# Patient Record
Sex: Female | Born: 1950
Health system: Southern US, Community
[De-identification: ages and names within clinical notes are randomized; demographics above are authoritative.]

## PROBLEM LIST (undated history)

## (undated) DIAGNOSIS — R569 Unspecified convulsions: Secondary | ICD-10-CM

## (undated) DIAGNOSIS — F329 Major depressive disorder, single episode, unspecified: Secondary | ICD-10-CM

## (undated) DIAGNOSIS — F32A Depression, unspecified: Secondary | ICD-10-CM

## (undated) DIAGNOSIS — I10 Essential (primary) hypertension: Secondary | ICD-10-CM

## (undated) DIAGNOSIS — E079 Disorder of thyroid, unspecified: Secondary | ICD-10-CM

## (undated) DIAGNOSIS — E785 Hyperlipidemia, unspecified: Secondary | ICD-10-CM

## (undated) DIAGNOSIS — J449 Chronic obstructive pulmonary disease, unspecified: Secondary | ICD-10-CM

## (undated) HISTORY — DX: Unspecified convulsions: R56.9

## (undated) HISTORY — DX: Hyperlipidemia, unspecified: E78.5

## (undated) HISTORY — DX: Essential (primary) hypertension: I10

## (undated) HISTORY — PX: BRAIN SURGERY: SHX531

## (undated) HISTORY — DX: Disorder of thyroid, unspecified: E07.9

## (undated) HISTORY — DX: Major depressive disorder, single episode, unspecified: F32.9

## (undated) HISTORY — DX: Depression, unspecified: F32.A

## (undated) HISTORY — DX: Chronic obstructive pulmonary disease, unspecified: J44.9

---

## 2007-04-22 ENCOUNTER — Ambulatory Visit (HOSPITAL_COMMUNITY): Payer: Self-pay | Admitting: Psychiatry

## 2007-05-15 ENCOUNTER — Ambulatory Visit (HOSPITAL_COMMUNITY): Payer: Self-pay | Admitting: Psychiatry

## 2007-07-01 ENCOUNTER — Ambulatory Visit (HOSPITAL_COMMUNITY): Payer: Self-pay | Admitting: Psychiatry

## 2007-07-31 ENCOUNTER — Ambulatory Visit (HOSPITAL_COMMUNITY): Payer: Self-pay | Admitting: Psychiatry

## 2007-09-04 ENCOUNTER — Ambulatory Visit (HOSPITAL_COMMUNITY): Payer: Self-pay | Admitting: Psychiatry

## 2007-10-07 ENCOUNTER — Ambulatory Visit (HOSPITAL_COMMUNITY): Payer: Self-pay | Admitting: Psychiatry

## 2007-11-16 ENCOUNTER — Ambulatory Visit (HOSPITAL_COMMUNITY): Payer: Self-pay | Admitting: Psychiatry

## 2008-01-18 ENCOUNTER — Ambulatory Visit (HOSPITAL_COMMUNITY): Payer: Self-pay | Admitting: Psychiatry

## 2008-03-21 ENCOUNTER — Ambulatory Visit (HOSPITAL_COMMUNITY): Payer: Self-pay | Admitting: Psychiatry

## 2008-05-18 ENCOUNTER — Ambulatory Visit (HOSPITAL_COMMUNITY): Payer: Self-pay | Admitting: Psychiatry

## 2008-09-30 ENCOUNTER — Ambulatory Visit (HOSPITAL_COMMUNITY): Payer: Self-pay | Admitting: Psychiatry

## 2008-12-30 ENCOUNTER — Ambulatory Visit (HOSPITAL_COMMUNITY): Payer: Self-pay | Admitting: Psychiatry

## 2009-04-05 ENCOUNTER — Ambulatory Visit (HOSPITAL_COMMUNITY): Payer: Self-pay | Admitting: Psychiatry

## 2009-07-05 ENCOUNTER — Ambulatory Visit (HOSPITAL_COMMUNITY): Payer: Self-pay | Admitting: Psychiatry

## 2009-10-04 ENCOUNTER — Ambulatory Visit (HOSPITAL_COMMUNITY): Payer: Self-pay | Admitting: Psychiatry

## 2009-11-01 ENCOUNTER — Ambulatory Visit (HOSPITAL_COMMUNITY): Payer: Self-pay | Admitting: Psychiatry

## 2010-01-01 ENCOUNTER — Ambulatory Visit (HOSPITAL_COMMUNITY): Payer: Self-pay | Admitting: Psychiatry

## 2010-03-12 ENCOUNTER — Ambulatory Visit (HOSPITAL_COMMUNITY): Payer: Self-pay | Admitting: Psychiatry

## 2010-05-07 ENCOUNTER — Ambulatory Visit (HOSPITAL_COMMUNITY): Payer: Self-pay | Admitting: Psychiatry

## 2010-08-15 ENCOUNTER — Ambulatory Visit (HOSPITAL_COMMUNITY): Payer: Self-pay | Admitting: Psychiatry

## 2010-11-05 ENCOUNTER — Encounter (HOSPITAL_COMMUNITY): Payer: Self-pay | Admitting: Psychiatry

## 2010-11-05 DIAGNOSIS — F3189 Other bipolar disorder: Secondary | ICD-10-CM

## 2011-02-04 ENCOUNTER — Encounter (HOSPITAL_COMMUNITY): Payer: Self-pay | Admitting: Psychiatry

## 2011-02-04 DIAGNOSIS — F3189 Other bipolar disorder: Secondary | ICD-10-CM

## 2011-05-08 ENCOUNTER — Encounter (HOSPITAL_COMMUNITY): Payer: Self-pay | Admitting: Psychiatry

## 2011-05-08 DIAGNOSIS — F3189 Other bipolar disorder: Secondary | ICD-10-CM

## 2011-07-30 ENCOUNTER — Encounter (HOSPITAL_COMMUNITY): Payer: Self-pay | Admitting: Psychology

## 2011-07-31 ENCOUNTER — Ambulatory Visit (HOSPITAL_COMMUNITY): Payer: Self-pay | Admitting: Psychiatry

## 2011-07-31 ENCOUNTER — Encounter (HOSPITAL_COMMUNITY): Payer: Self-pay | Admitting: Psychiatry

## 2011-07-31 DIAGNOSIS — F331 Major depressive disorder, recurrent, moderate: Secondary | ICD-10-CM

## 2011-07-31 MED ORDER — CITALOPRAM HYDROBROMIDE 40 MG PO TABS: 40.0000 mg | ORAL_TABLET | Freq: Every day | ORAL | Status: DC

## 2011-07-31 NOTE — Progress Notes (Signed)
Patient came for her followup appointment she's been taking her medication on a regular basis. She reported no side effects of medication her depression has been stable. Recently she has seen primary care doctor and happy about her blood work which come out normal. She is excited as husband going to [place] in [place] the business trip. Her husband has been involved in [employer] and recently having significant financial issues. Patient sleeping fine, her agitation and anxiety is also controlled. She reported no crying spells or anhedonia. Mental status examination. Patient is pleasant cooperative and groomed she maintained good eye contact her speech is soft clear and coherent she describes her mood is anxious and her affect was mood congruent. She denies any active or passive suicidal thinking homicidal thinking. There were no psychotic symptoms. She is alert and oriented x3. Her attention and concentration is okay. Her insight judgment and impulse control is okay. Assessment Maj. depressive disorder rule out bipolar disorder Plan we'll continue her Celexa, Remeron, lithium and Seroquel. A new prescription of Celexa 40 mg given today for 90 days. I will see her again in 3 months.

## 2011-09-12 ENCOUNTER — Other Ambulatory Visit (HOSPITAL_COMMUNITY): Payer: Self-pay | Admitting: Psychiatry

## 2011-09-12 DIAGNOSIS — F332 Major depressive disorder, recurrent severe without psychotic features: Secondary | ICD-10-CM

## 2011-09-12 MED ORDER — MIRTAZAPINE 30 MG PO TABS: 30.0000 mg | ORAL_TABLET | Freq: Every day | ORAL | Status: DC

## 2011-09-12 MED ORDER — LITHIUM CARBONATE ER 450 MG PO TBCR: 450.0000 mg | EXTENDED_RELEASE_TABLET | Freq: Every day | ORAL | Status: DC

## 2011-11-04 ENCOUNTER — Ambulatory Visit (HOSPITAL_COMMUNITY): Payer: Self-pay | Admitting: Psychiatry

## 2011-11-08 ENCOUNTER — Other Ambulatory Visit (HOSPITAL_COMMUNITY): Payer: Self-pay | Admitting: Psychology

## 2011-11-08 DIAGNOSIS — F331 Major depressive disorder, recurrent, moderate: Secondary | ICD-10-CM

## 2011-11-08 MED ORDER — CITALOPRAM HYDROBROMIDE 40 MG PO TABS: 40.0000 mg | ORAL_TABLET | Freq: Every day | ORAL | Status: DC

## 2011-11-11 ENCOUNTER — Other Ambulatory Visit (HOSPITAL_COMMUNITY): Payer: Self-pay | Admitting: *Deleted

## 2011-11-11 DIAGNOSIS — F331 Major depressive disorder, recurrent, moderate: Secondary | ICD-10-CM

## 2011-11-11 MED ORDER — CITALOPRAM HYDROBROMIDE 40 MG PO TABS: 40.0000 mg | ORAL_TABLET | Freq: Every day | ORAL | Status: DC

## 2011-11-18 ENCOUNTER — Ambulatory Visit (INDEPENDENT_AMBULATORY_CARE_PROVIDER_SITE_OTHER): Payer: Self-pay | Admitting: Psychiatry

## 2011-11-18 ENCOUNTER — Encounter (HOSPITAL_COMMUNITY): Payer: Self-pay | Admitting: Psychiatry

## 2011-11-18 DIAGNOSIS — F329 Major depressive disorder, single episode, unspecified: Secondary | ICD-10-CM | POA: Insufficient documentation

## 2011-11-18 DIAGNOSIS — Z79899 Other long term (current) drug therapy: Secondary | ICD-10-CM

## 2011-11-18 DIAGNOSIS — F332 Major depressive disorder, recurrent severe without psychotic features: Secondary | ICD-10-CM

## 2011-11-18 DIAGNOSIS — F32A Depression, unspecified: Secondary | ICD-10-CM | POA: Insufficient documentation

## 2011-11-18 DIAGNOSIS — F3289 Other specified depressive episodes: Secondary | ICD-10-CM

## 2011-11-18 MED ORDER — LITHIUM CARBONATE ER 300 MG PO TBCR: EXTENDED_RELEASE_TABLET | ORAL | Status: DC

## 2011-11-18 NOTE — Progress Notes (Signed)
Chief complaint I'm feeling more depressed and having mood swings  History of presenting illness Patient is 61 year old Caucasian married unemployed female who came for her followup appointment. Patient is compliant with her psychiatric medication and reported no side effects. Recently she has notice more anxiety nervousness insomnia and some mood swings. Though she denies any active or passive suicidal thinking but admitted social isolation and anhedonia and limited involvement in her life. Her husband is working with her son however they still have financial stress and burden. Patient is wondering if medication can be adjusted. Patient was taking higher dose of lithium however she had a stop last year. Her last lithium level was in May 2012.  Current psychiatric medication Seroquel 300 mg at bedtime from pharmaceutical company Remeron 30 mg at bedtime Lithium ER 450 mg at bedtime Celexa 40 mg daily  Medical history Patient has brain tumor status post surgery. She was given medication for her seizures but now stopped.   Psychosocial history Patient lives with her husband and her son.  Mental status examination Patient is casually dressed and fairly groomed. She described her mood is anxious and nervous. She maintained fair eye contact. She denies any active or passive suicidal thinking and homicidal thinking. She denies any auditory or visual hallucination. There no psychotic symptoms present. Her thought process is slow but logical linear and goal-directed. She's alert and oriented x3. Her insight judgment and impulse control.  Assessment Axis I bipolar disorder, Major depressive disorder with psychotic features Axis II deferred Axis III see medical history Axis IV mild to moderate Axis V 60-65  Plan I will increase her lithium to 600 mg. I will also get her lithium level along with hemoglobin A1c CBC and CMP. I have explained risks and benefits of medication. She will continue  Seroquel Remeron and Celexa. I will see her again in 2 months. Time spent 30 minutes

## 2011-11-19 ENCOUNTER — Other Ambulatory Visit (HOSPITAL_COMMUNITY): Payer: Self-pay | Admitting: Psychology

## 2011-12-06 ENCOUNTER — Other Ambulatory Visit (HOSPITAL_COMMUNITY): Payer: Self-pay | Admitting: *Deleted

## 2011-12-06 DIAGNOSIS — F332 Major depressive disorder, recurrent severe without psychotic features: Secondary | ICD-10-CM

## 2011-12-06 MED ORDER — MIRTAZAPINE 30 MG PO TABS: 30.0000 mg | ORAL_TABLET | Freq: Every day | ORAL | Status: DC

## 2011-12-18 ENCOUNTER — Encounter (HOSPITAL_COMMUNITY): Payer: Self-pay | Admitting: *Deleted

## 2011-12-18 NOTE — Progress Notes (Signed)
Sent new paper RX signed by Dr.Arfeen for Seroquel 300 mg, 2 at HS to [facility] as requested by company. Information placed in clinic chart.

## 2011-12-27 LAB — COMPREHENSIVE METABOLIC PANEL
ALT: 20 U/L (ref 0–35)
AST: 22 U/L (ref 0–37)
Alkaline Phosphatase: 78 U/L (ref 39–117)
Potassium: 4.6 mEq/L (ref 3.5–5.3)
Sodium: 140 mEq/L (ref 135–145)
Total Bilirubin: 0.3 mg/dL (ref 0.3–1.2)
Total Protein: 6.5 g/dL (ref 6.0–8.3)

## 2011-12-27 LAB — CBC WITH DIFFERENTIAL/PLATELET
Basophils Absolute: 0 10*3/uL (ref 0.0–0.1)
Basophils Relative: 0 % (ref 0–1)
Eosinophils Absolute: 0 10*3/uL (ref 0.0–0.7)
MCH: 31.4 pg (ref 26.0–34.0)
MCHC: 31.7 g/dL (ref 30.0–36.0)
Neutro Abs: 4 10*3/uL (ref 1.7–7.7)
Neutrophils Relative %: 70 % (ref 43–77)
Platelets: 146 10*3/uL — ABNORMAL LOW (ref 150–400)
RBC: 4.78 MIL/uL (ref 3.87–5.11)

## 2011-12-27 LAB — HEMOGLOBIN A1C
Hgb A1c MFr Bld: 6.6 % — ABNORMAL HIGH (ref <5.7)
Mean Plasma Glucose: 143 mg/dL — ABNORMAL HIGH (ref <117)

## 2011-12-30 ENCOUNTER — Telehealth (HOSPITAL_COMMUNITY): Payer: Self-pay | Admitting: *Deleted

## 2011-12-30 NOTE — Telephone Encounter (Signed)
Instructed patient to contact office to review results of lab work drawn 12/26/11, per Dr.Arfeen's request.

## 2012-01-06 ENCOUNTER — Ambulatory Visit (INDEPENDENT_AMBULATORY_CARE_PROVIDER_SITE_OTHER): Payer: Self-pay | Admitting: Psychiatry

## 2012-01-06 ENCOUNTER — Encounter (HOSPITAL_COMMUNITY): Payer: Self-pay | Admitting: Psychiatry

## 2012-01-06 DIAGNOSIS — F332 Major depressive disorder, recurrent severe without psychotic features: Secondary | ICD-10-CM

## 2012-01-06 MED ORDER — LITHIUM CARBONATE ER 300 MG PO TBCR: EXTENDED_RELEASE_TABLET | ORAL | Status: DC

## 2012-01-06 NOTE — Progress Notes (Signed)
Chief complaint I am doing better on my medication.    History of presenting illness Patient is 61 year old Caucasian married unemployed female who came for her followup appointment. Patient is compliant with her psychiatric medication and reported no side effects.  On her last visit we have increased lithium to 600 mg at bedtime.  She is less anxious and less depressed.  She is sleeping much better .  She denies any agitation anger or mood swings.  She feels her medicines are working very well.  I reviewed her blood past .  Her lithium level is 0.59 and her hemoglobin A1c is 6.6.  Patient denies any history of diabetes .  She has not seen her primary care physician in a while.  However she is schedule to see him soon as she complaining of allergies and require medication .  Patient has any tremors or shakes .  Current psychiatric medication Seroquel 600 mg at bedtime from pharmaceutical company Remeron 30 mg at bedtime Lithium ER 600 mg at bedtime Celexa 40 mg daily  Medical history Patient has brain tumor status post surgery. She was given medication for her seizures but now stopped.  She has history of hypertension, hyperlipidemia and thyroid disease.  Her primary care physician is Dr. Lebron Quam in [facility].  Psychosocial history Patient lives with her husband and her son.  Mental status examination Patient is casually dressed and fairly groomed. She is pleasant, calm and cooperative.  She maintained good eye contact.  Her speech is clear and coherent.  Her thought process logical linear and goal-directed.  She denies any active or passive suicidal thinking and homicidal thinking.  She denies any auditory or visual hallucination.  She described her mood is good and her affect is mood congruent. There no psychotic symptoms present. Her thought process is slow but logical linear and goal-directed. She's alert and oriented x3. Her insight judgment and impulse control.  Assessment Axis I bipolar  disorder, Major depressive disorder with psychotic features Axis II deferred Axis III see medical history Axis IV mild to moderate Axis V 60-65  Plan I discussed in detail about her increase hemoglobin A1c and blood sugar level .  I recommended to contact her primary care physician to discuss further if she requires medication to control her blood sugar.  I also discussed that we can consider lowering Seroquel as this medicine can also increase blood sugar however patient told that she want to see her primary care physician first and then let us know about her reduction of Seroquel .  At this time we will continue her current dose.  I have given cochlea blood results to the patient so that she can take with her to see primary care physician .  I recommended to call us if she is in a question or concern about the medication.  She wants 90 day supply of lithium .  She still has remained he feels on Celexa and Remeron .  I will see her again in 3 months.

## 2012-01-23 ENCOUNTER — Telehealth (HOSPITAL_COMMUNITY): Payer: Self-pay

## 2012-01-23 NOTE — Telephone Encounter (Signed)
3:59PM 01/23/12 PT'S HUSBAND CAME TO PICK-UP MEDICATION SEROQUEL.

## 2012-02-06 ENCOUNTER — Other Ambulatory Visit (HOSPITAL_COMMUNITY): Payer: Self-pay | Admitting: *Deleted

## 2012-02-06 DIAGNOSIS — F331 Major depressive disorder, recurrent, moderate: Secondary | ICD-10-CM

## 2012-02-06 MED ORDER — CITALOPRAM HYDROBROMIDE 40 MG PO TABS: 40.0000 mg | ORAL_TABLET | Freq: Every day | ORAL | Status: DC

## 2012-03-04 ENCOUNTER — Other Ambulatory Visit (HOSPITAL_COMMUNITY): Payer: Self-pay

## 2012-03-04 DIAGNOSIS — F332 Major depressive disorder, recurrent severe without psychotic features: Secondary | ICD-10-CM

## 2012-03-04 MED ORDER — MIRTAZAPINE 30 MG PO TABS: 30.0000 mg | ORAL_TABLET | Freq: Every day | ORAL | Status: DC

## 2012-03-09 ENCOUNTER — Other Ambulatory Visit (HOSPITAL_COMMUNITY): Payer: Self-pay | Admitting: *Deleted

## 2012-03-09 DIAGNOSIS — F332 Major depressive disorder, recurrent severe without psychotic features: Secondary | ICD-10-CM

## 2012-03-09 MED ORDER — MIRTAZAPINE 30 MG PO TABS: 30.0000 mg | ORAL_TABLET | Freq: Every day | ORAL | Status: DC

## 2012-03-09 NOTE — Telephone Encounter (Signed)
Per Pharmacy, they did did not receive message from 03/04/12 to refill med. Refilled today by phone.

## 2012-03-16 ENCOUNTER — Telehealth (HOSPITAL_COMMUNITY): Payer: Self-pay

## 2012-03-16 NOTE — Telephone Encounter (Signed)
4:01pm 03/16/12 called left msg that medication (seroquel 300mg  is ready for pick-up./sh

## 2012-04-06 ENCOUNTER — Encounter (HOSPITAL_COMMUNITY): Payer: Self-pay | Admitting: *Deleted

## 2012-04-06 ENCOUNTER — Encounter (HOSPITAL_COMMUNITY): Payer: Self-pay | Admitting: Psychiatry

## 2012-04-06 ENCOUNTER — Ambulatory Visit (INDEPENDENT_AMBULATORY_CARE_PROVIDER_SITE_OTHER): Payer: Self-pay | Admitting: Psychiatry

## 2012-04-06 VITALS — BP 128/85 | HR 93 | Wt 190.2 lb

## 2012-04-06 DIAGNOSIS — F323 Major depressive disorder, single episode, severe with psychotic features: Secondary | ICD-10-CM

## 2012-04-06 DIAGNOSIS — F332 Major depressive disorder, recurrent severe without psychotic features: Secondary | ICD-10-CM

## 2012-04-06 DIAGNOSIS — F319 Bipolar disorder, unspecified: Secondary | ICD-10-CM

## 2012-04-06 MED ORDER — LITHIUM CARBONATE ER 300 MG PO TBCR: EXTENDED_RELEASE_TABLET | ORAL | Status: DC

## 2012-04-06 NOTE — Progress Notes (Signed)
Chief complaint I'm feeling better.      History of presenting illness Patient is 61 year old Caucasian married unemployed female who came for her followup appointment.  On her last visit we have discussed her blood results and recommend to see primary care physician for high hemoglobin A1c.  Patient is scheduled to see his primary care physician Dr. Lebron Quam on July 19 .  She's compliant with her psychiatric medication reported no side effects.  She denies any agitation anger or mood swing.  She sleeping better .  Her weight remains unchanged.  Patient denies any tremors shakes or any side effects of medication.  She denies any paranoia or any hallucination.  She's not drinking or using any illegal substance.  Current psychiatric medication Seroquel 600 mg at bedtime from pharmaceutical company Remeron 30 mg at bedtime Lithium ER 600 mg at bedtime Celexa 40 mg daily  Medical history Patient has brain tumor status post surgery. She was given medication for her seizures but now stopped.  She has history of hypertension, hyperlipidemia and thyroid disease.  Her primary care physician is Dr. Lebron Quam in [facility].  She will see him again in July 19.  Psychosocial history Patient lives with her husband and her son.  Mental status examination Patient is casually dressed and fairly groomed. She is pleasant, calm and cooperative.  She maintained good eye contact.  Her speech is clear and coherent.  Her thought process logical linear and goal-directed.  She denies any active or passive suicidal thinking and homicidal thinking.  She denies any auditory or visual hallucination.  She described her mood is good and her affect is mood congruent. There no psychotic symptoms present. Her thought process is slow but logical linear and goal-directed.  There were no flight of idea or loose association.  There were no tremors or shakes. She's alert and oriented x3. Her insight judgment and impulse  control.  Assessment Axis I bipolar disorder, Major depressive disorder with psychotic features Axis II deferred Axis III see medical history Axis IV mild to moderate Axis V 60-65  Plan I recommend to try Seroquel 300 mg one a half tablet daily the concern she has high hemoglobin A1c and did not able to lose weight.  Patient agreed with the plan however we will continue to prescribe 600 if patient is started to decompensate on lower dose.  I will continue her Remeron Celexa and lithium at present does.  Her last lithium level was 0.59.  I explained the risks and benefits of medication in detail.  I recommend to call us if she has any question or concern about the medication or if she feels worsening of the symptoms.  She will require a new prescription of Seroquel but she has been getting from [place].  I strongly encouraged to keep appointment with her primary care physician and recommend regular exercise and watching her calorie intake.  I will see her again in 3 months.  Time spent 30 minutes.  Portion of this note is generated with voice dictation software and may contain typographical error.

## 2012-04-15 ENCOUNTER — Other Ambulatory Visit (HOSPITAL_COMMUNITY): Payer: Self-pay | Admitting: Psychiatry

## 2012-06-04 ENCOUNTER — Other Ambulatory Visit (HOSPITAL_COMMUNITY): Payer: Self-pay | Admitting: *Deleted

## 2012-06-04 DIAGNOSIS — F332 Major depressive disorder, recurrent severe without psychotic features: Secondary | ICD-10-CM

## 2012-06-04 MED ORDER — MIRTAZAPINE 30 MG PO TABS: 30.0000 mg | ORAL_TABLET | Freq: Every day | ORAL | Status: DC

## 2012-06-10 ENCOUNTER — Other Ambulatory Visit (HOSPITAL_COMMUNITY): Payer: Self-pay | Admitting: *Deleted

## 2012-06-10 DIAGNOSIS — F332 Major depressive disorder, recurrent severe without psychotic features: Secondary | ICD-10-CM

## 2012-06-10 MED ORDER — MIRTAZAPINE 30 MG PO TABS: 30.0000 mg | ORAL_TABLET | Freq: Every day | ORAL | Status: DC

## 2012-06-10 NOTE — Telephone Encounter (Signed)
VM: Refill requested for Mirtazapine.Was called to [facility] a week ago and they have not heard from [place]. RX was entered into EPIC on 9/12, to send electronically. EPIC shows "Phoned In". Resent to [facility]

## 2012-07-08 ENCOUNTER — Ambulatory Visit (INDEPENDENT_AMBULATORY_CARE_PROVIDER_SITE_OTHER): Payer: Self-pay | Admitting: Psychiatry

## 2012-07-08 ENCOUNTER — Encounter (HOSPITAL_COMMUNITY): Payer: Self-pay | Admitting: Psychiatry

## 2012-07-08 DIAGNOSIS — F319 Bipolar disorder, unspecified: Secondary | ICD-10-CM

## 2012-07-08 DIAGNOSIS — F331 Major depressive disorder, recurrent, moderate: Secondary | ICD-10-CM

## 2012-07-08 DIAGNOSIS — F322 Major depressive disorder, single episode, severe without psychotic features: Secondary | ICD-10-CM

## 2012-07-08 DIAGNOSIS — F332 Major depressive disorder, recurrent severe without psychotic features: Secondary | ICD-10-CM

## 2012-07-08 MED ORDER — CITALOPRAM HYDROBROMIDE 40 MG PO TABS: 40.0000 mg | ORAL_TABLET | Freq: Every day | ORAL | Status: DC

## 2012-07-08 MED ORDER — LITHIUM CARBONATE ER 300 MG PO TBCR: EXTENDED_RELEASE_TABLET | ORAL | Status: DC

## 2012-07-08 NOTE — Progress Notes (Signed)
Chief complaint I'm taking Seroquel 450 mg.  I'm sleeping better.       History of presenting illness Patient is 61 year old Caucasian married unemployed female who came for her followup appointment.  On her last visit I recommended to take Seroquel one half tablet due to her high hemoglobin A1c.  She was concern lowering the dose however she does not see any bad outcome with lowering Seroquel.  She denies any agitation anger mood swing.  She has any side effects of medication.  She is concerned about her husband was been drinking.  Patient denies any paranoia or any hallucination.  She denies any crying spells.  She's not drinking or using any illegal substance.  Current psychiatric medication Seroquel 300 mg 11/2 at bedtime from pharmaceutical company Remeron 30 mg at bedtime Lithium ER 600 mg at bedtime Celexa 40 mg daily  Medical history Patient has brain tumor status post surgery. She was given medication for her seizures but now stopped.  She has history of hypertension, hyperlipidemia and thyroid disease.  Her primary care physician is Dr. Lebron Quam in [facility].   Psychosocial history Patient lives with her husband and her son.  Mental status examination Patient is casually dressed and fairly groomed. She is pleasant, calm and cooperative.  She maintained good eye contact.  Her speech is soft, clear and coherent.  Her thought process logical linear and goal-directed.  She denies any active or passive suicidal thinking and homicidal thinking.  She denies any auditory or visual hallucination.  She described her mood is is okay.  Her affect is mood appropriate.  There no psychotic symptoms present. Her thought process is slow but logical linear and goal-directed.  There were no flight of idea or loose association.  There were no tremors or shakes. She's alert and oriented x3. Her insight judgment and impulse control.  Assessment Axis I bipolar disorder, Major depressive disorder with  psychotic features Axis II deferred Axis III see medical history Axis IV mild to moderate Axis V 60-65  Plan I will continue her current psychiatric medication.  We will do another hemoglobin A1c in 3 months to see if lowering Seroquel is helping her sugar.  She will continue lithium Remeron and Celexa at present does.  I recommend to call us if she is any question or concern about the medication if she feels worsening of the symptom.  I will see her again in 3 months.  Portion of this note is generated with voice dictation software and may contain typographical error.

## 2012-09-01 ENCOUNTER — Other Ambulatory Visit (HOSPITAL_COMMUNITY): Payer: Self-pay | Admitting: *Deleted

## 2012-09-01 DIAGNOSIS — F332 Major depressive disorder, recurrent severe without psychotic features: Secondary | ICD-10-CM

## 2012-09-01 MED ORDER — MIRTAZAPINE 30 MG PO TABS: 30.0000 mg | ORAL_TABLET | Freq: Every day | ORAL | Status: DC

## 2012-09-02 ENCOUNTER — Encounter: Payer: Self-pay | Admitting: Family

## 2012-09-02 ENCOUNTER — Ambulatory Visit (INDEPENDENT_AMBULATORY_CARE_PROVIDER_SITE_OTHER): Payer: No Typology Code available for payment source | Admitting: Family

## 2012-09-02 VITALS — BP 124/84 | HR 79 | Temp 98.6°F | Resp 18 | Ht 69.0 in | Wt 186.1 lb

## 2012-09-02 DIAGNOSIS — I10 Essential (primary) hypertension: Secondary | ICD-10-CM

## 2012-09-02 DIAGNOSIS — K219 Gastro-esophageal reflux disease without esophagitis: Secondary | ICD-10-CM

## 2012-09-02 DIAGNOSIS — F329 Major depressive disorder, single episode, unspecified: Secondary | ICD-10-CM

## 2012-09-02 DIAGNOSIS — E119 Type 2 diabetes mellitus without complications: Secondary | ICD-10-CM

## 2012-09-02 DIAGNOSIS — E785 Hyperlipidemia, unspecified: Secondary | ICD-10-CM

## 2012-09-02 DIAGNOSIS — E039 Hypothyroidism, unspecified: Secondary | ICD-10-CM

## 2012-09-02 DIAGNOSIS — F3289 Other specified depressive episodes: Secondary | ICD-10-CM

## 2012-09-02 DIAGNOSIS — Z720 Tobacco use: Secondary | ICD-10-CM

## 2012-09-02 DIAGNOSIS — F17200 Nicotine dependence, unspecified, uncomplicated: Secondary | ICD-10-CM

## 2012-09-02 DIAGNOSIS — F32A Depression, unspecified: Secondary | ICD-10-CM

## 2012-09-02 DIAGNOSIS — Z23 Encounter for immunization: Secondary | ICD-10-CM

## 2012-09-02 DIAGNOSIS — Z0000 Encounter for general adult medical examination without abnormal findings: Secondary | ICD-10-CM

## 2012-09-02 LAB — LIPID PANEL
Cholesterol: 174 mg/dL (ref 0–200)
HDL: 38 mg/dL — ABNORMAL LOW
LDL Cholesterol: 107 mg/dL — ABNORMAL HIGH (ref 0–99)
Total CHOL/HDL Ratio: 4.6 ratio
Triglycerides: 146 mg/dL
VLDL: 29 mg/dL (ref 0–40)

## 2012-09-02 LAB — BASIC METABOLIC PANEL WITH GFR
CO2: 27 mEq/L (ref 19–32)
Calcium: 9.3 mg/dL (ref 8.4–10.5)
Creat: 0.86 mg/dL (ref 0.50–1.10)
Glucose, Bld: 123 mg/dL — ABNORMAL HIGH (ref 70–99)

## 2012-09-02 LAB — HEMOGLOBIN A1C
Hgb A1c MFr Bld: 6.3 % — ABNORMAL HIGH
Mean Plasma Glucose: 134 mg/dL — ABNORMAL HIGH

## 2012-09-02 MED ORDER — ESOMEPRAZOLE MAGNESIUM 40 MG PO CPDR: 40.0000 mg | DELAYED_RELEASE_CAPSULE | Freq: Every day | ORAL | Status: DC

## 2012-09-02 MED ORDER — LISINOPRIL 5 MG PO TABS: 5.0000 mg | ORAL_TABLET | Freq: Every day | ORAL | Status: DC

## 2012-09-02 NOTE — Progress Notes (Signed)
Subjective:    Patient ID: Kathryn Buckley, female    DOB: 1951-03-25, 61 y.o.   MRN: [id number]  HPI  HTN- she is maintained on lisinopril.  She has been off meds x 3 weeks.   GERD- on nexium.  She reports hx of "severe reflux" which is well controlled. She gets this from [facility].    Bipolar disorder- follows with psychiatry.  Reports that this is well controlled.  She is in the process of weaning down on seroquel.    Hypothyroid-  She has been treated for hypothyroid for >20 years. She reports feeling well on her current dose of synthroid.   Hx brain tumor- caused by seizure- ws told that it was "cartilage".  Was removed.  Surgery was performed at [facility].  She denies any seizures since. She followed with neuro for 2 years following he surgery and was eventually taken off of meds.   Review of Systems  Constitutional:       Reports that weight has steadily increased over the last 4 yrs. Blames seroquel.  HENT: Negative for hearing loss.   Eyes: Negative for visual disturbance.  Respiratory: Positive for cough.   Cardiovascular: Negative for leg swelling.  Gastrointestinal: Negative for nausea, vomiting and diarrhea.  Genitourinary: Negative for dysuria and frequency.  Musculoskeletal:       Some right knee pain and left hip pain- chronic  Skin: Positive for rash.       Has rash on foot- using cream from derm.    Neurological: Negative for headaches.  Hematological: Negative for adenopathy.  Psychiatric/Behavioral:       See HPI   Past Medical History  Diagnosis Date  . Depression   . Thyroid disease   . HTN (hypertension)   . Hyperlipemia   . Seizures     Due to brain tumor that was removed in 2004    History   Social History  . Marital Status: Married    Spouse Name: N/A    Number of Children: N/A  . Years of Education: N/A   Occupational History  . Not on file.   Social History Main Topics  . Smoking status: Current Every Day Smoker -- 2.5 packs/day  for 40 years    Types: Cigarettes  . Smokeless tobacco: Not on file  . Alcohol Use: No  . Drug Use: No  . Sexually Active: Not on file   Other Topics Concern  . Not on file   Social History Narrative   Lives with husband and 2 chihuahuaShe has 2 children- one son died of drug overdose1 living son- [name]- lives in [place].  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    Past Surgical History  Procedure Date  . Brain surgery     Family History  Problem Relation Age of Onset  . Bipolar disorder Father   . Heart disease Father   . Cancer Father     prostate  . Heart disease Mother   . Hypertension Brother   . Diabetes Brother   . Stroke Neg Hx   . Hyperlipidemia Neg Hx     Allergies  Allergen Reactions  . Lamictal (Lamotrigine) Rash    Current Outpatient Prescriptions on File Prior to Visit  Medication Sig Dispense Refill  . citalopram (CELEXA) 40 MG tablet Take 1 tablet (40 mg total) by mouth daily.  90 tablet  0  . esomeprazole (NEXIUM) 40  MG capsule Take 1 capsule (40 mg total) by mouth daily before breakfast.  90 capsule  3  . lithium carbonate (LITHOBID) 300 MG CR tablet Take 2 at HS  180 tablet  0  . mirtazapine (REMERON) 30 MG tablet Take 1 tablet (30 mg total) by mouth at bedtime.  90 tablet  0  . QUEtiapine (SEROQUEL) 300 MG tablet Take 600 mg by mouth at bedtime.        Marland Kitchen levothyroxine (SYNTHROID, LEVOTHROID) 150 MCG tablet Take 1 tablet (150 mcg total) by mouth daily.  90 tablet  3  . lisinopril (PRINIVIL,ZESTRIL) 5 MG tablet Take 1 tablet (5 mg total) by mouth daily.  30 tablet  3    BP 124/84  Pulse 79  Temp 98.6 F (37 C) (Oral)  Resp 18  Ht 5\' 9"  (1.753 m)  Wt 186 lb 1.3 oz (84.405 kg)  BMI 27.48 kg/m2  SpO2 89%  LMP 09/23/1988        Objective:   Physical Exam  Constitutional: She is oriented to person, place, and time. She appears well-developed and well-nourished. No  distress.  HENT:  Head: Normocephalic and atraumatic.  Right Ear: Tympanic membrane and ear canal normal.  Left Ear: Tympanic membrane and ear canal normal.  Mouth/Throat: No oropharyngeal exudate, posterior oropharyngeal edema or posterior oropharyngeal erythema.  Cardiovascular: Normal rate and regular rhythm.   No murmur heard. Pulmonary/Chest: Effort normal and breath sounds normal. No respiratory distress. She has no wheezes. She has no rales. She exhibits no tenderness.  Abdominal: Soft. Bowel sounds are normal.  Musculoskeletal: She exhibits no edema.  Lymphadenopathy:    She has no cervical adenopathy.  Neurological: She is alert and oriented to person, place, and time.  Skin: Skin is warm and dry.  Psychiatric: She has a normal mood and affect. Her behavior is normal. Judgment and thought content normal.          Assessment & Plan:

## 2012-09-02 NOTE — Patient Instructions (Addendum)
Please follow up in 4 weeks for a fasting physical.   Welcome to [facility]!

## 2012-09-04 ENCOUNTER — Telehealth: Payer: Self-pay | Admitting: Family

## 2012-09-04 DIAGNOSIS — E039 Hypothyroidism, unspecified: Secondary | ICD-10-CM

## 2012-09-04 MED ORDER — LEVOTHYROXINE SODIUM 150 MCG PO TABS: 150.0000 ug | ORAL_TABLET | Freq: Every day | ORAL | Status: DC

## 2012-09-04 NOTE — Telephone Encounter (Signed)
Pls call pt and let her know that her labs show that we need to decrease thyroid medication. Will cut from 136mcg to 150. She should plan to repeat TSH in 6 weeks (hypothyroid).  Sugar appears well controlled and cholesterol is almost at goal. Keep working on low cholesterol diet/exercise.

## 2012-09-06 DIAGNOSIS — Z720 Tobacco use: Secondary | ICD-10-CM | POA: Insufficient documentation

## 2012-09-06 DIAGNOSIS — K219 Gastro-esophageal reflux disease without esophagitis: Secondary | ICD-10-CM | POA: Insufficient documentation

## 2012-09-06 DIAGNOSIS — E039 Hypothyroidism, unspecified: Secondary | ICD-10-CM | POA: Insufficient documentation

## 2012-09-06 DIAGNOSIS — I10 Essential (primary) hypertension: Secondary | ICD-10-CM | POA: Insufficient documentation

## 2012-09-06 NOTE — Assessment & Plan Note (Signed)
Pt was counseled on the importance of smoking cessation.

## 2012-09-06 NOTE — Assessment & Plan Note (Signed)
Stable on Nexium, continue same. 

## 2012-09-06 NOTE — Assessment & Plan Note (Signed)
Continue synthroid, obtain TSH.

## 2012-09-06 NOTE — Assessment & Plan Note (Signed)
BP Readings from Last 3 Encounters:  09/02/12 124/84  04/06/12 128/85  11/18/11 142/90   BP appears well controlled- continue low dose lisinopril.

## 2012-09-06 NOTE — Assessment & Plan Note (Signed)
Appears controlled. This is managed by psychiatry.

## 2012-09-08 NOTE — Telephone Encounter (Signed)
Left message on voicemail for pt to return my call.

## 2012-09-08 NOTE — Telephone Encounter (Signed)
Notified pt and she voices understanding. Future lab order entered and given to the lab.

## 2012-09-30 ENCOUNTER — Encounter: Payer: Self-pay | Admitting: Family

## 2012-10-02 ENCOUNTER — Encounter: Payer: Self-pay | Admitting: Family

## 2012-10-08 ENCOUNTER — Ambulatory Visit (INDEPENDENT_AMBULATORY_CARE_PROVIDER_SITE_OTHER): Payer: No Typology Code available for payment source | Admitting: Psychiatry

## 2012-10-08 ENCOUNTER — Encounter (HOSPITAL_COMMUNITY): Payer: Self-pay | Admitting: Psychiatry

## 2012-10-08 VITALS — BP 125/70 | HR 81 | Wt 189.0 lb

## 2012-10-08 DIAGNOSIS — F323 Major depressive disorder, single episode, severe with psychotic features: Secondary | ICD-10-CM

## 2012-10-08 DIAGNOSIS — F331 Major depressive disorder, recurrent, moderate: Secondary | ICD-10-CM

## 2012-10-08 DIAGNOSIS — F319 Bipolar disorder, unspecified: Secondary | ICD-10-CM

## 2012-10-08 DIAGNOSIS — Z79899 Other long term (current) drug therapy: Secondary | ICD-10-CM

## 2012-10-08 DIAGNOSIS — F332 Major depressive disorder, recurrent severe without psychotic features: Secondary | ICD-10-CM

## 2012-10-08 MED ORDER — MIRTAZAPINE 30 MG PO TABS: 30.0000 mg | ORAL_TABLET | Freq: Every day | ORAL | Status: DC

## 2012-10-08 MED ORDER — CITALOPRAM HYDROBROMIDE 40 MG PO TABS: 40.0000 mg | ORAL_TABLET | Freq: Every day | ORAL | Status: DC

## 2012-10-08 MED ORDER — LITHIUM CARBONATE ER 300 MG PO TBCR: EXTENDED_RELEASE_TABLET | ORAL | Status: DC

## 2012-10-08 MED ORDER — QUETIAPINE FUMARATE ER 400 MG PO TB24: 400.0000 mg | ORAL_TABLET | Freq: Every day | ORAL | Status: DC

## 2012-10-08 NOTE — Progress Notes (Signed)
Chief complaint I feel and I have no energy.  History of presenting illness Patient is 62 year old Caucasian married unemployed female who came for her followup appointment.  Patient has been noticing decreased energy and feeling tired.  She had a very quiet Christmas.  She admitted lack of motivation to do things.  She denies any crying spells or any depressive thoughts but she endorse decrease isolation and limited activities in her daily life.  Recently she has seen primary care physician and her blood work was done.  Her hemoglobin A1c is 6.3 .  She has gained weight from the past.  Her TSH was  low and her thyroid medicines were adjusted.  She still taking Seroquel 450 mg at bedtime.  She sleeps on and off .  She's not drinking or using any illegal substance however she continued to smokes heavy.  Current psychiatric medication Seroquel 300 mg 11/2 at bedtime from pharmaceutical company Remeron 30 mg at bedtime Lithium ER 600 mg at bedtime Celexa 40 mg daily  Past psychiatric history Patient has been seeing in this office since 2008.  She was not happy with her current treatment at [facility] .  She was seeing physician assistant for past few months and not comfortable.  Patient has long history of bipolar disorder.  She has tried in the past Lexapro Cymbalta and then Geodon.  Patient has history of mania at age 62 and she was admitted at [facility].  At that time she was treated with Paxil and then Zoloft .  However she's been taking lithium and Seroquel for a long time.  Patient denies any history of suicidal attempt .    Family history She endorse father and 2 other family member has bipolar disorder.    Medical history Patient has brain tumor status post surgery. She was given medication for her seizures but now stopped.  She has history of hypertension, hyperlipidemia and thyroid disease.  Her primary care physician is Dr. Lebron Quam in [facility].   Psychosocial history Patient lives  with her husband and her son.  Review of Systems  Constitutional: Positive for diaphoresis.       Weight gain  Musculoskeletal: Negative.   Psychiatric/Behavioral: Positive for depression. Negative for suicidal ideas, hallucinations and substance abuse. The patient is nervous/anxious and has insomnia.        Feel tired   Mental status examination Patient is casually dressed and fairly groomed. She is pleasant, calm and cooperative.  She maintained good eye contact.  Her speech is soft, clear and coherent.  Her thought process logical linear and goal-directed.  She denies any active or passive suicidal thinking and homicidal thinking.  She denies any auditory or visual hallucination.  She described her mood is  depressed and tired and her affect is mood appropriate.  There no psychotic symptoms present. Her thought process is slow but logical linear and goal-directed.  There were no flight of idea or loose association.  There were no tremors or shakes. She's alert and oriented x3. Her insight judgment and impulse control.  Assessment Axis I bipolar disorder, Major depressive disorder with psychotic features Axis II deferred Axis III see medical history Axis IV mild to moderate Axis V 60-65  Plan I review her blood results including hemoglobin A1c 6.3 which is better from the past but is still higher.  Recently her tired medicine has been adjusted.  We do not have any lithium level , I will order lithium level as patient may  need higher doses of lithium.  Patient is not interested or any antidepressant at this time.  I encourage her to continue current medication .  I also offer counseling but patient refused.  Patient like to try Seroquel XR.  She's getting Seroquel from [facility] but recently he became generic and patient is still cannot afford.  I explain in detail about these Seroquel extended release , side effects and benefits.  We will send refills to [facility] .  She'll  continue Remeron and Celexa at present does.  Risk and benefits of medication explain in detail.  Time spent 30 minutes.  I will see her again in 3 months.  More than 50% time spent in counseling, psychoeducation and coordination of care.    Portion of this note is generated with voice dictation software and may contain typographical error.

## 2012-10-12 ENCOUNTER — Ambulatory Visit (INDEPENDENT_AMBULATORY_CARE_PROVIDER_SITE_OTHER): Payer: No Typology Code available for payment source | Admitting: Family

## 2012-10-12 ENCOUNTER — Encounter: Payer: Self-pay | Admitting: Family

## 2012-10-12 VITALS — BP 118/80 | HR 82 | Temp 98.5°F | Resp 16 | Ht 69.0 in | Wt 190.0 lb

## 2012-10-12 DIAGNOSIS — Z0000 Encounter for general adult medical examination without abnormal findings: Secondary | ICD-10-CM

## 2012-10-12 DIAGNOSIS — Z23 Encounter for immunization: Secondary | ICD-10-CM

## 2012-10-12 DIAGNOSIS — R0902 Hypoxemia: Secondary | ICD-10-CM

## 2012-10-12 DIAGNOSIS — R9431 Abnormal electrocardiogram [ECG] [EKG]: Secondary | ICD-10-CM

## 2012-10-12 NOTE — Progress Notes (Signed)
Subjective:    Patient ID: Kathryn Buckley, female    DOB: November 25, 1950, 62 y.o.   MRN: [id number]  HPI  She started thyroid medication until 2 weeks ago.  Patient presents today for complete physical.  Immunizations:>10 yrs ago.   Diet:  Reports fair diet.  Exercise: Not exercising.  Getting motivated.   Colonoscopy: Never  Dexa: last dexa 2008 Pap Smear: s/p partial hysterectomy.  Mammogram: last one 2008      Review of Systems See HPI  Past Medical History  Diagnosis Date  . Depression   . Thyroid disease   . HTN (hypertension)   . Hyperlipemia   . Seizures     Due to brain tumor that was removed in 2004  . COPD (chronic obstructive pulmonary disease)     History   Social History  . Marital Status: Married    Spouse Name: N/A    Number of Children: N/A  . Years of Education: N/A   Occupational History  . Not on file.   Social History Main Topics  . Smoking status: Current Every Day Smoker -- 2.5 packs/day for 40 years    Types: Cigarettes  . Smokeless tobacco: Not on file  . Alcohol Use: No  . Drug Use: No  . Sexually Active: Not on file   Other Topics Concern  . Not on file   Social History Narrative   Lives with husband and 2 chihuahuaShe has 2 children- one son died of drug overdose1 living son- [name]- lives in [place].  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    Past Surgical History  Procedure Date  . Brain surgery     Family History  Problem Relation Age of Onset  . Bipolar disorder Father   . Heart disease Father   . Cancer Father     prostate  . Heart disease Mother   . Hypertension Brother   . Diabetes Brother   . Stroke Neg Hx   . Hyperlipidemia Neg Hx     Allergies  Allergen Reactions  . Lamictal (Lamotrigine) Rash    Current Outpatient Prescriptions on File Prior to Visit  Medication Sig Dispense Refill  . citalopram (CELEXA) 40 MG tablet Take 1  tablet (40 mg total) by mouth daily.  90 tablet  0  . esomeprazole (NEXIUM) 40 MG capsule Take 1 capsule (40 mg total) by mouth daily before breakfast.  90 capsule  3  . levothyroxine (SYNTHROID, LEVOTHROID) 150 MCG tablet Take 1 tablet (150 mcg total) by mouth daily.  90 tablet  3  . lisinopril (PRINIVIL,ZESTRIL) 5 MG tablet Take 1 tablet (5 mg total) by mouth daily.  30 tablet  3  . lithium carbonate (LITHOBID) 300 MG CR tablet Take 2 at HS  180 tablet  0  . mirtazapine (REMERON) 30 MG tablet Take 1 tablet (30 mg total) by mouth at bedtime.  90 tablet  0  . QUEtiapine (SEROQUEL XR) 400 MG 24 hr tablet Take 1 tablet (400 mg total) by mouth at bedtime.  90 tablet  0  . simvastatin (ZOCOR) 40 MG tablet Take 40 mg by mouth every evening.        BP 118/80  Pulse 82  Temp 98.5 F (36.9 C) (Oral)  Resp 16  Ht 5\' 9"  (1.753 m)  Wt 190 lb 0.6 oz (86.202 kg)  BMI 28.06 kg/m2  SpO2 87%  LMP 09/23/1988  Objective:   Physical Exam  Constitutional: She is oriented to person, place, and time. She appears well-developed and well-nourished. No distress.  HENT:  Head: Normocephalic and atraumatic.  Right Ear: Tympanic membrane and ear canal normal.  Left Ear: Tympanic membrane and ear canal normal.  Mouth/Throat: No posterior oropharyngeal edema.  Eyes: No scleral icterus.  Cardiovascular: Normal rate and regular rhythm.   No murmur heard. Pulmonary/Chest: Effort normal. No respiratory distress. She has decreased breath sounds. She has no rales. She exhibits no tenderness.  Musculoskeletal: She exhibits no edema.  Lymphadenopathy:    She has no cervical adenopathy.  Neurological: She is alert and oriented to person, place, and time.  Skin: Skin is warm and dry.       Ulcerated lesion noted beneath the left eye.    Psychiatric: She has a normal mood and affect. Her behavior is normal. Judgment and thought content normal.          Assessment & Plan:

## 2012-10-12 NOTE — Patient Instructions (Addendum)
Schedule your bone density test at the front desk. Please complete your blood work prior to leaving. Schedule mammogram on the first floor. You will be contacted about your referral for colonoscopy. Please let us know if you have not heard back within 1 week about your referral. Follow up in 4 months.

## 2012-10-13 ENCOUNTER — Telehealth: Payer: Self-pay | Admitting: Family

## 2012-10-13 DIAGNOSIS — R9431 Abnormal electrocardiogram [ECG] [EKG]: Secondary | ICD-10-CM

## 2012-10-13 LAB — LITHIUM LEVEL: Lithium Lvl: 0.6 mEq/L — ABNORMAL LOW (ref 0.80–1.40)

## 2012-10-13 NOTE — Telephone Encounter (Signed)
Please call patient and let her know that her EKG is abnormal and I recommend that she meet with cardiology for consult. They will likely recommend a stress test.  Pended below.

## 2012-10-13 NOTE — Telephone Encounter (Signed)
Left message for pt to return my call.

## 2012-10-15 ENCOUNTER — Telehealth: Payer: Self-pay | Admitting: Family

## 2012-10-15 DIAGNOSIS — R0902 Hypoxemia: Secondary | ICD-10-CM | POA: Insufficient documentation

## 2012-10-15 DIAGNOSIS — Z0000 Encounter for general adult medical examination without abnormal findings: Secondary | ICD-10-CM

## 2012-10-15 DIAGNOSIS — R9431 Abnormal electrocardiogram [ECG] [EKG]: Secondary | ICD-10-CM | POA: Insufficient documentation

## 2012-10-15 NOTE — Assessment & Plan Note (Addendum)
Pt counseled on healthy diet, exercise, weight loss and smoking cessation. Mammogram ordered. Tetanus today. Also ordered colo- but unfortunately GI does not take her insurance. Will instead have her complete IFOB for now and maybe at some point she can afford out of pocket colo or may have insurance.  See phone note.

## 2012-10-15 NOTE — Telephone Encounter (Signed)
Left message for pt to return my call.

## 2012-10-15 NOTE — Telephone Encounter (Signed)
Call to [facility]  To schedule colonoscopy    They do not except the [facility]  Discount

## 2012-10-15 NOTE — Telephone Encounter (Signed)
Pls let pt know that [facility] does not accept her [facility] discount.  We can have her complete an IFOB dx v70.0.  If she is able to pay cash at some point, then we can reschedule colo.

## 2012-10-15 NOTE — Assessment & Plan Note (Signed)
See phone note- will refer to cardiology for further evaluation.

## 2012-10-15 NOTE — Assessment & Plan Note (Signed)
I suspect severe underlying COPD and that this is her baseline.  Ambulated pt and oxygen dropped to 83% on RA.  Advised pt that I recommended oxygen but she refuses.

## 2012-10-16 NOTE — Telephone Encounter (Signed)
Left message to return my call on Monday and asked pt to call back if she gets voicemail and ask operator to page me to the phone.

## 2012-10-18 NOTE — Telephone Encounter (Signed)
When pt calls back please also remind her to complete labs ordered during her visit and let her know that we were unfortunately unable to arrange colonoscopy, as they do not accept the [facility] insurance.  For now, I would like her to complete IFOB.

## 2012-10-19 NOTE — Telephone Encounter (Signed)
Attempted to reach pt and received voicemail again; did not leave message. Mailed contact letter.

## 2012-10-22 NOTE — Telephone Encounter (Signed)
Notified pt, she will pick up IFOB tomorrow around 2pm. Also notified pt per 10/14/11 phone note of abnormal EKG and need to proceed with cardiology referral, pt is agreeable. Bonnita Nasuti, can you proceed with referral?

## 2012-10-23 ENCOUNTER — Ambulatory Visit (HOSPITAL_BASED_OUTPATIENT_CLINIC_OR_DEPARTMENT_OTHER)
Admission: RE | Admit: 2012-10-23 | Discharge: 2012-10-23 | Disposition: A | Discharge status: Home / Self Care | Payer: Self-pay | Source: Ambulatory Visit | Attending: Family | Admitting: Family

## 2012-10-23 ENCOUNTER — Other Ambulatory Visit: Payer: Self-pay | Admitting: Family

## 2012-10-23 DIAGNOSIS — R0902 Hypoxemia: Secondary | ICD-10-CM

## 2012-10-23 DIAGNOSIS — R059 Cough, unspecified: Secondary | ICD-10-CM | POA: Insufficient documentation

## 2012-10-23 DIAGNOSIS — Z1231 Encounter for screening mammogram for malignant neoplasm of breast: Secondary | ICD-10-CM

## 2012-10-23 DIAGNOSIS — R05 Cough: Secondary | ICD-10-CM | POA: Insufficient documentation

## 2012-10-23 NOTE — Telephone Encounter (Signed)
Pt picked up IFOB, order entered.

## 2012-10-23 NOTE — Addendum Note (Signed)
Addended by: Kelle Darting A on: 10/23/2012 02:08 PM   Modules accepted: Orders

## 2012-10-26 ENCOUNTER — Encounter: Payer: Self-pay | Admitting: Family

## 2012-11-03 ENCOUNTER — Encounter: Payer: Self-pay | Admitting: Psychiatry

## 2012-11-17 ENCOUNTER — Other Ambulatory Visit (INDEPENDENT_AMBULATORY_CARE_PROVIDER_SITE_OTHER): Payer: No Typology Code available for payment source

## 2012-11-17 DIAGNOSIS — Z0000 Encounter for general adult medical examination without abnormal findings: Secondary | ICD-10-CM

## 2012-11-17 LAB — FECAL OCCULT BLOOD, IMMUNOCHEMICAL: Fecal Occult Bld: NEGATIVE

## 2012-11-18 ENCOUNTER — Encounter: Payer: Self-pay | Admitting: Family

## 2012-11-18 ENCOUNTER — Inpatient Hospital Stay (HOSPITAL_BASED_OUTPATIENT_CLINIC_OR_DEPARTMENT_OTHER): Admission: RE | Admit: 2012-11-18 | Payer: Self-pay | Source: Ambulatory Visit

## 2012-11-18 ENCOUNTER — Ambulatory Visit: Payer: Self-pay | Admitting: Cardiology

## 2012-11-25 ENCOUNTER — Ambulatory Visit (HOSPITAL_BASED_OUTPATIENT_CLINIC_OR_DEPARTMENT_OTHER): Payer: Self-pay

## 2012-12-16 ENCOUNTER — Ambulatory Visit: Payer: Self-pay | Admitting: Cardiology

## 2012-12-16 ENCOUNTER — Ambulatory Visit (HOSPITAL_BASED_OUTPATIENT_CLINIC_OR_DEPARTMENT_OTHER): Payer: Self-pay

## 2012-12-21 ENCOUNTER — Other Ambulatory Visit (HOSPITAL_COMMUNITY): Payer: Self-pay | Admitting: *Deleted

## 2012-12-21 DIAGNOSIS — F332 Major depressive disorder, recurrent severe without psychotic features: Secondary | ICD-10-CM

## 2012-12-21 NOTE — Telephone Encounter (Signed)
Dr.Arfeen authorized 90 day supply with one refill to be faxed to [facility]

## 2012-12-22 MED ORDER — QUETIAPINE FUMARATE ER 400 MG PO TB24: 400.0000 mg | ORAL_TABLET | Freq: Every day | ORAL | Status: DC

## 2012-12-22 NOTE — Telephone Encounter (Signed)
New RX for Seroquel sent to [facility] Notified pt RX was sent

## 2012-12-23 ENCOUNTER — Ambulatory Visit: Payer: Self-pay | Admitting: Cardiology

## 2012-12-23 ENCOUNTER — Ambulatory Visit (HOSPITAL_BASED_OUTPATIENT_CLINIC_OR_DEPARTMENT_OTHER): Payer: Self-pay

## 2013-01-06 ENCOUNTER — Ambulatory Visit (HOSPITAL_COMMUNITY): Payer: Self-pay | Admitting: Psychiatry

## 2013-01-11 ENCOUNTER — Encounter (HOSPITAL_COMMUNITY): Payer: Self-pay

## 2013-01-12 ENCOUNTER — Ambulatory Visit (HOSPITAL_COMMUNITY): Payer: Self-pay | Admitting: Psychiatry

## 2013-01-28 ENCOUNTER — Telehealth (HOSPITAL_COMMUNITY): Payer: Self-pay | Admitting: *Deleted

## 2013-01-28 DIAGNOSIS — F332 Major depressive disorder, recurrent severe without psychotic features: Secondary | ICD-10-CM

## 2013-01-28 NOTE — Telephone Encounter (Signed)
Per pharmacist Marya Amsler at [facility], patient has been filling Lithium prescription (given 10/08/12) in varying quantities of 10 day to 30 day as patient pays cash and has no insurance. Pharmacist states a 20 day supply (last of prescription from 10/08/12) is ready for patient to pick up at this time.

## 2013-02-01 ENCOUNTER — Ambulatory Visit (INDEPENDENT_AMBULATORY_CARE_PROVIDER_SITE_OTHER): Payer: No Typology Code available for payment source | Admitting: Psychiatry

## 2013-02-01 ENCOUNTER — Encounter (HOSPITAL_COMMUNITY): Payer: Self-pay | Admitting: Psychiatry

## 2013-02-01 VITALS — BP 113/83 | HR 85 | Ht 68.5 in | Wt 182.0 lb

## 2013-02-01 DIAGNOSIS — F332 Major depressive disorder, recurrent severe without psychotic features: Secondary | ICD-10-CM

## 2013-02-01 DIAGNOSIS — F319 Bipolar disorder, unspecified: Secondary | ICD-10-CM

## 2013-02-01 MED ORDER — LITHIUM CARBONATE ER 300 MG PO TBCR: EXTENDED_RELEASE_TABLET | ORAL | Status: DC

## 2013-02-01 MED ORDER — CITALOPRAM HYDROBROMIDE 40 MG PO TABS: 40.0000 mg | ORAL_TABLET | Freq: Every day | ORAL | Status: DC

## 2013-02-01 NOTE — Progress Notes (Signed)
[facility] [phone] Progress Note  Teneika Goldblatt [id number] 62 y.o.  02/01/2013 3:02 PM  Chief Complaint:  My husband died.  I am feeling sometimes anxious.  History of Present Illness: Patient is 62 year old Caucasian female who came for her followup appointment.  Her husband died in 12/29/2022 due to massive heart failure.  Patient is struggling with the loss.  She has grief however she has not seen any grief counseling.  She is very busy taking care of finances and moving.  She is downsizing and moving into apartment.  She is very anxious about her packing and moving.  She admitted some time not sleep very well.  She's not taking Remeron since she could not afford.  However she does not want to take any other medication for sleep.  She likes Seroquel extended-release for her milligram at bedtime.  She denies any recent agitation anger or mood swing.  She denies any irritability or any crying spells.  She does not feel that she has time to see therapist however she promised that if she started to feel more depressed then she will call us.  She likes her current psychotic medication.  She denies any tremors or shakes.  She's not drinking and using any illegal substance.  Suicidal Ideation: No Plan Formed: No Patient has means to carry out plan: No  Homicidal Ideation: No Plan Formed: No Patient has means to carry out plan: No  Review of Systems  Constitutional: Positive for malaise/fatigue.  Respiratory: Negative.   Cardiovascular: Negative.   Musculoskeletal: Negative.   Neurological: Positive for weakness and headaches. Negative for dizziness, tingling, tremors and loss of consciousness.       History of brain tumor and surgery.  Psychiatric/Behavioral: Positive for depression. Negative for suicidal ideas, hallucinations, memory loss and substance abuse. The patient is nervous/anxious and has insomnia.    Psychiatric: Agitation: No Hallucination: No Depressed Mood:  Yes Insomnia: Yes Hypersomnia: No Altered Concentration: No Feels Worthless: No Grandiose Ideas: No Belief In Special Powers: No New/Increased Substance Abuse: No Compulsions: No  Neurologic: Headache: Yes Seizure: Patient has history of seizures after brain surgery.   Paresthesias: No  Medical History: Patient is seeing a Librarian, academic at [facility] clinic.  She has hypertension, hypothyroidism and hyperlipidemia.  She has history of brain tumor status post surgery.  She had seizures however there has been no recent seizure like activity.  Family history. Patient endorse father and 2 other family member has bipolar disorder.  Psychosocial history. Patient has a son.  Her husband died this 12/29/22 because of massive heart failure.  Outpatient Encounter Prescriptions as of 02/01/2013  Medication Sig Dispense Refill  . citalopram (CELEXA) 40 MG tablet Take 1 tablet (40 mg total) by mouth daily.  90 tablet  0  . esomeprazole (NEXIUM) 40 MG capsule Take 1 capsule (40 mg total) by mouth daily before breakfast.  90 capsule  3  . levothyroxine (SYNTHROID, LEVOTHROID) 150 MCG tablet Take 1 tablet (150 mcg total) by mouth daily.  90 tablet  3  . lisinopril (PRINIVIL,ZESTRIL) 5 MG tablet Take 1 tablet (5 mg total) by mouth daily.  30 tablet  3  . lithium carbonate (LITHOBID) 300 MG CR tablet Take 2 at HS  180 tablet  0  . QUEtiapine (SEROQUEL XR) 400 MG 24 hr tablet Take 1 tablet (400 mg total) by mouth at bedtime.  90 tablet  1  . simvastatin (ZOCOR) 40 MG tablet Take 40 mg by mouth every evening.      . [  DISCONTINUED] citalopram (CELEXA) 40 MG tablet Take 1 tablet (40 mg total) by mouth daily.  90 tablet  0  . [DISCONTINUED] lithium carbonate (LITHOBID) 300 MG CR tablet Take 2 at HS  180 tablet  0  . [DISCONTINUED] mirtazapine (REMERON) 30 MG tablet Take 1 tablet (30 mg total) by mouth at bedtime.  90 tablet  0   No facility-administered encounter medications on file as of 02/01/2013.     Past Psychiatric History/Hospitalization(s): Patient has been seeing in this office since 2008. She was not happy with her current treatment at [facility] . She was seeing physician assistant for past few months and not comfortable. Patient has long history of bipolar disorder. She has tried in the past Lexapro Cymbalta, Paxil , Zoloft and Geodon.  Patient denies any history of suicidal attempt .   Anxiety: Yes Bipolar Disorder: Yes Depression: Yes Mania: Yes Psychosis: No Schizophrenia: No Personality Disorder: No Hospitalization for psychiatric illness: Yes History of Electroconvulsive Shock Therapy: No Prior Suicide Attempts: No  Physical Exam: Constitutional:  BP 113/83  Pulse 85  Ht 5' 8.5" (1.74 m)  Wt 182 lb (82.555 kg)  BMI 27.27 kg/m2  LMP 09/23/1988  General Appearance: alert, oriented, no acute distress and well nourished  Musculoskeletal: Strength & Muscle Tone: within normal limits Gait & Station: normal Patient leans: N/A  Psychiatric: Speech (describe rate, volume, coherence, spontaneity, and abnormalities if any): Slow but clear and coherent with normal tone and volume.  Thought Process (describe rate, content, abstract reasoning, and computation): Organized logical goal-directed  Associations: Relevant and Intact  Thoughts: normal  Mental Status: Orientation: oriented to person, place, time/date and situation Mood & Affect: depressed affect and anxiety Attention Span & Concentration: Fair  Medical Decision Making (Choose Three): Established Problem, Stable/Improving (1), Review of Psycho-Social Stressors (1), Review or order clinical lab tests (1), New Problem, with no additional work-up planned (3), Review of Last Therapy Session (1) and Review of New Medication or Change in Dosage (2)  Assessment: Axis I: Bipolar disorder NOS  Axis II: Deferred  Axis III:  Patient Active Problem List   Diagnosis Date Noted  . Routine general medical  examination at a health care facility 10/15/2012  . Abnormal EKG 10/15/2012  . Hypoxia 10/15/2012  . HTN (hypertension) 09/06/2012  . Hypothyroid 09/06/2012  . GERD (gastroesophageal reflux disease) 09/06/2012  . Tobacco abuse 09/06/2012  . Depression 11/18/2011    Axis IV: Moderate  Axis V: 65   Plan: I review her psychosocial stressors, current medication and last lithium level which was done in January.  Her lithium level was 0.60.  She is tolerating her current medication.  She is sad and going through grief with the loss of her husband.  However she is not interested in counseling.  She wants to continue her current psychiatric medication.  I will discontinue Remeron since she is not taking it and does not want to try any other medication for insomnia.  She's very happy with Seroquel extended-release 400 XR at bedtime.,  Lithium 600 mg at bedtime and Celexa 40 mg daily.  This can benefit explain in detail.  Recommend to call us back if she is any question of conservatively worsening of the symptom.  Time spent 25 minutes.  More than 50% of the time spent in psychoeducation counseling and coordination of care.  Safety plan discussed that anytime having active suicidal thoughts or homicidal thoughts continue to call 911 or go to local emergency room.  I  will see her again in 2 months. Euel Castile T., MD 02/01/2013

## 2013-02-23 ENCOUNTER — Other Ambulatory Visit: Payer: Self-pay | Admitting: Family

## 2013-02-23 NOTE — Telephone Encounter (Signed)
Med filled.  

## 2013-05-04 ENCOUNTER — Ambulatory Visit (INDEPENDENT_AMBULATORY_CARE_PROVIDER_SITE_OTHER): Payer: No Typology Code available for payment source | Admitting: Psychiatry

## 2013-05-04 ENCOUNTER — Encounter (HOSPITAL_COMMUNITY): Payer: Self-pay | Admitting: Psychiatry

## 2013-05-04 VITALS — BP 122/88 | HR 80 | Ht 68.5 in | Wt 183.0 lb

## 2013-05-04 DIAGNOSIS — F332 Major depressive disorder, recurrent severe without psychotic features: Secondary | ICD-10-CM

## 2013-05-04 DIAGNOSIS — F319 Bipolar disorder, unspecified: Secondary | ICD-10-CM

## 2013-05-04 MED ORDER — LITHIUM CARBONATE ER 300 MG PO TBCR: EXTENDED_RELEASE_TABLET | ORAL | Status: DC

## 2013-05-04 MED ORDER — CITALOPRAM HYDROBROMIDE 40 MG PO TABS
40.0000 mg | ORAL_TABLET | Freq: Every day | ORAL | Status: DC
Start: 2013-05-04 — End: 2013-05-04

## 2013-05-04 MED ORDER — CITALOPRAM HYDROBROMIDE 40 MG PO TABS: 40.0000 mg | ORAL_TABLET | Freq: Every day | ORAL | Status: DC

## 2013-05-04 NOTE — Progress Notes (Signed)
[facility] Progress Note  Kathryn Buckley [id number] 62 y.o.  05/04/2013 1:33 PM  Chief Complaint:  Medication management and follow up.   History of Present Illness: Patient is 62 year old Caucasian female who came for her followup appointment.  is doing better on her current psychiatric medication.  She had good family support.  She did not go to any grief counseling.  She finally moved to a smaller place .  She is thinking to quit smoking however she has not decided any date for quitting.  She is sleeping better.  She denies any side effects of medication.  She was to continue her current psychiatric medication.  There were no tremors or shakes.  She is no longer taking Remeron .  She denies any crying spells irritability anger or depressive thoughts.  She is not drinking or using substances.  Suicidal Ideation: No Plan Formed: No Patient has means to carry out plan: No  Homicidal Ideation: No Plan Formed: No Patient has means to carry out plan: No  Review of Systems  Constitutional: Negative.   Respiratory: Negative.   Cardiovascular: Negative.   Musculoskeletal: Negative.   Skin: Negative.   Neurological: Negative.  Negative for dizziness, tingling, tremors and loss of consciousness.       History of brain tumor and surgery.  Psychiatric/Behavioral: Negative for suicidal ideas, hallucinations, memory loss and substance abuse.   Psychiatric: Agitation: No Hallucination: No Depressed Mood: No Insomnia: No Hypersomnia: No Altered Concentration: No Feels Worthless: No Grandiose Ideas: No Belief In Special Powers: No New/Increased Substance Abuse: No Compulsions: No  Neurologic: Headache: Yes Seizure: Patient has history of seizures after brain surgery.   Paresthesias: No  Medical History: Patient is seeing a Librarian, academic at [facility] clinic.  She has hypertension, hypothyroidism and hyperlipidemia.  She has history of brain tumor status post  surgery.  She had seizures however there has been no recent seizure like activity.  Family history. Patient endorse father and 2 other family member has bipolar disorder.  Psychosocial history. Patient has a son.  Her husband died this 2022-12-30 because of massive heart failure.  Outpatient Encounter Prescriptions as of 05/04/2013  Medication Sig Dispense Refill  . citalopram (CELEXA) 40 MG tablet Take 1 tablet (40 mg total) by mouth daily.  90 tablet  0  . esomeprazole (NEXIUM) 40 MG capsule Take 1 capsule (40 mg total) by mouth daily before breakfast.  90 capsule  3  . levothyroxine (SYNTHROID, LEVOTHROID) 150 MCG tablet Take 1 tablet (150 mcg total) by mouth daily.  90 tablet  3  . lisinopril (PRINIVIL,ZESTRIL) 5 MG tablet TAKE 1 TABLET BY MOUTH DAILY  90 tablet  0  . lithium carbonate (LITHOBID) 300 MG CR tablet Take 2 at HS  180 tablet  0  . QUEtiapine (SEROQUEL XR) 400 MG 24 hr tablet Take 1 tablet (400 mg total) by mouth at bedtime.  90 tablet  1  . simvastatin (ZOCOR) 40 MG tablet Take 40 mg by mouth every evening.      . [DISCONTINUED] citalopram (CELEXA) 40 MG tablet Take 1 tablet (40 mg total) by mouth daily.  90 tablet  0  . [DISCONTINUED] lithium carbonate (LITHOBID) 300 MG CR tablet Take 2 at HS  180 tablet  0   No facility-administered encounter medications on file as of 05/04/2013.    Past Psychiatric History/Hospitalization(s): Patient has been seeing in this office since December 30, 2006. She was not happy with her current treatment at [facility] .  She was seeing physician assistant for past few months and not comfortable. Patient has long history of bipolar disorder. She has tried in the past Lexapro Cymbalta, Paxil , Zoloft and Geodon.  Patient denies any history of suicidal attempt .   Anxiety: Yes Bipolar Disorder: Yes Depression: Yes Mania: Yes Psychosis: No Schizophrenia: No Personality Disorder: No Hospitalization for psychiatric illness: Yes History of Electroconvulsive  Shock Therapy: No Prior Suicide Attempts: No  Physical Exam: Constitutional:  BP 122/88  Pulse 80  Ht 5' 8.5" (1.74 m)  Wt 183 lb (83.008 kg)  BMI 27.42 kg/m2  LMP 09/23/1988  General Appearance: alert, oriented, no acute distress and well nourished  Musculoskeletal: Strength & Muscle Tone: within normal limits Gait & Station: normal Patient leans: N/A  Psychiatric: Speech (describe rate, volume, coherence, spontaneity, and abnormalities if any): Slow but clear and coherent with normal tone and volume.  Thought Process (describe rate, content, abstract reasoning, and computation): Organized logical goal-directed  Associations: Relevant and Intact  Thoughts: normal  Mental Status: Orientation: oriented to person, place, time/date and situation Mood & Affect: depressed affect and anxiety Attention Span & Concentration: Fair  Medical Decision Making (Choose Three): Established Problem, Stable/Improving (1), Review of Psycho-Social Stressors (1), Review of Last Therapy Session (1) and Review of New Medication or Change in Dosage (2)  Assessment: Axis I: Bipolar disorder NOS  Axis II: Deferred  Axis III:  Patient Active Problem List   Diagnosis Date Noted  . Routine general medical examination at a health care facility 10/15/2012  . Abnormal EKG 10/15/2012  . Hypoxia 10/15/2012  . HTN (hypertension) 09/06/2012  . Hypothyroid 09/06/2012  . GERD (gastroesophageal reflux disease) 09/06/2012  . Tobacco abuse 09/06/2012  . Depression 11/18/2011    Axis IV: Moderate  Axis V: 65   Plan:  I will continue Seroquel extended-release for milligram at bedtime, lithium 600 mg a day and Celexa 40 mg daily.  Recommend to call us back if she has any questions or concerns.  Followup in 3 months.  Shan Padgett T., MD 05/04/2013

## 2013-05-31 ENCOUNTER — Ambulatory Visit: Payer: Self-pay | Admitting: Family

## 2013-06-02 ENCOUNTER — Encounter: Payer: Self-pay | Admitting: Family

## 2013-06-02 ENCOUNTER — Other Ambulatory Visit: Payer: Self-pay | Admitting: Family

## 2013-06-02 ENCOUNTER — Ambulatory Visit (INDEPENDENT_AMBULATORY_CARE_PROVIDER_SITE_OTHER): Payer: No Typology Code available for payment source | Admitting: Family

## 2013-06-02 VITALS — BP 140/88 | HR 88 | Temp 98.0°F | Resp 18 | Ht 69.0 in | Wt 182.1 lb

## 2013-06-02 DIAGNOSIS — I10 Essential (primary) hypertension: Secondary | ICD-10-CM

## 2013-06-02 DIAGNOSIS — E785 Hyperlipidemia, unspecified: Secondary | ICD-10-CM

## 2013-06-02 DIAGNOSIS — F3289 Other specified depressive episodes: Secondary | ICD-10-CM

## 2013-06-02 DIAGNOSIS — L989 Disorder of the skin and subcutaneous tissue, unspecified: Secondary | ICD-10-CM

## 2013-06-02 DIAGNOSIS — E039 Hypothyroidism, unspecified: Secondary | ICD-10-CM

## 2013-06-02 DIAGNOSIS — F32A Depression, unspecified: Secondary | ICD-10-CM

## 2013-06-02 DIAGNOSIS — F329 Major depressive disorder, single episode, unspecified: Secondary | ICD-10-CM

## 2013-06-02 MED ORDER — SIMVASTATIN 40 MG PO TABS: 40.0000 mg | ORAL_TABLET | Freq: Every evening | ORAL | Status: DC

## 2013-06-02 MED ORDER — LEVOTHYROXINE SODIUM 150 MCG PO TABS: 150.0000 ug | ORAL_TABLET | Freq: Every day | ORAL | Status: DC

## 2013-06-02 MED ORDER — METOPROLOL TARTRATE 25 MG PO TABS: 25.0000 mg | ORAL_TABLET | Freq: Two times a day (BID) | ORAL | Status: DC

## 2013-06-02 NOTE — Patient Instructions (Addendum)
Complete lab work prior to leaving.  Stop lisinopril. Start metoprolol.  Follow up in 1 month so we can check blood pressure and see how your cough is doing.

## 2013-06-02 NOTE — Progress Notes (Signed)
Subjective:    Patient ID: Kathryn Buckley, female    DOB: 1951/03/31, 62 y.o.   MRN: [id number]  HPI  Kathryn Buckley is a 62 yr old female who presents today for follow up of multiple medical problems.  1) HTN- currently on lisinopril.  Denies LE edema.   2) Hypothyroid- she is currently maintained on synthroid. Reports feeling well controlled.    3) Cough- she is maintained on ACE inhibitor.  She reports chronic congestion.  Not worse than normal.  Clear to milky in color.    4) Depression- She continues to follow with psychiatry. Currently maintained on seroquel, citalopram and citalopram. She sees Dr. Adele Schilder.   Review of Systems See HPI  Past Medical History  Diagnosis Date  . Depression   . Thyroid disease   . HTN (hypertension)   . Hyperlipemia   . Seizures     Due to brain tumor that was removed in 2004  . COPD (chronic obstructive pulmonary disease)     History   Social History  . Marital Status: Married    Spouse Name: N/A    Number of Children: N/A  . Years of Education: N/A   Occupational History  . Not on file.   Social History Main Topics  . Smoking status: Current Every Day Smoker -- 2.50 packs/day for 40 years    Types: Cigarettes  . Smokeless tobacco: Not on file  . Alcohol Use: No  . Drug Use: No  . Sexual Activity: Not on file   Other Topics Concern  . Not on file   Social History Narrative   Lives with husband and 2 chihuahua   She has 2 children- one son died of drug overdose   1 living son- Kathryn Buckley- lives in [place].     2 step sons   She has worked in the past in Research officer, trade union)   She enjoys TV- likes to be home.             Past Surgical History  Procedure Laterality Date  . Brain surgery      Family History  Problem Relation Age of Onset  . Bipolar disorder Father   . Heart disease Father   . Cancer Father     prostate  . Heart disease Mother   . Hypertension Brother   . Diabetes Brother   . Stroke  Neg Hx   . Hyperlipidemia Neg Hx     Allergies  Allergen Reactions  . Lamictal [Lamotrigine] Rash    Current Outpatient Prescriptions on File Prior to Visit  Medication Sig Dispense Refill  . citalopram (CELEXA) 40 MG tablet Take 1 tablet (40 mg total) by mouth daily.  90 tablet  0  . esomeprazole (NEXIUM) 40 MG capsule Take 1 capsule (40 mg total) by mouth daily before breakfast.  90 capsule  3  . levothyroxine (SYNTHROID, LEVOTHROID) 150 MCG tablet Take 1 tablet (150 mcg total) by mouth daily.  90 tablet  3  . lisinopril (PRINIVIL,ZESTRIL) 5 MG tablet TAKE 1 TABLET BY MOUTH DAILY  90 tablet  0  . lithium carbonate (LITHOBID) 300 MG CR tablet Take 2 at HS  180 tablet  0  . QUEtiapine (SEROQUEL XR) 400 MG 24 hr tablet Take 1 tablet (400 mg total) by mouth at bedtime.  90 tablet  1  . simvastatin (ZOCOR) 40 MG tablet Take 40 mg by mouth every evening.       No current facility-administered medications on file  prior to visit.    BP 140/88  Pulse 88  Temp(Src) 98 F (36.7 C) (Oral)  Resp 18  Ht 5\' 9"  (1.753 m)  Wt 182 lb 1.9 oz (82.609 kg)  BMI 26.88 kg/m2  SpO2 96%  LMP 09/23/1988       Objective:   Physical Exam  Constitutional: She appears well-developed and well-nourished. No distress.  Cardiovascular: Normal rate and regular rhythm.   No murmur heard. Pulmonary/Chest: Effort normal and breath sounds normal. No respiratory distress. She has no wheezes. She has no rales. She exhibits no tenderness.  Skin:  Raised crater like lesion beneath left eye.           Assessment & Plan:

## 2013-06-03 NOTE — Telephone Encounter (Signed)
Spoke with pt and explained that we will not be able to provide 90 day supply until we see her back for follow up and determine that her current dose is stable. Pt voices understanding and scheduled 1 month follow up for 06/30/13 at 1:15pm. Denial sent to pharmacy.

## 2013-06-04 DIAGNOSIS — C44319 Basal cell carcinoma of skin of other parts of face: Secondary | ICD-10-CM | POA: Insufficient documentation

## 2013-06-04 NOTE — Assessment & Plan Note (Signed)
Clinically stable on synthroid, obtain tsh. Continue synthroid.

## 2013-06-04 NOTE — Assessment & Plan Note (Addendum)
Reinforced importance of following through with Dermatology appointment as this lesion is hight concerning for skin cancer.  She verbalizes understanding and agrees to make an appointment with her dermatologist for evaluation of skin lesion.

## 2013-06-04 NOTE — Assessment & Plan Note (Signed)
Due to cough, will have pt stop lisinopril and instead start metoprolol.

## 2013-06-04 NOTE — Assessment & Plan Note (Signed)
Stable, management per psychiatry.

## 2013-06-09 ENCOUNTER — Telehealth: Payer: Self-pay | Admitting: *Deleted

## 2013-06-09 NOTE — Telephone Encounter (Signed)
Pt brought forms to office on 06/02/13 to be completed for [place] for Nexium. Forms completed and faxed to [phone] on 06/03/13 at 5:44pm.

## 2013-06-30 ENCOUNTER — Encounter: Payer: Self-pay | Admitting: Family

## 2013-06-30 ENCOUNTER — Other Ambulatory Visit: Payer: Self-pay | Admitting: Family

## 2013-06-30 ENCOUNTER — Ambulatory Visit (INDEPENDENT_AMBULATORY_CARE_PROVIDER_SITE_OTHER): Payer: Self-pay | Admitting: Family

## 2013-06-30 VITALS — BP 128/78 | HR 91 | Temp 98.1°F | Resp 20 | Ht 69.0 in | Wt 190.0 lb

## 2013-06-30 DIAGNOSIS — I10 Essential (primary) hypertension: Secondary | ICD-10-CM

## 2013-06-30 MED ORDER — LISINOPRIL 10 MG PO TABS: 10.0000 mg | ORAL_TABLET | Freq: Every day | ORAL | Status: DC

## 2013-06-30 MED ORDER — HYDROCHLOROTHIAZIDE 25 MG PO TABS: 25.0000 mg | ORAL_TABLET | Freq: Every day | ORAL | Status: DC

## 2013-06-30 NOTE — Patient Instructions (Signed)
Check weight every day. Call me in 1 week with your weight.   Call if you have any further weight gain, especially if you gain more than 3 pounds overnight. Call if you develop worsening shortness of breath. Restart lisinopril.   Follow up in 6 weeks.

## 2013-06-30 NOTE — Assessment & Plan Note (Signed)
BP is good today. Suspect BP will rise as she remains off of meds. Wishes to restart lisinopril. Doesn't feel that her cough is that improved and does not feel like cough was bothersome enough to stop med.  I am concerned about pt's recent weight gain of 9 pounds since her last visit.  She does not appear clinically volume overloaded today. Discussed starting HCTZ instead of lisinopril to help with volume as well. She declines diuretic at this time due to cost as she does not wish to follow back up in 2 weeks for blood work as I recommended. Instead wishes to resume lisinopril with close monitoring of her weight at home- see AVS.

## 2013-06-30 NOTE — Progress Notes (Signed)
Subjective:    Patient ID: Dessie Preval, female    DOB: Feb 18, 1951, 62 y.o.   MRN: [id number]  HPI  Ms. Kaufhold is a 62 yr old female who presents today for 1 month follow up of her HTN.  Last visit  Lisinopril was discontinued due to cough. Instead she was  Placed on metoprolol but could not tolerate so she stopped approximately 1 week ago.  COPD- SOB reports that her shortness of breath is slightly worse than it had been.    Skin lesion- was told that she has basal cell carcinoma. She has been referred to [facility]      Review of Systems    see HPI  Past Medical History  Diagnosis Date  . Depression   . Thyroid disease   . HTN (hypertension)   . Hyperlipemia   . Seizures     Due to brain tumor that was removed in 2004  . COPD (chronic obstructive pulmonary disease)     History   Social History  . Marital Status: Married    Spouse Name: N/A    Number of Children: N/A  . Years of Education: N/A   Occupational History  . Not on file.   Social History Main Topics  . Smoking status: Current Every Day Smoker -- 2.50 packs/day for 40 years    Types: Cigarettes  . Smokeless tobacco: Not on file  . Alcohol Use: No  . Drug Use: No  . Sexual Activity: Not on file   Other Topics Concern  . Not on file   Social History Narrative   Lives with husband and 2 chihuahua   She has 2 children- one son died of drug overdose   1 living son- [name]- lives in [place].     2 step sons   She has worked in the past in Research officer, trade union)   She enjoys TV- likes to be home.             Past Surgical History  Procedure Laterality Date  . Brain surgery      Family History  Problem Relation Age of Onset  . Bipolar disorder Father   . Heart disease Father   . Cancer Father     prostate  . Heart disease Mother   . Hypertension Brother   . Diabetes Brother   . Stroke Neg Hx   . Hyperlipidemia Neg Hx     Allergies  Allergen Reactions  . Lamictal  [Lamotrigine] Rash    Current Outpatient Prescriptions on File Prior to Visit  Medication Sig Dispense Refill  . citalopram (CELEXA) 40 MG tablet Take 1 tablet (40 mg total) by mouth daily.  90 tablet  0  . esomeprazole (NEXIUM) 40 MG capsule Take 1 capsule (40 mg total) by mouth daily before breakfast.  90 capsule  3  . levothyroxine (SYNTHROID, LEVOTHROID) 150 MCG tablet Take 1 tablet (150 mcg total) by mouth daily.  90 tablet  0  . lithium carbonate (LITHOBID) 300 MG CR tablet Take 2 at HS  180 tablet  0  . QUEtiapine (SEROQUEL XR) 400 MG 24 hr tablet Take 1 tablet (400 mg total) by mouth at bedtime.  90 tablet  1  . simvastatin (ZOCOR) 40 MG tablet Take 1 tablet (40 mg total) by mouth every evening.  90 tablet  0   No current facility-administered medications on file prior to visit.    BP 128/78  Pulse 91  Temp(Src) 98.1 F (36.7 C) (  Oral)  Resp 20  Ht 5\' 9"  (1.753 m)  Wt 190 lb (86.183 kg)  BMI 28.05 kg/m2  SpO2 93%  LMP 09/23/1988    Objective:   Physical Exam  Constitutional: She is oriented to person, place, and time. She appears well-developed and well-nourished. No distress.  HENT:  Head: Normocephalic and atraumatic.  Cardiovascular: Normal rate and regular rhythm.   No murmur heard. Pulmonary/Chest: Effort normal and breath sounds normal. No respiratory distress. She has no wheezes. She has no rales. She exhibits no tenderness.  Musculoskeletal: She exhibits no edema.  Neurological: She is alert and oriented to person, place, and time.  Skin:  Scab noted left cheek at biopsy site.     Psychiatric: She has a normal mood and affect. Her behavior is normal. Judgment and thought content normal.          Assessment & Plan:

## 2013-07-05 ENCOUNTER — Telehealth (HOSPITAL_COMMUNITY): Payer: Self-pay | Admitting: *Deleted

## 2013-07-05 DIAGNOSIS — F332 Major depressive disorder, recurrent severe without psychotic features: Secondary | ICD-10-CM

## 2013-07-06 ENCOUNTER — Telehealth (HOSPITAL_COMMUNITY): Payer: Self-pay | Admitting: *Deleted

## 2013-07-06 NOTE — Telephone Encounter (Signed)
Pt asking that her Seroquel be faxed to [facility].

## 2013-07-06 NOTE — Telephone Encounter (Signed)
error 

## 2013-07-07 ENCOUNTER — Telehealth (HOSPITAL_COMMUNITY): Payer: Self-pay | Admitting: *Deleted

## 2013-07-08 NOTE — Telephone Encounter (Signed)
error 

## 2013-07-09 ENCOUNTER — Telehealth: Payer: Self-pay | Admitting: *Deleted

## 2013-07-09 NOTE — Telephone Encounter (Signed)
Received call from pt stating most recent application for pt assistance with Nexium had incorrect quantity.  Quantity should have been #90 but was written for #9. Advised pt I would correct and refax. Request completed.

## 2013-07-16 ENCOUNTER — Telehealth (HOSPITAL_COMMUNITY): Payer: Self-pay

## 2013-07-16 MED ORDER — QUETIAPINE FUMARATE ER 400 MG PO TB24: 400.0000 mg | ORAL_TABLET | Freq: Every day | ORAL | Status: DC

## 2013-07-16 NOTE — Telephone Encounter (Signed)
I returned patient's phone call.  She has not received Seroquel from the pharmaceutical company.  She requires 10 days of Seroquel XR 300 mg.  I will call her pharmacy for 10 days.

## 2013-07-23 ENCOUNTER — Telehealth (HOSPITAL_COMMUNITY): Payer: Self-pay

## 2013-07-23 ENCOUNTER — Other Ambulatory Visit (HOSPITAL_COMMUNITY): Payer: Self-pay | Admitting: Physician Assistant

## 2013-07-23 NOTE — Telephone Encounter (Signed)
Called patient regarding her concerns that the prescription of Seroquel XR 400 mg had no amount. Explained to patient that prescription had an amount of 10 tablets and that should be sufficient until she is able to receive her mail order prescription.

## 2013-07-25 ENCOUNTER — Other Ambulatory Visit: Payer: Self-pay | Admitting: Psychiatry

## 2013-07-25 DIAGNOSIS — F332 Major depressive disorder, recurrent severe without psychotic features: Secondary | ICD-10-CM

## 2013-07-25 MED ORDER — QUETIAPINE FUMARATE ER 400 MG PO TB24: 400.0000 mg | ORAL_TABLET | Freq: Every day | ORAL | Status: DC

## 2013-07-26 NOTE — Telephone Encounter (Signed)
Dr. Adele Schilder faxed form to [facility] &me on 07/25/13 with correct amount of medication to dispense. Per rep at [facility] & me 07/26/13, form was received and medication will be sent to pt.  Pt was notified, she will call [facility] & me to request expedited shipping as she was promised by one of their reps last week.

## 2013-08-04 ENCOUNTER — Ambulatory Visit (HOSPITAL_COMMUNITY): Payer: Self-pay | Admitting: Psychiatry

## 2013-08-23 ENCOUNTER — Other Ambulatory Visit: Payer: Self-pay | Admitting: Psychiatry

## 2013-08-24 ENCOUNTER — Other Ambulatory Visit (HOSPITAL_COMMUNITY): Payer: Self-pay | Admitting: Psychiatry

## 2013-08-26 ENCOUNTER — Other Ambulatory Visit (HOSPITAL_COMMUNITY): Payer: Self-pay | Admitting: Psychiatry

## 2013-08-26 ENCOUNTER — Telehealth (HOSPITAL_COMMUNITY): Payer: Self-pay | Admitting: *Deleted

## 2013-08-26 DIAGNOSIS — F332 Major depressive disorder, recurrent severe without psychotic features: Secondary | ICD-10-CM

## 2013-08-26 MED ORDER — CITALOPRAM HYDROBROMIDE 40 MG PO TABS: 40.0000 mg | ORAL_TABLET | Freq: Every day | ORAL | Status: DC

## 2013-08-26 MED ORDER — LITHIUM CARBONATE ER 300 MG PO TBCR: EXTENDED_RELEASE_TABLET | ORAL | Status: DC

## 2013-08-26 NOTE — Telephone Encounter (Signed)
Pt left [id number] refills of Celexa and Lithium.States she is out.[facility] told her refills refused becuase she needs appt. States cancellled app 11/12 due to surgery, but plans to make appt soon.

## 2013-08-26 NOTE — Addendum Note (Signed)
Addended by: Rolland Bimler on: 08/26/2013 04:04 PM   Modules accepted: Orders

## 2013-08-31 ENCOUNTER — Other Ambulatory Visit: Payer: Self-pay | Admitting: Family

## 2013-09-01 NOTE — Telephone Encounter (Signed)
Refill sent for zocor.  Pt is due for follow up now.  Please call pt to arrange appt per 06/30/13 office note.

## 2013-09-03 NOTE — Telephone Encounter (Signed)
Left message for patient to return my call.

## 2013-09-07 NOTE — Telephone Encounter (Signed)
Left message for patient to return my call.

## 2013-09-09 ENCOUNTER — Other Ambulatory Visit: Payer: Self-pay | Admitting: Family

## 2013-09-09 NOTE — Telephone Encounter (Signed)
Left message for patient to return my call.

## 2013-09-10 NOTE — Telephone Encounter (Signed)
Rx request to pharmacy/[facility]  

## 2013-09-22 ENCOUNTER — Ambulatory Visit (HOSPITAL_COMMUNITY): Payer: Self-pay | Admitting: Psychiatry

## 2013-10-06 ENCOUNTER — Ambulatory Visit (HOSPITAL_COMMUNITY): Payer: Self-pay | Admitting: Psychiatry

## 2013-10-14 ENCOUNTER — Encounter (INDEPENDENT_AMBULATORY_CARE_PROVIDER_SITE_OTHER): Payer: Self-pay

## 2013-10-14 ENCOUNTER — Ambulatory Visit (INDEPENDENT_AMBULATORY_CARE_PROVIDER_SITE_OTHER): Payer: No Typology Code available for payment source | Admitting: Psychiatry

## 2013-10-14 DIAGNOSIS — F332 Major depressive disorder, recurrent severe without psychotic features: Secondary | ICD-10-CM

## 2013-10-14 MED ORDER — LITHIUM CARBONATE ER 300 MG PO TBCR: EXTENDED_RELEASE_TABLET | ORAL | Status: DC

## 2013-10-14 MED ORDER — CITALOPRAM HYDROBROMIDE 40 MG PO TABS: 40.0000 mg | ORAL_TABLET | Freq: Every day | ORAL | Status: DC

## 2013-10-14 NOTE — Progress Notes (Signed)
[facility] [phone] Progress Note  Kathryn Buckley [id number] 62 y.o.  10/14/2013 2:07 PM  Chief Complaint:  Medication management and follow up.   History of Present Illness: [name] came for her followup appointment.  She had a good Christmas and holidays.  This was her first Christmas since she lost her husband last year.  She had a good support from her family.  She is compliant with medication.  She reported some issue is getting Seroquel from [facility] however it was resolved and now she has enough medication .  She is compliant with lithium and Celexa.  She denies irritability, anger or any depressive thoughts.  Her energy level is good.  She is sleeping better.  She denies any hallucination or any paranoia.  She was to continue her current psychotropic medication..  Suicidal Ideation: No Plan Formed: No Patient has means to carry out plan: No  Homicidal Ideation: No Plan Formed: No Patient has means to carry out plan: No  Review of Systems  Neurological:       History of brain tumor and surgery.   Psychiatric: Agitation: No Hallucination: No Depressed Mood: No Insomnia: No Hypersomnia: No Altered Concentration: No Feels Worthless: No Grandiose Ideas: No Belief In Special Powers: No New/Increased Substance Abuse: No Compulsions: No  Neurologic: Headache: Yes Seizure: Patient has history of seizures after brain surgery.   Paresthesias: No  Medical History:  She has hypertension, hypothyroidism and hyperlipidemia.  She has history of brain tumor status post surgery.  She had seizures however there has been no recent seizure like activity.   Outpatient Encounter Prescriptions as of 10/14/2013  Medication Sig  . citalopram (CELEXA) 40 MG tablet Take 1 tablet (40 mg total) by mouth daily.  Marland Kitchen esomeprazole (NEXIUM) 40 MG capsule Take 1 capsule (40 mg total) by mouth daily before breakfast.  . levothyroxine (SYNTHROID, LEVOTHROID) 150 MCG tablet  TAKE 1 TABLET BY MOUTH EVERY DAY  . lisinopril (PRINIVIL,ZESTRIL) 10 MG tablet Take 1 tablet (10 mg total) by mouth daily.  Marland Kitchen lithium carbonate (LITHOBID) 300 MG CR tablet Take 2 at HS  . QUEtiapine (SEROQUEL XR) 400 MG 24 hr tablet Take 1 tablet (400 mg total) by mouth at bedtime.  . simvastatin (ZOCOR) 40 MG tablet TAKE 1 TABLET BY MOUTH EVERY EVENING  . [DISCONTINUED] citalopram (CELEXA) 40 MG tablet Take 1 tablet (40 mg total) by mouth daily.  . [DISCONTINUED] lithium carbonate (LITHOBID) 300 MG CR tablet Take 2 at HS    Past Psychiatric History/Hospitalization(s): Patient has been seeing in this office since 2008. She was not happy with her current treatment at [facility] . She was seeing physician assistant for past few months and not comfortable. Patient has long history of bipolar disorder. She has tried in the past Lexapro Cymbalta, Paxil , Zoloft and Geodon.  Patient denies any history of suicidal attempt .   Anxiety: Yes Bipolar Disorder: Yes Depression: Yes Mania: Yes Psychosis: No Schizophrenia: No Personality Disorder: No Hospitalization for psychiatric illness: Yes History of Electroconvulsive Shock Therapy: No Prior Suicide Attempts: No  Physical Exam: Constitutional:  LMP 09/23/1988  General Appearance: alert, oriented, no acute distress and well nourished  Musculoskeletal: Strength & Muscle Tone: within normal limits Gait & Station: normal Patient leans: N/A  Psychiatric: Speech (describe rate, volume, coherence, spontaneity, and abnormalities if any): Slow but clear and coherent with normal tone and volume.  Thought Process (describe rate, content, abstract reasoning, and computation): Organized logical goal-directed  Associations: Relevant  and Intact  Thoughts: normal  Mental Status: Orientation: oriented to person, place, time/date and situation Mood & Affect: normal affect Attention Span & Concentration: Fair  Medical Decision Making (Choose  Three): Established Problem, Stable/Improving (1), Review of Psycho-Social Stressors (1), Review of Last Therapy Session (1) and Review of New Medication or Change in Dosage (2)  Assessment: Axis I: Bipolar disorder NOS  Axis II: Deferred  Axis III:  Patient Active Problem List   Diagnosis Date Noted  . Skin lesion 06/04/2013  . Routine general medical examination at a health care facility 10/15/2012  . Abnormal EKG 10/15/2012  . Hypoxia 10/15/2012  . HTN (hypertension) 09/06/2012  . Hypothyroid 09/06/2012  . GERD (gastroesophageal reflux disease) 09/06/2012  . Tobacco abuse 09/06/2012  . Depression 11/18/2011    Axis IV: Moderate  Axis V: 65   Plan:  I will continue Seroquel extended-release 400 mg at bedtime, lithium 600 mg a day and Celexa 40 mg daily.  We do not have any blood work especially lithium level in one year and I will order CBC, CMP, hemoglobin and C. and lithium level.  Recommend to call us back if she has any questions or concerns.  Followup in 3 months.  Dexter Signor T., MD 10/14/2013

## 2013-11-16 ENCOUNTER — Other Ambulatory Visit: Payer: Self-pay | Admitting: Family

## 2013-11-16 NOTE — Telephone Encounter (Signed)
Please inform pt that 1 month of medication was sent to pharmacy but Melissa wanted pt to return in 6 weeks (that was in Oct). Inform pt that no other refills will be sent until pt is seen

## 2013-11-17 NOTE — Telephone Encounter (Signed)
Informed patient of medication refill and she states that she will call back to reschedule.

## 2013-12-13 ENCOUNTER — Other Ambulatory Visit: Payer: Self-pay | Admitting: Family

## 2013-12-13 NOTE — Telephone Encounter (Signed)
Pt was last seen in October and advised follow up in 6 weeks. Pt is past due for appt.  Sent 30 day supply of levothyroxine to pharmacy.  Please call pt to arrange appt before further refills are due.

## 2013-12-14 ENCOUNTER — Encounter (HOSPITAL_COMMUNITY): Payer: Self-pay | Admitting: *Deleted

## 2013-12-14 NOTE — Telephone Encounter (Signed)
Informed patient of medication refill and she scheduled appointment for 12/31/13

## 2013-12-31 ENCOUNTER — Other Ambulatory Visit (HOSPITAL_COMMUNITY): Payer: Self-pay | Admitting: Psychiatry

## 2013-12-31 ENCOUNTER — Ambulatory Visit (INDEPENDENT_AMBULATORY_CARE_PROVIDER_SITE_OTHER): Payer: BC Managed Care – PPO | Admitting: Family

## 2013-12-31 ENCOUNTER — Encounter: Payer: Self-pay | Admitting: Family

## 2013-12-31 ENCOUNTER — Telehealth: Payer: Self-pay | Admitting: Family

## 2013-12-31 VITALS — BP 120/82 | HR 82 | Temp 98.1°F | Resp 18 | Ht 69.0 in | Wt 187.1 lb

## 2013-12-31 DIAGNOSIS — E039 Hypothyroidism, unspecified: Secondary | ICD-10-CM

## 2013-12-31 DIAGNOSIS — F17200 Nicotine dependence, unspecified, uncomplicated: Secondary | ICD-10-CM

## 2013-12-31 DIAGNOSIS — I10 Essential (primary) hypertension: Secondary | ICD-10-CM

## 2013-12-31 DIAGNOSIS — Z720 Tobacco use: Secondary | ICD-10-CM

## 2013-12-31 DIAGNOSIS — C44319 Basal cell carcinoma of skin of other parts of face: Secondary | ICD-10-CM

## 2013-12-31 MED ORDER — ALBUTEROL SULFATE HFA 108 (90 BASE) MCG/ACT IN AERS: 2.0000 | INHALATION_SPRAY | Freq: Four times a day (QID) | RESPIRATORY_TRACT | Status: DC | PRN

## 2013-12-31 MED ORDER — FLUTICASONE-SALMETEROL 250-50 MCG/DOSE IN AEPB: 1.0000 | INHALATION_SPRAY | Freq: Two times a day (BID) | RESPIRATORY_TRACT | Status: DC

## 2013-12-31 NOTE — Assessment & Plan Note (Signed)
Obtain follow up TSH, continue synthroid.

## 2013-12-31 NOTE — Assessment & Plan Note (Signed)
S/p wider excision, follows with Dr. Doylene Bode at [facility].

## 2013-12-31 NOTE — Assessment & Plan Note (Signed)
Improved on lisinopril.  Continue same, obtain bmet.

## 2013-12-31 NOTE — Assessment & Plan Note (Signed)
2 PPD smoker. Discussed importance of smoking cessation. Has worsening DOE last few months (no chest pain). Suspect underlying COPD. Will rx with trial of advair and prn albuterol.  Plan follow up for PFT's.

## 2013-12-31 NOTE — Progress Notes (Signed)
Subjective:    Patient ID: Kathryn Buckley, female    DOB: 26-Jul-1951, 63 y.o.   MRN: [id number]  HPI  Ms. Pirie is a 63 yr old female who presents today for follow up of multiple medical problems:  HTN-  Last visit she was started back on lisinopril.  Weight was up 9 pounds at that visit. She is down 3 pounds today. Cough remains unchanged.    BP Readings from Last 3 Encounters:  12/31/13 120/82  06/30/13 128/78  06/02/13 140/88   Wt Readings from Last 3 Encounters:  12/31/13 187 lb 1.9 oz (84.877 kg)  06/30/13 190 lb (86.183 kg)  06/02/13 182 lb 1.9 oz (82.609 kg)   Hypothyroid- she is maintained on levothyroxine.   Lab Results  Component Value Date   TSH 0.119* 09/02/2012   Basal cell carcinoma-  Reports that she had wider excision at [facility] on 3/2- Dr. Doylene Bode.  Frustrated with cosmetic result.  SOB-  Reports that sob is worse with exertion.  Smokes 2 PPD smoker.    Review of Systems See HPI  Past Medical History  Diagnosis Date  . Depression   . Thyroid disease   . HTN (hypertension)   . Hyperlipemia   . Seizures     Due to brain tumor that was removed in 2004  . COPD (chronic obstructive pulmonary disease)     History   Social History  . Marital Status: Married    Spouse Name: N/A    Number of Children: N/A  . Years of Education: N/A   Occupational History  . Not on file.   Social History Main Topics  . Smoking status: Current Every Day Smoker -- 2.50 packs/day for 40 years    Types: Cigarettes  . Smokeless tobacco: Not on file  . Alcohol Use: No  . Drug Use: No  . Sexual Activity: Not on file   Other Topics Concern  . Not on file   Social History Narrative   Lives with husband and 2 chihuahua   She has 2 children- one son died of drug overdose   1 living son- [name]- lives in [place].     2 step sons   She has worked in the past in Research officer, trade union)   She enjoys TV- likes to be home.             Past  Surgical History  Procedure Laterality Date  . Brain surgery      Family History  Problem Relation Age of Onset  . Bipolar disorder Father   . Heart disease Father   . Cancer Father     prostate  . Heart disease Mother   . Hypertension Brother   . Diabetes Brother   . Stroke Neg Hx   . Hyperlipidemia Neg Hx     Allergies  Allergen Reactions  . Lamictal [Lamotrigine] Rash    Current Outpatient Prescriptions on File Prior to Visit  Medication Sig Dispense Refill  . citalopram (CELEXA) 40 MG tablet Take 1 tablet (40 mg total) by mouth daily.  90 tablet  0  . esomeprazole (NEXIUM) 40 MG capsule Take 1 capsule (40 mg total) by mouth daily before breakfast.  90 capsule  3  . levothyroxine (SYNTHROID, LEVOTHROID) 150 MCG tablet TAKE 1 TABLET BY MOUTH EVERY DAY  30 tablet  0  . lisinopril (PRINIVIL,ZESTRIL) 10 MG tablet TAKE 1 TABLET BY MOUTH DAILY  90 tablet  0  . lithium carbonate (LITHOBID)  300 MG CR tablet Take 2 at HS  180 tablet  0  . QUEtiapine (SEROQUEL XR) 400 MG 24 hr tablet Take 1 tablet (400 mg total) by mouth at bedtime.  90 tablet  1  . simvastatin (ZOCOR) 40 MG tablet TAKE 1 TABLET BY MOUTH EVERY EVENING  90 tablet  1   No current facility-administered medications on file prior to visit.    BP 120/82  Pulse 82  Temp(Src) 98.1 F (36.7 C) (Oral)  Resp 18  Ht 5\' 9"  (1.753 m)  Wt 187 lb 1.9 oz (84.877 kg)  BMI 27.62 kg/m2  SpO2 92%  LMP 09/23/1988      see HPI  Past Medical History  Diagnosis Date  . Depression   . Thyroid disease   . HTN (hypertension)   . Hyperlipemia   . Seizures     Due to brain tumor that was removed in 2004  . COPD (chronic obstructive pulmonary disease)     History   Social History  . Marital Status: Married    Spouse Name: N/A    Number of Children: N/A  . Years of Education: N/A   Occupational History  . Not on file.   Social History Main Topics  . Smoking status: Current Every Day Smoker -- 2.50 packs/day for 40  years    Types: Cigarettes  . Smokeless tobacco: Not on file  . Alcohol Use: No  . Drug Use: No  . Sexual Activity: Not on file   Other Topics Concern  . Not on file   Social History Narrative   Lives with husband and 2 chihuahua   She has 2 children- one son died of drug overdose   1 living son- [name]- lives in [place].     2 step sons   She has worked in the past in Research officer, trade union)   She enjoys TV- likes to be home.             Past Surgical History  Procedure Laterality Date  . Brain surgery      Family History  Problem Relation Age of Onset  . Bipolar disorder Father   . Heart disease Father   . Cancer Father     prostate  . Heart disease Mother   . Hypertension Brother   . Diabetes Brother   . Stroke Neg Hx   . Hyperlipidemia Neg Hx     Allergies  Allergen Reactions  . Lamictal [Lamotrigine] Rash    Current Outpatient Prescriptions on File Prior to Visit  Medication Sig Dispense Refill  . citalopram (CELEXA) 40 MG tablet Take 1 tablet (40 mg total) by mouth daily.  90 tablet  0  . esomeprazole (NEXIUM) 40 MG capsule Take 1 capsule (40 mg total) by mouth daily before breakfast.  90 capsule  3  . levothyroxine (SYNTHROID, LEVOTHROID) 150 MCG tablet TAKE 1 TABLET BY MOUTH EVERY DAY  30 tablet  0  . lisinopril (PRINIVIL,ZESTRIL) 10 MG tablet TAKE 1 TABLET BY MOUTH DAILY  90 tablet  0  . lithium carbonate (LITHOBID) 300 MG CR tablet Take 2 at HS  180 tablet  0  . QUEtiapine (SEROQUEL XR) 400 MG 24 hr tablet Take 1 tablet (400 mg total) by mouth at bedtime.  90 tablet  1  . simvastatin (ZOCOR) 40 MG tablet TAKE 1 TABLET BY MOUTH EVERY EVENING  90 tablet  1   No current facility-administered medications on file prior to visit.  BP 120/82  Pulse 82  Temp(Src) 98.1 F (36.7 C) (Oral)  Resp 18  Ht 5\' 9"  (1.753 m)  Wt 187 lb 1.9 oz (84.877 kg)  BMI 27.62 kg/m2  SpO2 92%  LMP 09/23/1988    Objective:   Physical Exam    Constitutional: She is oriented to person, place, and time. She appears well-developed and well-nourished. No distress.  HENT:  Head: Normocephalic and atraumatic.  + scarring beneath left eye with mild associated swelling of left upper lateral cheek.    Cardiovascular: Normal rate and regular rhythm.   No murmur heard. Pulmonary/Chest: Effort normal and breath sounds normal. No respiratory distress. She has no wheezes. She has no rales. She exhibits no tenderness.  Musculoskeletal: She exhibits no edema.  Lymphadenopathy:    She has no cervical adenopathy.  Neurological: She is alert and oriented to person, place, and time.  Psychiatric: She has a normal mood and affect. Her behavior is normal. Judgment and thought content normal.          Assessment & Plan:  Of Note, we have tried to send her to cardiology in the past and she has cancelled the arranged appointment.

## 2013-12-31 NOTE — Patient Instructions (Signed)
Please schedule nurse visit for pulmonary function testing. Complete lab work prior to leaving. Follow up in 2 months.

## 2013-12-31 NOTE — Telephone Encounter (Signed)
Relevant patient education assigned to patient using Emmi. ° °

## 2013-12-31 NOTE — Progress Notes (Signed)
Pre visit review using our clinic review tool, if applicable. No additional management support is needed unless otherwise documented below in the visit note. 

## 2014-01-05 ENCOUNTER — Telehealth (HOSPITAL_COMMUNITY): Payer: Self-pay | Admitting: *Deleted

## 2014-01-05 NOTE — Telephone Encounter (Signed)
Patient left [id number] ordered thru [place] went to old address.She never received it.They require NEW RX to send her medicine.Please call. Contacted pt: Patient states she does not know what happened to the medicine as she has not lived at that address for over a year.  Patient states [place] told her to have MD send new RX and if sent to correct #, they will send out in  24 hours. asks to send new RX to [phone], attn Khyle. This is emergency fax and they told her they will send out in 24 hours.

## 2014-01-12 ENCOUNTER — Encounter (HOSPITAL_COMMUNITY): Payer: Self-pay | Admitting: Psychiatry

## 2014-01-12 ENCOUNTER — Ambulatory Visit (HOSPITAL_COMMUNITY): Payer: Self-pay | Admitting: Psychiatry

## 2014-01-12 ENCOUNTER — Ambulatory Visit (INDEPENDENT_AMBULATORY_CARE_PROVIDER_SITE_OTHER): Payer: Self-pay | Admitting: Psychiatry

## 2014-01-12 VITALS — BP 143/91 | HR 88 | Ht 68.0 in | Wt 189.4 lb

## 2014-01-12 DIAGNOSIS — F332 Major depressive disorder, recurrent severe without psychotic features: Secondary | ICD-10-CM

## 2014-01-12 DIAGNOSIS — F319 Bipolar disorder, unspecified: Secondary | ICD-10-CM

## 2014-01-12 MED ORDER — LITHIUM CARBONATE ER 300 MG PO TBCR: EXTENDED_RELEASE_TABLET | ORAL | Status: DC

## 2014-01-12 NOTE — Progress Notes (Signed)
[facility] Progress Note  Kathryn Buckley [id number] 63 y.o.  01/12/2014 12:46 PM  Chief Complaint:  Medication management and follow up.   History of Present Illness: Kathryn Buckley came for her followup appointment.  She has been compliant with her psychotropic medication.  She is very upset on [employer] because she is unable to get shipment on her correct address.  Patient told shipment came on her own address despite she has informed them .  Overall she's been doing very well.  She denies any irritability, anger, mood swing.  She sleeping better.  His energy had surgery for her basal cell carcinoma .  She is recovering from it.  She continues to have some redness on her left eye but she is recovering better.  Patient denies any tremors or shakes.  She had a good support from her sister and brother-in-law.  Patient denies any hallucination or any paranoia.  She was to continue her current psychotropic medication.  Patient forgot to have blood work but promised to do this soon and before next visit.  Suicidal Ideation: No Plan Formed: No Patient has means to carry out plan: No  Homicidal Ideation: No Plan Formed: No Patient has means to carry out plan: No  ROS Psychiatric: Agitation: No Hallucination: No Depressed Mood: No Insomnia: No Hypersomnia: No Altered Concentration: No Feels Worthless: No Grandiose Ideas: No Belief In Special Powers: No New/Increased Substance Abuse: No Compulsions: No  Neurologic: Headache: Yes Seizure: Patient has history of seizures after brain surgery.   Paresthesias: No  Medical History:  She has hypertension, hypothyroidism and hyperlipidemia.  She has history of brain tumor status post surgery.  She had seizures however there has been no recent seizure like activity.   Outpatient Encounter Prescriptions as of 01/12/2014  Medication Sig  . albuterol (PROVENTIL HFA;VENTOLIN HFA) 108 (90 BASE) MCG/ACT inhaler Inhale 2 puffs  into the lungs every 6 (six) hours as needed for wheezing or shortness of breath.  . citalopram (CELEXA) 40 MG tablet TAKE 1 TABLET BY MOUTH EVERY DAY  . esomeprazole (NEXIUM) 40 MG capsule Take 1 capsule (40 mg total) by mouth daily before breakfast.  . Fluticasone-Salmeterol (ADVAIR DISKUS) 250-50 MCG/DOSE AEPB Inhale 1 puff into the lungs 2 (two) times daily.  Marland Kitchen levothyroxine (SYNTHROID, LEVOTHROID) 150 MCG tablet TAKE 1 TABLET BY MOUTH EVERY DAY  . lisinopril (PRINIVIL,ZESTRIL) 10 MG tablet TAKE 1 TABLET BY MOUTH DAILY  . lithium carbonate (LITHOBID) 300 MG CR tablet Take 2 at HS  . QUEtiapine (SEROQUEL XR) 400 MG 24 hr tablet Take 1 tablet (400 mg total) by mouth at bedtime.  . simvastatin (ZOCOR) 40 MG tablet TAKE 1 TABLET BY MOUTH EVERY EVENING  . [DISCONTINUED] lithium carbonate (LITHOBID) 300 MG CR tablet Take 2 at HS    Past Psychiatric History/Hospitalization(s): Patient has been seeing in this office since 2008. She was not happy with her current treatment at [facility] . She was seeing physician assistant for past few months and not comfortable. Patient has long history of bipolar disorder. She has tried in the past Lexapro Cymbalta, Paxil , Zoloft and Geodon.  Patient denies any history of suicidal attempt .   Anxiety: Yes Bipolar Disorder: Yes Depression: Yes Mania: Yes Psychosis: No Schizophrenia: No Personality Disorder: No Hospitalization for psychiatric illness: Yes History of Electroconvulsive Shock Therapy: No Prior Suicide Attempts: No  Physical Exam: Constitutional:  BP 143/91  Pulse 88  Ht 5\' 8"  (1.727 m)  Wt 189 lb 6.4 oz (  85.911 kg)  BMI 28.80 kg/m2  LMP 09/23/1988  General Appearance: alert, oriented, no acute distress and well nourished  Musculoskeletal: Strength & Muscle Tone: within normal limits Gait & Station: normal Patient leans: N/A  Psychiatric: Speech (describe rate, volume, coherence, spontaneity, and abnormalities if any): Slow but  clear and coherent with normal tone and volume.  Thought Process (describe rate, content, abstract reasoning, and computation): Organized logical goal-directed  Associations: Relevant and Intact  Thoughts: normal  Mental Status: Orientation: oriented to person, place, time/date and situation Mood & Affect: normal affect Attention Span & Concentration: Fair  Established Problem, Stable/Improving (1), Review of Psycho-Social Stressors (1), Review of Last Therapy Session (1) and Review of New Medication or Change in Dosage (2)  Assessment: Axis I: Bipolar disorder NOS  Axis II: Deferred  Axis III:  Patient Active Problem List   Diagnosis Date Noted  . Basal cell carcinoma of cheek 06/04/2013  . Routine general medical examination at a health care facility 10/15/2012  . Abnormal EKG 10/15/2012  . Hypoxia 10/15/2012  . HTN (hypertension) 09/06/2012  . Hypothyroid 09/06/2012  . GERD (gastroesophageal reflux disease) 09/06/2012  . Tobacco abuse 09/06/2012  . Depression 11/18/2011    Axis IV: Moderate  Axis V: 65   Plan:  I will continue Seroquel extended-release 400 mg at bedtime, lithium 600 mg a day and Celexa 40 mg daily.  Reinforced blood work .  I will see her again in 3 months.  Recommend to call us back if she has any questions or concerns.    Kathryn Buckley T., MD 01/12/2014

## 2014-01-24 ENCOUNTER — Other Ambulatory Visit: Payer: Self-pay | Admitting: Family

## 2014-01-24 NOTE — Telephone Encounter (Signed)
Refill sent for levothyroxine. Pt last seen 12/31/13 and advised follow up in 2 months as well as nurse visit for spirometry and orders to complete TSH and bmet. Pt has not completed labs or nurse visit and does not have follow up scheduled for June.  Please call pt to arrange a 30 minute office visit for June. We can do spirometry and labs at that time but please make sure pt is scheduled early enough to be able to complete labs at the same visit.  Thanks.

## 2014-01-25 NOTE — Telephone Encounter (Signed)
Informed patient of medication refill and she states that she is having eye sx at the end of this month and will callback to schedule

## 2014-03-09 ENCOUNTER — Telehealth (HOSPITAL_COMMUNITY): Payer: Self-pay | Admitting: *Deleted

## 2014-03-09 DIAGNOSIS — F332 Major depressive disorder, recurrent severe without psychotic features: Secondary | ICD-10-CM

## 2014-03-09 MED ORDER — QUETIAPINE FUMARATE ER 400 MG PO TB24: 400.0000 mg | ORAL_TABLET | Freq: Every day | ORAL | Status: DC

## 2014-03-09 NOTE — Telephone Encounter (Signed)
Patient left [id number] prescription sent for Seroquel to [facility]  RX signed by MD and faxed to [facility]. Will be shipped to patient's home  Notified patient that RX faxed to [facility]

## 2014-03-21 ENCOUNTER — Telehealth (HOSPITAL_COMMUNITY): Payer: Self-pay | Admitting: *Deleted

## 2014-03-21 NOTE — Telephone Encounter (Signed)
Notified patient: Seroquel received from [facility] in office.

## 2014-03-24 ENCOUNTER — Telehealth (HOSPITAL_COMMUNITY): Payer: Self-pay

## 2014-03-24 NOTE — Telephone Encounter (Signed)
03/24/14 1:33PM Patietn came and pick-up mailed-order medication.Kathryn KitchenMariana Kaufman

## 2014-04-13 ENCOUNTER — Ambulatory Visit (HOSPITAL_COMMUNITY): Payer: Self-pay | Admitting: Psychiatry

## 2014-04-15 ENCOUNTER — Other Ambulatory Visit: Payer: Self-pay | Admitting: Family

## 2014-04-20 ENCOUNTER — Encounter (HOSPITAL_COMMUNITY): Payer: Self-pay | Admitting: Psychiatry

## 2014-04-20 ENCOUNTER — Encounter (INDEPENDENT_AMBULATORY_CARE_PROVIDER_SITE_OTHER): Payer: Self-pay

## 2014-04-20 ENCOUNTER — Ambulatory Visit (INDEPENDENT_AMBULATORY_CARE_PROVIDER_SITE_OTHER): Payer: Self-pay | Admitting: Psychiatry

## 2014-04-20 VITALS — BP 136/97 | HR 90 | Ht 68.0 in | Wt 188.4 lb

## 2014-04-20 DIAGNOSIS — Z79899 Other long term (current) drug therapy: Secondary | ICD-10-CM

## 2014-04-20 DIAGNOSIS — F332 Major depressive disorder, recurrent severe without psychotic features: Secondary | ICD-10-CM

## 2014-04-20 MED ORDER — LITHIUM CARBONATE ER 300 MG PO TBCR: EXTENDED_RELEASE_TABLET | ORAL | Status: DC

## 2014-04-20 MED ORDER — CITALOPRAM HYDROBROMIDE 40 MG PO TABS: 40.0000 mg | ORAL_TABLET | Freq: Every day | ORAL | Status: DC

## 2014-04-20 NOTE — Progress Notes (Signed)
[facility] Progress Note  Kathryn Buckley [id number] 63 y.o.  04/20/2014 2:50 PM  Chief Complaint:  Medication management and follow up.   History of Present Illness: Kathryn Buckley came for her followup appointment.  She is taking her medication as prescribed.  She is taking Celexa, lithium and Seroquel.  She recently received a shipment from [place] for Seroquel .  She continues to have sad moments when she missed her deceased husband but overall she is handling the loss that he well.  Her sister and her husband are very supportive.  She denies any irritability or anger.  She sleeping good.  She denies any paranoia or any hallucination.  She has not seen her primary care physician in a while and she has no blood work.  She is not drinking or using any illegal substances.  Her agitation and paranoia is under control.  She lives by herself.  Despite recommendation she continued to forget blood work but now she promised that she would do it before her next appointment.  Her appetite is okay.  Her vitals are stable.  Suicidal Ideation: No Plan Formed: No Patient has means to carry out plan: No  Homicidal Ideation: No Plan Formed: No Patient has means to carry out plan: No  ROS Psychiatric: Agitation: No Hallucination: No Depressed Mood: No Insomnia: No Hypersomnia: No Altered Concentration: No Feels Worthless: No Grandiose Ideas: No Belief In Special Powers: No New/Increased Substance Abuse: No Compulsions: No  Neurologic: Headache: Yes Seizure: Patient has history of seizures after brain surgery.   Paresthesias: No  Medical History:  She has hypertension, hypothyroidism and hyperlipidemia.  She has history of brain tumor status post surgery.  She had seizures however there has been no recent seizure like activity.  Her primary care physician is Lemar Livings.   Outpatient Encounter Prescriptions as of 04/20/2014  Medication Sig  . albuterol (PROVENTIL  HFA;VENTOLIN HFA) 108 (90 BASE) MCG/ACT inhaler Inhale 2 puffs into the lungs every 6 (six) hours as needed for wheezing or shortness of breath.  . citalopram (CELEXA) 40 MG tablet Take 1 tablet (40 mg total) by mouth daily.  Marland Kitchen esomeprazole (NEXIUM) 40 MG capsule Take 1 capsule (40 mg total) by mouth daily before breakfast.  . Fluticasone-Salmeterol (ADVAIR DISKUS) 250-50 MCG/DOSE AEPB Inhale 1 puff into the lungs 2 (two) times daily.  Marland Kitchen levothyroxine (SYNTHROID, LEVOTHROID) 150 MCG tablet TAKE 1 TABLET BY MOUTH EVERY DAY  . lisinopril (PRINIVIL,ZESTRIL) 10 MG tablet TAKE 1 TABLET BY MOUTH DAILY  . lithium carbonate (LITHOBID) 300 MG CR tablet Take 2 at HS  . QUEtiapine (SEROQUEL XR) 400 MG 24 hr tablet Take 1 tablet (400 mg total) by mouth at bedtime.  . simvastatin (ZOCOR) 40 MG tablet TAKE 1 TABLET BY MOUTH EVERY EVENING  . [DISCONTINUED] citalopram (CELEXA) 40 MG tablet TAKE 1 TABLET BY MOUTH EVERY DAY  . [DISCONTINUED] lithium carbonate (LITHOBID) 300 MG CR tablet Take 2 at HS    Past Psychiatric History/Hospitalization(s): Patient has been seeing in this office since 2008. She was not happy with her current treatment at [facility] . Patient has long history of bipolar disorder. She has tried in the past Lexapro Cymbalta, Paxil , Zoloft and Geodon.  Patient denies any history of suicidal attempt .  Anxiety: Yes Bipolar Disorder: Yes Depression: Yes Mania: Yes Psychosis: No Schizophrenia: No Personality Disorder: No Hospitalization for psychiatric illness: Yes History of Electroconvulsive Shock Therapy: No Prior Suicide Attempts: No  Physical Exam: Constitutional:  BP 136/97  Pulse 90  Ht 5\' 8"  (1.727 m)  Wt 188 lb 6.4 oz (85.458 kg)  BMI 28.65 kg/m2  LMP 09/23/1988  General Appearance: alert, oriented, no acute distress and well nourished  Musculoskeletal: Strength & Muscle Tone: within normal limits Gait & Station: normal Patient leans: N/A  Psychiatric: Speech  (describe rate, volume, coherence, spontaneity, and abnormalities if any): Slow but clear and coherent with normal tone and volume.  Thought Process (describe rate, content, abstract reasoning, and computation): Organized logical goal-directed  Associations: Relevant and Intact  Thoughts: normal  Mental Status: Orientation: oriented to person, place, time/date and situation Mood & Affect: normal affect Attention Span & Concentration: Fair  Established Problem, Stable/Improving (1), Review of Psycho-Social Stressors (1), Review or order clinical lab tests (1), Review of Last Therapy Session (1) and Review of Medication Regimen & Side Effects (2)  Assessment: Axis I: Bipolar disorder NOS  Axis II: Deferred  Axis III:  Patient Active Problem List   Diagnosis Date Noted  . Basal cell carcinoma of cheek 06/04/2013  . Routine general medical examination at a health care facility 10/15/2012  . Abnormal EKG 10/15/2012  . Hypoxia 10/15/2012  . HTN (hypertension) 09/06/2012  . Hypothyroid 09/06/2012  . GERD (gastroesophageal reflux disease) 09/06/2012  . Tobacco abuse 09/06/2012  . Depression 11/18/2011    Axis IV: Moderate  Axis V: 65   Plan:  Patient is fairly stable on her current psychotropic medication.  I reinforce blood work.  He will do CBC, CMP, hemoglobin A1c and lithium level.  A new prescription of lithium 600 mg a day and Celexa 40 mg a day was given.  She already have enough Seroquel .  Recommended to call us back if she has any question or any concern.  Followup in 3 months.  Kathryn Bradt T., MD 04/20/2014

## 2014-05-11 ENCOUNTER — Other Ambulatory Visit: Payer: Self-pay | Admitting: Family

## 2014-06-29 ENCOUNTER — Other Ambulatory Visit (HOSPITAL_COMMUNITY): Payer: Self-pay | Admitting: Psychiatry

## 2014-06-29 ENCOUNTER — Ambulatory Visit (INDEPENDENT_AMBULATORY_CARE_PROVIDER_SITE_OTHER): Payer: BC Managed Care – PPO | Admitting: Family

## 2014-06-29 ENCOUNTER — Encounter: Payer: Self-pay | Admitting: Family

## 2014-06-29 ENCOUNTER — Telehealth: Payer: Self-pay | Admitting: *Deleted

## 2014-06-29 VITALS — BP 140/100 | HR 87 | Temp 98.3°F | Resp 18 | Ht 69.0 in | Wt 190.8 lb

## 2014-06-29 DIAGNOSIS — I10 Essential (primary) hypertension: Secondary | ICD-10-CM

## 2014-06-29 DIAGNOSIS — E039 Hypothyroidism, unspecified: Secondary | ICD-10-CM

## 2014-06-29 DIAGNOSIS — R0902 Hypoxemia: Secondary | ICD-10-CM

## 2014-06-29 MED ORDER — BUDESONIDE-FORMOTEROL FUMARATE 160-4.5 MCG/ACT IN AERO: 2.0000 | INHALATION_SPRAY | Freq: Two times a day (BID) | RESPIRATORY_TRACT | Status: DC

## 2014-06-29 MED ORDER — ESOMEPRAZOLE MAGNESIUM 40 MG PO CPDR: 40.0000 mg | DELAYED_RELEASE_CAPSULE | Freq: Every day | ORAL | Status: DC

## 2014-06-29 MED ORDER — LISINOPRIL 10 MG PO TABS: ORAL_TABLET | ORAL | Status: DC

## 2014-06-29 MED ORDER — SIMVASTATIN 40 MG PO TABS: ORAL_TABLET | ORAL | Status: DC

## 2014-06-29 MED ORDER — LEVOTHYROXINE SODIUM 150 MCG PO TABS: ORAL_TABLET | ORAL | Status: DC

## 2014-06-29 NOTE — Telephone Encounter (Signed)
Nexium rx printed and faxed to [phone].

## 2014-06-29 NOTE — Patient Instructions (Signed)
Call re: symbicort coupon- [employer] in the [place] at: [phone]   Complete lab work prior to leaving. Let me know if you change your mind re: home oxygen and pulmonology referral. Follow up in 6 weeks.

## 2014-06-29 NOTE — Progress Notes (Signed)
Subjective:    Patient ID: Kathryn Buckley, female    DOB: [birth year], 63 y.o.   MRN: [id number]  HPI Kathryn Buckley is a 63 year old female who presents today for follow up.    1. Hypertension - Been out of blood pressure meds for one month.  Does not take BP at home.  Endorses shortness of breath and sometimes feels like her heart is racing.  Denies swelling in legs or ankles.    2. Hypothyroidism - Has not taken synthroid for one month.  Last visit a TSh was ordered but was not able to obtain labs because she felt sick and could not go to the labHas not gotten TSH. Reports feeling more tired lately.     3. Cough - Currently smokes three packs a day.  Reports being most out of breath in the morning.  Endorses productive cough, with clear to white sputum.  Not able to walk to mailbox due to shortness of breath. Has tried to quit smoking in the past and does not wish to quit.  She says that she is addicted and reports feeling depressed.  Did not complete pulmonary function testing as advised last appointment. She reports that she has not filled her Advair because of the high cost. She did have an  Albuterol inhaler but felt like it did not help.     Review of Systems  Constitutional: Negative.   HENT: Negative.   Respiratory: Positive for cough and shortness of breath.   Cardiovascular: Negative for chest pain, palpitations and leg swelling.  Musculoskeletal: Negative.   Skin: Negative.    Past Medical History  Diagnosis Date  . Depression   . Thyroid disease   . HTN (hypertension)   . Hyperlipemia   . Seizures     Due to brain tumor that was removed in 2004  . COPD (chronic obstructive pulmonary disease)     History   Social History  . Marital Status: Married    Spouse Name: N/A    Number of Children: N/A  . Years of Education: N/A   Occupational History  . Not on file.   Social History Main Topics  . Smoking status: Current Every Day Smoker -- 2.50 packs/day for 40  years    Types: Cigarettes  . Smokeless tobacco: Not on file  . Alcohol Use: No  . Drug Use: No  . Sexual Activity: Not on file   Other Topics Concern  . Not on file   Social History Narrative   Lives with husband and 2 chihuahua   She has 2 children- one son died of drug overdose   1 living son- Kathryn Buckley- lives in [place].     2 step sons   She has worked in the past in Research officer, [employer])   She enjoys TV- likes to be home.             Past Surgical History  Procedure Laterality Date  . Brain surgery      Family History  Problem Relation Age of Onset  . Bipolar disorder Father   . Heart disease Father   . Cancer Father     prostate  . Heart disease Mother   . Hypertension Brother   . Diabetes Brother   . Stroke Neg Hx   . Hyperlipidemia Neg Hx     Allergies  Allergen Reactions  . Lamictal [Lamotrigine] Rash    Current Outpatient Prescriptions on File Prior to Visit  Medication  Sig Dispense Refill  . albuterol (PROVENTIL HFA;VENTOLIN HFA) 108 (90 BASE) MCG/ACT inhaler Inhale 2 puffs into the lungs every 6 (six) hours as needed for wheezing or shortness of breath.  1 Inhaler  2  . citalopram (CELEXA) 40 MG tablet Take 1 tablet (40 mg total) by mouth daily.  90 tablet  0  . lithium carbonate (LITHOBID) 300 MG CR tablet Take 2 at HS  180 tablet  0  . QUEtiapine (SEROQUEL XR) 400 MG 24 hr tablet Take 1 tablet (400 mg total) by mouth at bedtime.  90 tablet  0   No current facility-administered medications on file prior to visit.    BP 140/100  Pulse 87  Temp(Src) 98.3 F (36.8 C) (Oral)  Resp 18  Ht 5\' 9"  (1.753 m)  Wt 190 lb 12.8 oz (86.546 kg)  BMI 28.16 kg/m2  SpO2 89%  LMP 09/23/1988       Objective:   Physical Exam  Constitutional: She is oriented to person, place, and time. She appears well-developed.  HENT:  Head: Normocephalic and atraumatic.  Right Ear: External ear normal.  Left Ear: External ear normal.  Nose: Nose normal.    Mouth/Throat: Oropharynx is clear and moist. No oropharyngeal exudate.  Neck: Normal range of motion. Neck supple. No JVD present. No thyromegaly present.  Cardiovascular: Normal rate, regular rhythm, normal heart sounds and intact distal pulses.   No murmur heard. Pulmonary/Chest: Effort normal. She has decreased breath sounds in the right upper field, the right middle field, the right lower field, the left upper field, the left middle field and the left lower field. She has no wheezes. She has no rhonchi. She has no rales.  Neurological: She is alert and oriented to person, place, and time.  Skin: Skin is warm and dry. No rash noted. She is not diaphoretic. Nails show clubbing.          Assessment & Plan:  I have personally seen and examined patient and agree with Kathryn Booze NP student's assessment and plan.

## 2014-06-29 NOTE — Progress Notes (Signed)
Pre visit review using our clinic review tool, if applicable. No additional management support is needed unless otherwise documented below in the visit note. 

## 2014-06-30 ENCOUNTER — Telehealth: Payer: Self-pay | Admitting: Family

## 2014-06-30 LAB — LITHIUM LEVEL: LITHIUM LVL: 0.5 meq/L — AB (ref 0.80–1.40)

## 2014-06-30 LAB — CBC WITH DIFFERENTIAL/PLATELET
BASOS PCT: 0 % (ref 0–1)
Basophils Absolute: 0 10*3/uL (ref 0.0–0.1)
Eosinophils Absolute: 0 10*3/uL (ref 0.0–0.7)
Eosinophils Relative: 0 % (ref 0–5)
HEMATOCRIT: 50.6 % — AB (ref 36.0–46.0)
Hemoglobin: 16.7 g/dL — ABNORMAL HIGH (ref 12.0–15.0)
LYMPHS PCT: 31 % (ref 12–46)
Lymphs Abs: 1.4 10*3/uL (ref 0.7–4.0)
MCH: 32.6 pg (ref 26.0–34.0)
MCHC: 33 g/dL (ref 30.0–36.0)
MCV: 98.8 fL (ref 78.0–100.0)
Monocytes Absolute: 0.4 10*3/uL (ref 0.1–1.0)
Monocytes Relative: 8 % (ref 3–12)
NEUTROS PCT: 61 % (ref 43–77)
Neutro Abs: 2.8 10*3/uL (ref 1.7–7.7)
PLATELETS: 138 10*3/uL — AB (ref 150–400)
RBC: 5.12 MIL/uL — ABNORMAL HIGH (ref 3.87–5.11)
RDW: 17.1 % — AB (ref 11.5–15.5)
WBC: 4.6 10*3/uL (ref 4.0–10.5)

## 2014-06-30 LAB — HEMOGLOBIN A1C
Hgb A1c MFr Bld: 6.1 % — ABNORMAL HIGH (ref <5.7)
Mean Plasma Glucose: 128 mg/dL — ABNORMAL HIGH (ref <117)

## 2014-06-30 LAB — COMPREHENSIVE METABOLIC PANEL
ALBUMIN: 4.4 g/dL (ref 3.5–5.2)
ALT: 25 U/L (ref 0–35)
AST: 21 U/L (ref 0–37)
Alkaline Phosphatase: 58 U/L (ref 39–117)
BUN: 9 mg/dL (ref 6–23)
CALCIUM: 9.1 mg/dL (ref 8.4–10.5)
CHLORIDE: 105 meq/L (ref 96–112)
CO2: 28 mEq/L (ref 19–32)
Creat: 0.96 mg/dL (ref 0.50–1.10)
Glucose, Bld: 119 mg/dL — ABNORMAL HIGH (ref 70–99)
POTASSIUM: 4.3 meq/L (ref 3.5–5.3)
SODIUM: 141 meq/L (ref 135–145)
Total Bilirubin: 0.4 mg/dL (ref 0.2–1.2)
Total Protein: 7.1 g/dL (ref 6.0–8.3)

## 2014-06-30 LAB — TSH: TSH: 17.76 u[IU]/mL — ABNORMAL HIGH (ref 0.35–4.50)

## 2014-06-30 NOTE — Telephone Encounter (Signed)
emmi mailed  °

## 2014-07-01 ENCOUNTER — Other Ambulatory Visit: Payer: Self-pay | Admitting: Family

## 2014-07-01 DIAGNOSIS — J449 Chronic obstructive pulmonary disease, unspecified: Secondary | ICD-10-CM

## 2014-07-01 NOTE — Telephone Encounter (Signed)
Caller name: Kaleigha Relation to pt: self Call back number: [phone] Pharmacy:  Reason for call:   Patient is requesting to have oxygen in her home.

## 2014-07-03 DIAGNOSIS — J449 Chronic obstructive pulmonary disease, unspecified: Secondary | ICD-10-CM | POA: Insufficient documentation

## 2014-07-03 NOTE — Assessment & Plan Note (Signed)
Secondary to ongoing tobacco abuse. Refer to pulmonary.

## 2014-07-03 NOTE — Assessment & Plan Note (Addendum)
Suspect secondary to advanced COPD.  Oxygen with ambulation today dropped to 83%. Advised pt on need for supplemental oxygen.  Initially she declined but later on called back and was agreeable. Will arrange home oxygen and pulmonary referral.

## 2014-07-03 NOTE — Telephone Encounter (Signed)
Will arrange home oxygen. I would also like for patient to meet with pulmonology.

## 2014-07-03 NOTE — Assessment & Plan Note (Signed)
BP Readings from Last 3 Encounters:  06/29/14 140/100  04/20/14 136/97  01/12/14 143/91   Increase lisinopril to 20mg .
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
## 2014-07-03 NOTE — Assessment & Plan Note (Signed)
Lab Results  Component Value Date   TSH 17.76* 06/29/2014   Uncontrolled.  Will plan to adjust synthroid, see phone note.

## 2014-07-04 NOTE — Telephone Encounter (Signed)
Caller name: Melissa Relation to pt: [facility] Call back number: [phone]   Reason for call:  Has questions regaring oxygen order. Call back

## 2014-07-05 ENCOUNTER — Telehealth: Payer: Self-pay | Admitting: Family

## 2014-07-05 MED ORDER — LISINOPRIL 20 MG PO TABS: 20.0000 mg | ORAL_TABLET | Freq: Every day | ORAL | Status: DC

## 2014-07-05 NOTE — Telephone Encounter (Signed)
Order placed

## 2014-07-05 NOTE — Telephone Encounter (Signed)
Caller name:Huston, Chyane Relation to pt: self  Call back number:[phone] Pharmacy:  Reason for call: pt would like to discuss oxygen

## 2014-07-05 NOTE — Telephone Encounter (Signed)
Left message for Dahlia Client with [facility] to return my call.  Left message for pt to return my call.

## 2014-07-05 NOTE — Telephone Encounter (Signed)
Spoke with Melissa from [facility], she requests that we re-enter order and include the frequency of O2 (nocturnal, continuous, etc. . ..)? She requests that we let he know when order has been re-entered and she will pull order from [facility].  Please advise.

## 2014-07-06 NOTE — Telephone Encounter (Signed)
Attempted to reach pt, # not in service.

## 2014-07-07 NOTE — Telephone Encounter (Signed)
Pt called in today to see if she was called back, pt states that had her phone not charged. Please call pt.

## 2014-07-08 ENCOUNTER — Telehealth: Payer: Self-pay | Admitting: Family

## 2014-07-08 NOTE — Telephone Encounter (Signed)
See previous phone note.  

## 2014-07-08 NOTE — Telephone Encounter (Signed)
Spoke with pt and advised her that we have initiated home health order for continuous oxygen. Also notified her of below results. Pt had been off of thyroid medication 1 month prior to medication and will restart dose. Pt will repeat TSH on 08/22/14 and scheduled 2 week f/u for 07/27/14 at 1:15pm for BP recheck.

## 2014-07-08 NOTE — Telephone Encounter (Signed)
Caller name: Corene Cornea Relation to [id number] Call back number: [phone]  Reason for call:  FYI--Unable to contact pt to set up oxygen that was ordered this week.

## 2014-07-11 NOTE — Telephone Encounter (Signed)
Spoke with [place] on 07/08/14 and asked them to continue attempts to contact pt as I had just talked with her on Friday morning and pt had stated that her phone had not been charged previously. They will make 2 more attempts.

## 2014-07-21 ENCOUNTER — Ambulatory Visit (HOSPITAL_COMMUNITY): Payer: Self-pay | Admitting: Psychiatry

## 2014-07-27 ENCOUNTER — Ambulatory Visit: Payer: BC Managed Care – PPO | Admitting: Family

## 2014-07-27 ENCOUNTER — Encounter: Payer: Self-pay | Admitting: Family

## 2014-07-27 ENCOUNTER — Telehealth: Payer: Self-pay | Admitting: *Deleted

## 2014-07-27 DIAGNOSIS — Z0289 Encounter for other administrative examinations: Secondary | ICD-10-CM

## 2014-07-27 NOTE — Telephone Encounter (Signed)
Please send no show letter

## 2014-07-27 NOTE — Telephone Encounter (Signed)
Pt did not show for appointment 09/26/2013 at 1:15pm for 2 week f/u BP

## 2014-07-27 NOTE — Telephone Encounter (Signed)
Letter mailed 07/27/2014

## 2014-08-07 ENCOUNTER — Other Ambulatory Visit: Payer: Self-pay | Admitting: Family

## 2014-08-10 ENCOUNTER — Ambulatory Visit (INDEPENDENT_AMBULATORY_CARE_PROVIDER_SITE_OTHER): Payer: Self-pay | Admitting: Psychiatry

## 2014-08-10 ENCOUNTER — Encounter (HOSPITAL_COMMUNITY): Payer: Self-pay | Admitting: Psychiatry

## 2014-08-10 VITALS — BP 117/57 | HR 92 | Ht 69.5 in | Wt 183.6 lb

## 2014-08-10 DIAGNOSIS — F319 Bipolar disorder, unspecified: Secondary | ICD-10-CM

## 2014-08-10 DIAGNOSIS — F3131 Bipolar disorder, current episode depressed, mild: Secondary | ICD-10-CM

## 2014-08-10 MED ORDER — CITALOPRAM HYDROBROMIDE 40 MG PO TABS: 40.0000 mg | ORAL_TABLET | Freq: Every day | ORAL | Status: DC

## 2014-08-10 MED ORDER — LITHIUM CARBONATE ER 300 MG PO TBCR: EXTENDED_RELEASE_TABLET | ORAL | Status: DC

## 2014-08-10 NOTE — Progress Notes (Signed)
Kathryn Buckley [id number] 63 y.o.  08/10/2014 2:17 PM  Chief Complaint:  Medication management and follow up.   History of Present Illness: Kathryn Buckley came for her followup appointment.  She is compliant with her medication and denies any side effects.  Recently she's seen her primary care physician and now she is using oxygen.  She has noticed much improvement in her sleep.  She does not wake up in the middle of the night.  She denies any major panic attack.  Her energy level is good.  She is taking Seroquel from pharmaceutical company and she continues to use lithium and Celexa as prescribed.  She has no tremors or shakes.  She denies any feeling of hopelessness or worthlessness.  She is somewhat concerned because of the holidays and she is going to miss her deceased husband however her sister and brother are very supportive.  She mentioned that 3-4 times her sister and her husband visit her.  Patient denies any paranoia or any hallucination.  She denies drinking or using any illegal substances.  Her appetite is okay.  Her vitals are stable.  She had blood work last month and her hemoglobin A1c is 6.1 and her lithium level is 0.50.  Her basic chemistry is normal. Patient lives by herself.    Suicidal Ideation: No Plan Formed: No Patient has means to carry out plan: No  Homicidal Ideation: No Plan Formed: No Patient has means to carry out plan: No  Review of Systems  Constitutional: Negative.   Skin: Negative.   Neurological: Negative for headaches.   Psychiatric: Agitation: No Hallucination: No Depressed Mood: No Insomnia: No Hypersomnia: No Altered Concentration: No Feels Worthless: No Grandiose Ideas: No Belief In Special Powers: No New/Increased Substance Abuse: No Compulsions: No  Neurologic: Headache: Yes Seizure: Patient has history of seizures after brain surgery.   Paresthesias: No  Medical History:  She has  hypertension, hypothyroidism and hyperlipidemia.  She has history of brain tumor status post surgery.  She had seizures however there has been no recent seizure like activity.  Her primary care physician is Lemar Livings.   Outpatient Encounter Prescriptions as of 08/10/2014  Medication Sig  . albuterol (PROVENTIL HFA;VENTOLIN HFA) 108 (90 BASE) MCG/ACT inhaler Inhale 2 puffs into the lungs every 6 (six) hours as needed for wheezing or shortness of breath.  . budesonide-formoterol (SYMBICORT) 160-4.5 MCG/ACT inhaler Inhale 2 puffs into the lungs 2 (two) times daily.  . citalopram (CELEXA) 40 MG tablet Take 1 tablet (40 mg total) by mouth daily.  Marland Kitchen esomeprazole (NEXIUM) 40 MG capsule Take 1 capsule (40 mg total) by mouth daily before breakfast.  . levothyroxine (SYNTHROID, LEVOTHROID) 150 MCG tablet TAKE 1 TABLET BY MOUTH EVERY DAY  . lisinopril (PRINIVIL,ZESTRIL) 20 MG tablet Take 1 tablet (20 mg total) by mouth daily.  Marland Kitchen lithium carbonate (LITHOBID) 300 MG CR tablet Take 2 at HS  . QUEtiapine (SEROQUEL XR) 400 MG 24 hr tablet Take 1 tablet (400 mg total) by mouth at bedtime.  . simvastatin (ZOCOR) 40 MG tablet TAKE 1 TABLET EVERY EVENEING  . [DISCONTINUED] citalopram (CELEXA) 40 MG tablet Take 1 tablet (40 mg total) by mouth daily.  . [DISCONTINUED] lithium carbonate (LITHOBID) 300 MG CR tablet Take 2 at HS    Past Psychiatric History/Hospitalization(s): Patient has been seeing in this office since 2008. She was not happy with her current treatment at [facility] . Patient has long history of bipolar disorder.  She has tried in the past Lexapro Cymbalta, Paxil , Zoloft and Geodon.  Patient denies any history of suicidal attempt .  Anxiety: Yes Bipolar Disorder: Yes Depression: Yes Mania: Yes Psychosis: No Schizophrenia: No Personality Disorder: No Hospitalization for psychiatric illness: Yes History of Electroconvulsive Shock Therapy: No Prior Suicide Attempts: No  Physical  Exam: Recent Results (from the past 2160 hour(s))  CBC with Differential     Status: Abnormal   Collection Time: 06/29/14 12:00 AM  Result Value Ref Range   WBC 4.6 4.0 - 10.5 K/uL   RBC 5.12 (H) 3.87 - 5.11 MIL/uL   Hemoglobin 16.7 (H) 12.0 - 15.0 g/dL   HCT 50.6 (H) 36.0 - 46.0 %   MCV 98.8 78.0 - 100.0 fL   MCH 32.6 26.0 - 34.0 pg   MCHC 33.0 30.0 - 36.0 g/dL   RDW 17.1 (H) 11.5 - 15.5 %   Platelets 138 (L) 150 - 400 K/uL   Neutrophils Relative % 61 43 - 77 %   Neutro Abs 2.8 1.7 - 7.7 K/uL   Lymphocytes Relative 31 12 - 46 %   Lymphs Abs 1.4 0.7 - 4.0 K/uL   Monocytes Relative 8 3 - 12 %   Monocytes Absolute 0.4 0.1 - 1.0 K/uL   Eosinophils Relative 0 0 - 5 %   Eosinophils Absolute 0.0 0.0 - 0.7 K/uL   Basophils Relative 0 0 - 1 %   Basophils Absolute 0.0 0.0 - 0.1 K/uL   Smear Review Criteria for review not met   Comprehensive metabolic panel     Status: Abnormal   Collection Time: 06/29/14 12:00 AM  Result Value Ref Range   Sodium 141 135 - 145 mEq/L   Potassium 4.3 3.5 - 5.3 mEq/L   Chloride 105 96 - 112 mEq/L   CO2 28 19 - 32 mEq/L   Glucose, Bld 119 (H) 70 - 99 mg/dL   BUN 9 6 - 23 mg/dL   Creat 0.96 0.50 - 1.10 mg/dL   Total Bilirubin 0.4 0.2 - 1.2 mg/dL   Alkaline Phosphatase 58 39 - 117 U/L   AST 21 0 - 37 U/L   ALT 25 0 - 35 U/L   Total Protein 7.1 6.0 - 8.3 g/dL   Albumin 4.4 3.5 - 5.2 g/dL   Calcium 9.1 8.4 - 10.5 mg/dL  Hemoglobin A1c     Status: Abnormal   Collection Time: 06/29/14 12:00 AM  Result Value Ref Range   Hgb A1c MFr Bld 6.1 (H) <5.7 %    Comment:                                                                        According to the ADA Clinical Practice Recommendations for 2011, when HbA1c is used as a screening test:     >=6.5%   Diagnostic of Diabetes Mellitus            (if abnormal result is confirmed)   5.7-6.4%   Increased risk of developing Diabetes Mellitus   References:Diagnosis and Classification of Diabetes  Mellitus,Diabetes ZHYQ,6578,46(NGEXB 1):S62-S69 and Standards of Medical Care in         Diabetes - 2011,Diabetes Care,2011,34 (Suppl 1):S11-S61.  Mean Plasma Glucose 128 (H) <117 mg/dL  Lithium level     Status: Abnormal   Collection Time: 06/29/14 12:00 AM  Result Value Ref Range   Lithium Lvl 0.50 (L) 0.80 - 1.40 mEq/L  TSH     Status: Abnormal   Collection Time: 06/29/14  3:03 PM  Result Value Ref Range   TSH 17.76 (H) 0.35 - 4.50 uIU/mL   Constitutional:  BP 117/57 mmHg  Pulse 92  Ht 5' 9.5" (1.765 m)  Wt 183 lb 9.6 oz (83.28 kg)  BMI 26.73 kg/m2  LMP 09/23/1988  General Appearance: alert, oriented, no acute distress and well nourished  Musculoskeletal: Strength & Muscle Tone: within normal limits Gait & Station: normal Patient leans: N/A  Psychiatric: Speech (describe rate, volume, coherence, spontaneity, and abnormalities if any): Slow but clear and coherent with normal tone and volume.  Thought Process (describe rate, content, abstract reasoning, and computation): Organized logical goal-directed  Associations: Relevant and Intact  Thoughts: normal  Mental Status: Orientation: oriented to person, place, time/date and situation Mood & Affect: normal affect Attention Span & Concentration: Fair  Established Problem, Stable/Improving (1), Review of Psycho-Social Stressors (1), Review or order clinical lab tests (1), Review of Last Therapy Session (1) and Review of Medication Regimen & Side Effects (2)  Assessment: Axis I: Bipolar disorder NOS  Axis II: Deferred  Axis III:  Patient Active Problem List   Diagnosis Date Noted  . COPD (chronic obstructive pulmonary disease) 07/03/2014  . Basal cell carcinoma of cheek 06/04/2013  . Routine general medical examination at a health care facility 10/15/2012  . Abnormal EKG 10/15/2012  . Hypoxia 10/15/2012  . HTN (hypertension) 09/06/2012  . Hypothyroid 09/06/2012  . GERD (gastroesophageal reflux disease)  09/06/2012  . Tobacco abuse 09/06/2012  . Depression 11/18/2011    Axis IV: Moderate  Axis V: 65   Plan:  I reviewed blood work results, current medication and her psychosocial issues.  She is fairly stable on her current medication.  Her lithium level is sub therapeutic but she is doing better.  I will continue Celexa 40 mg daily, lithium 600 mg a day Seroquel XR 400 mg at bedtime.  Discussed medication side effects including hyperlipidemia and metabolic syndrome.  Recommended to call us back if she has any question or any concern.  I will see her again in 3 months. Time spent 25 minutes.  More than 50% of the time spent in psychoeducation, counseling and coordination of care.  Discuss safety plan that anytime having active suicidal thoughts or homicidal thoughts then patient need to call 911 or go to the local emergency room.  Tirsa Gail T., MD 08/10/2014

## 2014-08-11 ENCOUNTER — Other Ambulatory Visit (HOSPITAL_COMMUNITY): Payer: Self-pay | Admitting: *Deleted

## 2014-08-11 DIAGNOSIS — F3131 Bipolar disorder, current episode depressed, mild: Secondary | ICD-10-CM

## 2014-08-11 MED ORDER — QUETIAPINE FUMARATE ER 400 MG PO TB24: 400.0000 mg | ORAL_TABLET | Freq: Every day | ORAL | Status: DC

## 2014-08-11 NOTE — Telephone Encounter (Signed)
Received faxed refill request received for Seroquel  XR 400 mg, 90 day supply from [employer] patient assistance program.  Refill authorized by Dr. Adele Schilder  Will be mailed to patient's home at pt request.Information included in fax to [facility] [phone]

## 2014-08-22 ENCOUNTER — Other Ambulatory Visit: Payer: BC Managed Care – PPO

## 2014-08-25 ENCOUNTER — Ambulatory Visit (INDEPENDENT_AMBULATORY_CARE_PROVIDER_SITE_OTHER): Payer: BC Managed Care – PPO | Admitting: Critical Care Medicine

## 2014-08-25 ENCOUNTER — Encounter: Payer: Self-pay | Admitting: Critical Care Medicine

## 2014-08-25 VITALS — BP 149/96 | HR 86 | Temp 98.2°F | Ht 69.0 in | Wt 185.0 lb

## 2014-08-25 DIAGNOSIS — J418 Mixed simple and mucopurulent chronic bronchitis: Secondary | ICD-10-CM

## 2014-08-25 DIAGNOSIS — J449 Chronic obstructive pulmonary disease, unspecified: Secondary | ICD-10-CM

## 2014-08-25 DIAGNOSIS — J9611 Chronic respiratory failure with hypoxia: Secondary | ICD-10-CM | POA: Insufficient documentation

## 2014-08-25 DIAGNOSIS — F3162 Bipolar disorder, current episode mixed, moderate: Secondary | ICD-10-CM | POA: Insufficient documentation

## 2014-08-25 MED ORDER — FLUTICASONE FUROATE-VILANTEROL 100-25 MCG/INH IN AEPB: 1.0000 | INHALATION_SPRAY | Freq: Every day | RESPIRATORY_TRACT | Status: DC

## 2014-08-25 MED ORDER — LOSARTAN POTASSIUM 100 MG PO TABS: 50.0000 mg | ORAL_TABLET | Freq: Every day | ORAL | Status: DC

## 2014-08-25 NOTE — Patient Instructions (Signed)
We will obtain portable oxygen 2Liters from [facility] one puff daily Stop lisinopril  Start losartan 100mg  daily Stop smoking , use nicotine lozenges 4mg  6-8 per day Return 2 months

## 2014-08-25 NOTE — Progress Notes (Signed)
Subjective:    Patient ID: Kathryn Buckley, female    DOB: 1951/03/07, 63 y.o.   MRN: [id number]  HPI Comments: Chronic dyspnea and cough, 70yrs +.  No real Dx offered.  NP referral: low O2 sats. Now on oxygen ,uses [facility] 2Litres, prn daytime. No portable system  Shortness of Breath This is a chronic problem. The current episode started more than 1 year ago. The problem occurs daily (dyspnea exertional only, ADLs, cooking). The problem has been gradually worsening. Associated symptoms include rhinorrhea, sputum production and wheezing. Pertinent negatives include no abdominal pain, chest pain, claudication, coryza, ear pain, fever, headaches, hemoptysis, leg pain, leg swelling, neck pain, orthopnea, PND, rash, sore throat, swollen glands, syncope or vomiting. The symptoms are aggravated by any activity, exercise, fumes, odors, pollens, URIs and weather changes. Associated symptoms comments: Chronic mucus ,clear to white Notes nasal congestion, current sinusitis. Risk factors include smoking (smokes 2PPD). Treatments tried: oxygen. The treatment provided moderate relief. Her past medical history is significant for COPD. There is no history of allergies, aspirin allergies, asthma, bronchiolitis, CAD, DVT, a heart failure, PE, pneumonia or a recent surgery.   Past Medical History  Diagnosis Date  . Depression   . Thyroid disease   . HTN (hypertension)   . Hyperlipemia   . Seizures     Due to brain tumor that was removed in 2004  . COPD (chronic obstructive pulmonary disease)      Family History  Problem Relation Age of Onset  . Bipolar disorder Father   . Heart disease Father   . Cancer Father     prostate  . Heart disease Mother   . Hypertension Brother   . Diabetes Brother   . Stroke Neg Hx   . Hyperlipidemia Neg Hx      History   Social History  . Marital Status: Married    Spouse Name: N/A    Number of Children: N/A  . Years of Education: N/A   Occupational History  .  Not on file.   Social History Main Topics  . Smoking status: Current Every Day Smoker -- 2.50 packs/day for 40 years    Types: Cigarettes  . Smokeless tobacco: Not on file  . Alcohol Use: No  . Drug Use: No  . Sexual Activity: Not on file   Other Topics Concern  . Not on file   Social History Narrative   Lives with husband and 2 chihuahua   She has 2 children- one son died of drug overdose   1 living son- [name]- lives in [place].     2 step sons   She has worked in the past in Research [employer], [employer])   She enjoys TV- likes to be home.              Allergies  Allergen Reactions  . Lamictal [Lamotrigine] Rash     Outpatient Prescriptions Prior to Visit  Medication Sig Dispense Refill  . albuterol (PROVENTIL HFA;VENTOLIN HFA) 108 (90 BASE) MCG/ACT inhaler Inhale 2 puffs into the lungs every 6 (six) hours as needed for wheezing or shortness of breath. 1 Inhaler 2  . citalopram (CELEXA) 40 MG tablet Take 1 tablet (40 mg total) by mouth daily. 90 tablet 0  . esomeprazole (NEXIUM) 40 MG capsule Take 1 capsule (40 mg total) by mouth daily before breakfast. 90 capsule 3  . levothyroxine (SYNTHROID, LEVOTHROID) 150 MCG tablet TAKE 1 TABLET BY MOUTH EVERY DAY 90 tablet 1  . lithium  carbonate (LITHOBID) 300 MG CR tablet Take 2 at HS 180 tablet 0  . QUEtiapine (SEROQUEL XR) 400 MG 24 hr tablet Take 1 tablet (400 mg total) by mouth at bedtime. 90 tablet 0  . simvastatin (ZOCOR) 40 MG tablet TAKE 1 TABLET EVERY EVENEING 90 tablet 0  . lisinopril (PRINIVIL,ZESTRIL) 20 MG tablet Take 1 tablet (20 mg total) by mouth daily. 30 tablet 2  . budesonide-formoterol (SYMBICORT) 160-4.5 MCG/ACT inhaler Inhale 2 puffs into the lungs 2 (two) times daily. 1 Inhaler 3   No facility-administered medications prior to visit.       Review of Systems  Constitutional: Positive for fatigue. Negative for fever, diaphoresis and unexpected weight change.  HENT: Positive for congestion, postnasal  drip, rhinorrhea, sinus pressure and trouble swallowing. Negative for dental problem, ear pain, nosebleeds, sneezing, sore throat and voice change.   Eyes: Negative for redness and itching.  Respiratory: Positive for cough, sputum production, shortness of breath and wheezing. Negative for hemoptysis, choking and chest tightness.   Cardiovascular: Negative for chest pain, palpitations, orthopnea, claudication, leg swelling, syncope and PND.       Hx of GERD and nexium relieves this   Gastrointestinal: Negative for nausea, vomiting and abdominal pain.  Genitourinary: Negative for dysuria.  Musculoskeletal: Negative for joint swelling and neck pain.  Skin: Negative for rash.  Neurological: Negative for headaches.  Hematological: Does not bruise/bleed easily.  Psychiatric/Behavioral: Positive for dysphoric mood. The patient is nervous/anxious.        Objective:   Physical Exam Filed Vitals:   08/25/14 1149  BP: 149/96  Pulse: 86  Temp: 98.2 F (36.8 C)  TempSrc: Oral  Height: 5\' 9"  (1.753 m)  Weight: 185 lb (83.915 kg)  SpO2: 90%    Gen: Pleasant, well-nourished, in no distress,  normal affect  ENT: No lesions,  mouth clear,  oropharynx clear, no postnasal drip  Neck: No JVD, no TMG, no carotid bruits  Lungs: No use of accessory muscles, no dullness to percussion, distant BS   Cardiovascular: RRR, heart sounds normal, no murmur or gallops, no peripheral edema  Abdomen: soft and NT, no HSM,  BS normal  Musculoskeletal: No deformities, no cyanosis or clubbing  Neuro: alert, non focal  Skin: Warm, no lesions or rashes  No results found.        Assessment & Plan:   COPD (chronic obstructive pulmonary disease) Gold C with chronic bronchitis and active smoking use  Copd gold C with ongoing tobacco use and flare Ace inhibitor contributing to cough Severe hypoxemia, 86% at rest Plan We will obtain portable oxygen 2Liters from [facility] one puff daily Stop  lisinopril  Start losartan 100mg  daily Stop smoking , use nicotine lozenges 4mg  6-8 per day Return 2 months   Chronic respiratory failure with hypoxia Chronic resp failure  Increase oxygen to 2L continuous   Updated Medication List Outpatient Encounter Prescriptions as of 08/25/2014  Medication Sig  . albuterol (PROVENTIL HFA;VENTOLIN HFA) 108 (90 BASE) MCG/ACT inhaler Inhale 2 puffs into the lungs every 6 (six) hours as needed for wheezing or shortness of breath.  . citalopram (CELEXA) 40 MG tablet Take 1 tablet (40 mg total) by mouth daily.  Marland Kitchen esomeprazole (NEXIUM) 40 MG capsule Take 1 capsule (40 mg total) by mouth daily before breakfast.  . levothyroxine (SYNTHROID, LEVOTHROID) 150 MCG tablet TAKE 1 TABLET BY MOUTH EVERY DAY  . lithium carbonate (LITHOBID) 300 MG CR tablet Take 2 at HS  . QUEtiapine (  SEROQUEL XR) 400 MG 24 hr tablet Take 1 tablet (400 mg total) by mouth at bedtime.  . simvastatin (ZOCOR) 40 MG tablet TAKE 1 TABLET EVERY EVENEING  . [DISCONTINUED] lisinopril (PRINIVIL,ZESTRIL) 20 MG tablet Take 1 tablet (20 mg total) by mouth daily.  . Fluticasone Furoate-Vilanterol (BREO ELLIPTA) 100-25 MCG/INH AEPB Inhale 1 puff into the lungs daily.  Marland Kitchen losartan (COZAAR) 100 MG tablet Take 0.5 tablets (50 mg total) by mouth daily.  . [DISCONTINUED] budesonide-formoterol (SYMBICORT) 160-4.5 MCG/ACT inhaler Inhale 2 puffs into the lungs 2 (two) times daily.

## 2014-08-26 NOTE — Assessment & Plan Note (Signed)
Chronic resp failure  Increase oxygen to 2L continuous

## 2014-08-26 NOTE — Assessment & Plan Note (Signed)
Copd gold C with ongoing tobacco use and flare Ace inhibitor contributing to cough Severe hypoxemia, 86% at rest Plan We will obtain portable oxygen 2Liters from [facility] one puff daily Stop lisinopril  Start losartan 100mg  daily Stop smoking , use nicotine lozenges 4mg  6-8 per day Return 2 months

## 2014-09-22 ENCOUNTER — Telehealth: Payer: Self-pay | Admitting: Critical Care Medicine

## 2014-09-22 MED ORDER — FLUTICASONE FUROATE-VILANTEROL 100-25 MCG/INH IN AEPB: 1.0000 | INHALATION_SPRAY | Freq: Every day | RESPIRATORY_TRACT | Status: DC

## 2014-09-22 NOTE — Telephone Encounter (Signed)
Called and spoke with pt and she stated that the pharmacy did not get the rx for the breo.  This has been sent in again.  She stated that losartan she has been taking but she has been taking this wrong.  She thought it was for a full tablet and she was to take a half tablet daily.  She stated that the pharmacy will work something out with her for this medication.  Nothing further is needed.

## 2014-09-28 ENCOUNTER — Telehealth: Payer: Self-pay | Admitting: Family

## 2014-09-28 DIAGNOSIS — J9611 Chronic respiratory failure with hypoxia: Secondary | ICD-10-CM

## 2014-09-28 NOTE — Telephone Encounter (Signed)
Caller name:Shams Jenevieve Relation to PO:[phone] Call back number:[phone] Pharmacy:  Reason for call: pt states she would like to speak with [facility] regarding something melissa set up with her for [facility] states she has questions about it

## 2014-09-28 NOTE — Telephone Encounter (Signed)
Since he is managing her oxygen, I will forward this request to him.

## 2014-09-28 NOTE — Telephone Encounter (Signed)
Dr Joya Gaskins did the referral to [place] for her oxygen.  He will need to forward to someone else  Or you will have to do a new referral

## 2014-09-28 NOTE — Telephone Encounter (Signed)
Spoke with pt, states [facility] told pt home O2 would be $32 per month. O2 was set up and pt states 2 weeks later bank account had charge for $175 from [place]. Pt states she spoke with [facility] and they told her it was a one time charge and pt states she confirmed that her monthly payment is supposed to be #32/month. Pt states she never gave [facility] approval to do automatic withdrawal from account.  Pt then spoke with the business office and was told her monthly fee would be $175 per month.  Pt is very upset and states she wants to continue the oxygen but does not want to use [facility] any longer.  Please advise.

## 2014-09-28 NOTE — Telephone Encounter (Signed)
Marj- could you please send referral to another agency for oxygen?

## 2014-09-29 NOTE — Telephone Encounter (Signed)
i am ok with referring pt to [facility]

## 2014-10-06 ENCOUNTER — Telehealth: Payer: Self-pay | Admitting: Critical Care Medicine

## 2014-10-06 NOTE — Telephone Encounter (Signed)
lmtcb for melissa 

## 2014-10-06 NOTE — Telephone Encounter (Signed)
[name] °

## 2014-10-06 NOTE — Telephone Encounter (Signed)
LMTCB x 1 for [facility] to return call.

## 2014-10-06 NOTE — Telephone Encounter (Signed)
Spoke with Kathryn Buckley, states that there was an issue with 02 payment with this pt.  The patient was being charged more than she was initially quoted by [facility], was looking to switch dme companies.  [facility] has worked out her payment plan and refunded her the extra that she was charged.  The patient is now happy and will be staying with [facility].  Nothing further needed, just fyi.

## 2014-10-10 ENCOUNTER — Telehealth: Payer: Self-pay | Admitting: Family

## 2014-10-10 DIAGNOSIS — H02402 Unspecified ptosis of left eyelid: Secondary | ICD-10-CM

## 2014-10-10 NOTE — Telephone Encounter (Signed)
Caller name: Namira, Hennesy Relation to pt: self  Call back number: [phone]   Reason for call:  Pt requesting a referral to Ophthalmology R. Kandis Ban, M.D. Pt insurance has changed to [facility] member ID# [id number] Group# [id number]  [facility].

## 2014-10-10 NOTE — Telephone Encounter (Signed)
Spoke with pt and referral placed. Please advise do we need to ok this for her surgery? Or should opthalmology do that?

## 2014-10-10 NOTE — Telephone Encounter (Signed)
Referral done, only needed referral to Dr Rosendo Gros. Auth # [id number]

## 2014-10-24 ENCOUNTER — Telehealth: Payer: Self-pay | Admitting: Family

## 2014-10-24 MED ORDER — ESOMEPRAZOLE MAGNESIUM 40 MG PO CPDR: 40.0000 mg | DELAYED_RELEASE_CAPSULE | Freq: Every day | ORAL | Status: DC

## 2014-10-24 NOTE — Telephone Encounter (Signed)
Spoke with pt. She was previously getting nexium through [facility] pt assistance and program is dropping med due to so many generic versions available. Pt states she has tried Prilosec and zantac and symptoms are not well controlled. Pt request rx of generic nexium be sent to her pharmacy. Rx sent.  Pt is past due for follow up with PCP and states she is unable to make appt at this time as she is still undergoing care for "cancer that was recently removed behind her eye".

## 2014-10-24 NOTE — Telephone Encounter (Signed)
Caller name:Camberos, Caren Griffins Relation to PO:[phone] Call back number:[phone] Pharmacy:  Reason for call: pt would like for you to call her regarding her rx nexium, states she is a program and they will no longer have nexium so she wants to know if she can get it in the generic brand.

## 2014-10-26 ENCOUNTER — Other Ambulatory Visit: Payer: Self-pay | Admitting: Family

## 2014-10-26 MED ORDER — PANTOPRAZOLE SODIUM 40 MG PO TBEC: 40.0000 mg | DELAYED_RELEASE_TABLET | Freq: Every day | ORAL | Status: DC

## 2014-10-26 NOTE — Telephone Encounter (Signed)
rx sent for protonix

## 2014-10-26 NOTE — Addendum Note (Signed)
Addended by: Debbrah Alar on: 10/26/2014 01:19 PM   Modules accepted: Orders, Medications

## 2014-10-26 NOTE — Telephone Encounter (Signed)
Ok to send 90 day supply please.

## 2014-10-26 NOTE — Telephone Encounter (Signed)
Please cancel rx for nexium that I sent to her pharmacy on 2/1.

## 2014-10-26 NOTE — Telephone Encounter (Signed)
Patient states that her insurance will cover pancoprazole  [facility] on [address]

## 2014-10-26 NOTE — Telephone Encounter (Signed)
Spoke with pharmacy and cancelled nexium Rx.

## 2014-10-27 NOTE — Telephone Encounter (Signed)
90 day supply sent to pharmacy

## 2014-11-01 ENCOUNTER — Telehealth (HOSPITAL_COMMUNITY): Payer: Self-pay

## 2014-11-01 NOTE — Telephone Encounter (Signed)
Telephone call from patient requesting a refill of her Seroquel 90 day supply through [facility] patient assistance program.  Patient reported she had "about a weeks worth left" so would like this faxed or called in so it would arrive on time prior to her running out.  Patient requested to please check and make sure they were sending to the right address as this has been an issue in the past.  Patient reported no change in her address and verified it was [place], [place] [address].  Agreed to send request to Dr. Adele Schilder and will fax in or call in once order approved.  Patient agreed to keep next evaluation set for 11/10/14 with Dr. Adele Schilder.

## 2014-11-02 NOTE — Telephone Encounter (Signed)
She is okay to get refills on her Seroquel from  [place].

## 2014-11-03 ENCOUNTER — Ambulatory Visit: Payer: Self-pay | Admitting: Critical Care Medicine

## 2014-11-04 ENCOUNTER — Other Ambulatory Visit (HOSPITAL_COMMUNITY): Payer: Self-pay

## 2014-11-04 DIAGNOSIS — F3131 Bipolar disorder, current episode depressed, mild: Secondary | ICD-10-CM

## 2014-11-04 MED ORDER — QUETIAPINE FUMARATE ER 400 MG PO TB24: 400.0000 mg | ORAL_TABLET | Freq: Every day | ORAL | Status: DC

## 2014-11-04 NOTE — Telephone Encounter (Signed)
Telephone call with patient today to verify need for new 90 day order for Seroquel XR.  Prescription printed out for Dr. Adele Schilder to sign and faxed in to [employer] patient assistance program at [phone].

## 2014-11-08 ENCOUNTER — Telehealth (HOSPITAL_COMMUNITY): Payer: Self-pay | Admitting: *Deleted

## 2014-11-08 NOTE — Telephone Encounter (Signed)
Patient had to reschedule her appointment for 11-10-2014, needed first week in March due to eye surgery she has to follow up weekly. Patient stated she just got a refill on her medications and she will be okay till her appointment in March.

## 2014-11-10 ENCOUNTER — Ambulatory Visit (HOSPITAL_COMMUNITY): Payer: Self-pay | Admitting: Psychiatry

## 2014-11-21 ENCOUNTER — Telehealth: Payer: Self-pay | Admitting: Family

## 2014-11-21 NOTE — Telephone Encounter (Signed)
Pt is scheduled to see Kathryn Buckley on 11/30/14 but states she doesn't feel like she can wait until appt to discuss med change. Please advise.

## 2014-11-21 NOTE — Telephone Encounter (Signed)
Spoke with pt. She states pantoprazole is not working; has taken x 3 weeks. Tried omeprazole 40mg  over the weekend (that her sister takes) and noted that it helped her symptoms. Pt is requesting Rx for this.  Please advise.

## 2014-11-21 NOTE — Telephone Encounter (Signed)
THE RX SHE GOT LAST WEEK IS NOT WORKING FOR HER  PLEASE CALL IN SOMETHING ELSE TO [facility]  [address] IN [place]

## 2014-11-21 NOTE — Telephone Encounter (Signed)
omprazole is available OTC.  Advise that she take omeprazole 20mg  two tabs once daily until we see her back. Go to ER if chest pain occurs.

## 2014-11-21 NOTE — Telephone Encounter (Signed)
She is overdue for follow up.  Please ask pt to schedule OV and we will discuss her symptoms/rx at her appointment.

## 2014-11-22 ENCOUNTER — Encounter (HOSPITAL_COMMUNITY): Payer: Self-pay | Admitting: Psychiatry

## 2014-11-22 ENCOUNTER — Ambulatory Visit (INDEPENDENT_AMBULATORY_CARE_PROVIDER_SITE_OTHER): Payer: 59 | Admitting: Psychiatry

## 2014-11-22 VITALS — BP 130/82 | HR 82 | Ht 69.0 in | Wt 186.0 lb

## 2014-11-22 DIAGNOSIS — F319 Bipolar disorder, unspecified: Secondary | ICD-10-CM

## 2014-11-22 DIAGNOSIS — F3131 Bipolar disorder, current episode depressed, mild: Secondary | ICD-10-CM

## 2014-11-22 MED ORDER — CITALOPRAM HYDROBROMIDE 40 MG PO TABS: 40.0000 mg | ORAL_TABLET | Freq: Every day | ORAL | Status: DC

## 2014-11-22 MED ORDER — LITHIUM CARBONATE ER 300 MG PO TBCR: EXTENDED_RELEASE_TABLET | ORAL | Status: DC

## 2014-11-22 NOTE — Progress Notes (Signed)
[facility] Progress Note  Kathryn Buckley [id number] 64 y.o.  11/22/2014 4:20 PM  Chief Complaint:  Medication management and follow up.   History of Present Illness: Kathryn Buckley came for her followup appointment.  Recently she had left eye surgery and she is still in pain.  But she is overall doing better on her current psychotropic medication.  She sleeping good.  She denies any major panic attack or any crying spells.  Her energy level is good.  She denies any irritability, anger, mood swing.  Her appetite is okay.  Her vitals are stable.  She wants to continue Seroquel lithium and Celexa. She is getting Seroquel from [facility].  Suicidal Ideation: No Plan Formed: No Patient has means to carry out plan: No  Homicidal Ideation: No Plan Formed: No Patient has means to carry out plan: No  ROS Psychiatric: Agitation: No Hallucination: No Depressed Mood: No Insomnia: No Hypersomnia: No Altered Concentration: No Feels Worthless: No Grandiose Ideas: No Belief In Special Powers: No New/Increased Substance Abuse: No Compulsions: No  Neurologic: Headache: Yes Seizure: Patient has history of seizures after brain surgery.   Paresthesias: No  Medical History:  She has hypertension, hypothyroidism and hyperlipidemia.  She has history of brain tumor status post surgery.  She had seizures however there has been no recent seizure like activity.  Her primary care physician is Kathryn Buckley.   Outpatient Encounter Prescriptions as of 11/22/2014  Medication Sig  . albuterol (PROVENTIL HFA;VENTOLIN HFA) 108 (90 BASE) MCG/ACT inhaler Inhale 2 puffs into the lungs every 6 (six) hours as needed for wheezing or shortness of breath.  . citalopram (CELEXA) 40 MG tablet Take 1 tablet (40 mg total) by mouth daily.  . Fluticasone Furoate-Vilanterol (BREO ELLIPTA) 100-25 MCG/INH AEPB Inhale 1 puff into the lungs daily.  Marland Kitchen levothyroxine (SYNTHROID, LEVOTHROID) 150 MCG  tablet TAKE 1 TABLET BY MOUTH EVERY DAY  . lithium carbonate (LITHOBID) 300 MG CR tablet Take 2 at HS  . losartan (COZAAR) 100 MG tablet Take 0.5 tablets (50 mg total) by mouth daily.  . pantoprazole (PROTONIX) 40 MG tablet TAKE 1 TABLET BY MOUTH DAILY  . QUEtiapine (SEROQUEL XR) 400 MG 24 hr tablet Take 1 tablet (400 mg total) by mouth at bedtime.  . simvastatin (ZOCOR) 40 MG tablet TAKE 1 TABLET EVERY EVENEING  . [DISCONTINUED] citalopram (CELEXA) 40 MG tablet Take 1 tablet (40 mg total) by mouth daily.  . [DISCONTINUED] lithium carbonate (LITHOBID) 300 MG CR tablet Take 2 at HS    Past Psychiatric History/Hospitalization(s): Patient has been seeing in this office since 2008. She was not happy with her current treatment at [facility] . Patient has long history of bipolar disorder. She has tried in the past Lexapro Cymbalta, Paxil , Zoloft and Geodon.  Patient denies any history of suicidal attempt .  Anxiety: Yes Bipolar Disorder: Yes Depression: Yes Mania: Yes Psychosis: No Schizophrenia: No Personality Disorder: No Hospitalization for psychiatric illness: Yes History of Electroconvulsive Shock Therapy: No Prior Suicide Attempts: No  Physical Exam: No results found for this or any previous visit (from the past 2160 hour(s)). Constitutional:  BP 130/82 mmHg  Pulse 82  Ht 5\' 9"  (1.753 m)  Wt 186 lb (84.369 kg)  BMI 27.45 kg/m2  LMP 09/23/1988  General Appearance: alert, oriented, no acute distress and well nourished  Musculoskeletal: Strength & Muscle Tone: within normal limits Gait & Station: normal Patient leans: N/A  Psychiatric: Speech (describe rate, volume, coherence, spontaneity, and  abnormalities if any): Slow but clear and coherent with normal tone and volume.  Thought Process (describe rate, content, abstract reasoning, and computation): Organized logical goal-directed  Associations: Relevant and Intact  Thoughts: normal  Mental Status: Orientation:  oriented to person, place, time/date and situation Mood & Affect: normal affect Attention Span & Concentration: Fair  Established Problem, Stable/Improving (1), Review of Last Therapy Session (1) and Review of Medication Regimen & Side Effects (2)  Assessment: Axis I: Bipolar disorder NOS  Axis II: Deferred  Axis III:  Patient Active Problem List   Diagnosis Date Noted  . Bipolar 1 disorder, mixed, moderate 08/25/2014  . Chronic respiratory failure with hypoxia 08/25/2014  . COPD (chronic obstructive pulmonary disease) Gold C with chronic bronchitis and active smoking use  07/03/2014  . Basal cell carcinoma of cheek 06/04/2013  . Routine general medical examination at a health care facility 10/15/2012  . Abnormal EKG 10/15/2012  . Hypoxia 10/15/2012  . HTN (hypertension) 09/06/2012  . Hypothyroid 09/06/2012  . GERD (gastroesophageal reflux disease) 09/06/2012  . Tobacco abuse 09/06/2012  . Depression 11/18/2011   Plan:  Patient is doing better on her current psychotropic medication.  She has no side effects including any tremors or shakes.  I will continue Celexa 40 mg daily, lithium 600 mg a day Seroquel XR 400 mg at bedtime.  Discussed medication side effects including hyperlipidemia and metabolic syndrome.  Recommended to call us back if she has any question or any concern.  I will see her again in 3 months.   Dinari Stgermaine T., MD 11/22/2014

## 2014-11-22 NOTE — Telephone Encounter (Signed)
Notified pt. She was upset that she will have to purchase otc when it would be cheaper through the insurance. Pt states she has had GERD for years and was fine on Nexium but insurance no longer covers it and doesn't understanding why we won't just change the Rx. Advised her to take OTC omeprazole as instructed below and can discuss with PCP at f/u on 11/30/14.

## 2014-11-30 ENCOUNTER — Ambulatory Visit: Payer: Self-pay | Admitting: Family

## 2014-12-14 ENCOUNTER — Encounter: Payer: Self-pay | Admitting: Family

## 2014-12-14 ENCOUNTER — Ambulatory Visit (INDEPENDENT_AMBULATORY_CARE_PROVIDER_SITE_OTHER): Payer: 59 | Admitting: Family

## 2014-12-14 VITALS — BP 130/80 | HR 86 | Temp 98.6°F | Resp 16 | Ht 69.0 in | Wt 183.8 lb

## 2014-12-14 DIAGNOSIS — F329 Major depressive disorder, single episode, unspecified: Secondary | ICD-10-CM

## 2014-12-14 DIAGNOSIS — K219 Gastro-esophageal reflux disease without esophagitis: Secondary | ICD-10-CM

## 2014-12-14 DIAGNOSIS — R739 Hyperglycemia, unspecified: Secondary | ICD-10-CM | POA: Diagnosis not present

## 2014-12-14 DIAGNOSIS — E785 Hyperlipidemia, unspecified: Secondary | ICD-10-CM | POA: Insufficient documentation

## 2014-12-14 DIAGNOSIS — E039 Hypothyroidism, unspecified: Secondary | ICD-10-CM | POA: Diagnosis not present

## 2014-12-14 DIAGNOSIS — J418 Mixed simple and mucopurulent chronic bronchitis: Secondary | ICD-10-CM

## 2014-12-14 DIAGNOSIS — F32A Depression, unspecified: Secondary | ICD-10-CM

## 2014-12-14 LAB — LIPID PANEL
Cholesterol: 190 mg/dL (ref 0–200)
HDL: 47.2 mg/dL (ref >39.00)
NonHDL: 142.8
TRIGLYCERIDES: 230 mg/dL — AB (ref 0.0–149.0)
Total CHOL/HDL Ratio: 4
VLDL: 46 mg/dL — ABNORMAL HIGH (ref 0.0–40.0)

## 2014-12-14 LAB — BASIC METABOLIC PANEL
BUN: 16 mg/dL (ref 6–23)
CO2: 27 mEq/L (ref 19–32)
CREATININE: 1.17 mg/dL (ref 0.40–1.20)
Calcium: 9.1 mg/dL (ref 8.4–10.5)
Chloride: 107 mEq/L (ref 96–112)
GFR: 49.38 mL/min — AB (ref >60.00)
GLUCOSE: 99 mg/dL (ref 70–99)
Potassium: 4.3 mEq/L (ref 3.5–5.1)
Sodium: 138 mEq/L (ref 135–145)

## 2014-12-14 LAB — HEMOGLOBIN A1C: HEMOGLOBIN A1C: 6.2 % (ref 4.6–6.5)

## 2014-12-14 LAB — LDL CHOLESTEROL, DIRECT: Direct LDL: 118 mg/dL

## 2014-12-14 LAB — TSH: TSH: 0.65 u[IU]/mL (ref 0.35–4.50)

## 2014-12-14 MED ORDER — DEXLANSOPRAZOLE 60 MG PO CPDR: 60.0000 mg | DELAYED_RELEASE_CAPSULE | Freq: Every day | ORAL | Status: DC

## 2014-12-14 NOTE — Assessment & Plan Note (Signed)
Unchanged, continue oxygen, breo.

## 2014-12-14 NOTE — Progress Notes (Signed)
Subjective:    Patient ID: Aminatou Bernardo, female    DOB: 04-Apr-1951, 64 y.o.   MRN: [id number]  HPI  Ms. Luetkemeyer is a 64 yr old female who presents today for follow up.   1) COPD- Currently maintained on breo ellipta and followed by Dr. Joya Gaskins (pulmonology)- on 2 L [place]  2) Hypothyroidism- maintained on synthroid.  Reports + compliance with synthroid, feels well.   3) Depression- Follows with Dr. Adele Schilder, reports stable.  4) Hyperlipidemia-   Lab Results  Component Value Date   CHOL 174 09/02/2012   HDL 38* 09/02/2012   LDLCALC 107* 09/02/2012   TRIG 146 09/02/2012   CHOLHDL 4.6 09/02/2012   Denies myalgia.   Review of Systems    see HPI  Past Medical History  Diagnosis Date  . Depression   . Thyroid disease   . HTN (hypertension)   . Hyperlipemia   . Seizures     Due to brain tumor that was removed in 2004  . COPD (chronic obstructive pulmonary disease)     History   Social History  . Marital Status: Married    Spouse Name: N/A  . Number of Children: N/A  . Years of Education: N/A   Occupational History  . Not on file.   Social History Main Topics  . Smoking status: Current Every Day Smoker -- 2.50 packs/day for 40 years    Types: Cigarettes  . Smokeless tobacco: Never Used  . Alcohol Use: No  . Drug Use: No  . Sexual Activity: Not on file   Other Topics Concern  . Not on file   Social History Narrative   Lives with husband and 2 chihuahua   She has 2 children- one son died of drug overdose   1 living son- [name]- lives in [place].     2 step sons   She has worked in the past in Research [employer], [employer])   She enjoys TV- likes to be home.             Past Surgical History  Procedure Laterality Date  . Brain surgery      Family History  Problem Relation Age of Onset  . Bipolar disorder Father   . Heart disease Father   . Cancer Father     prostate  . Heart disease Mother   . Hypertension Brother   . Diabetes Brother     . Stroke Neg Hx   . Hyperlipidemia Neg Hx     Allergies  Allergen Reactions  . Lamictal [Lamotrigine] Rash  . Codeine     Other reaction(s): GI Upset (intolerance)    Current Outpatient Prescriptions on File Prior to Visit  Medication Sig Dispense Refill  . albuterol (PROVENTIL HFA;VENTOLIN HFA) 108 (90 BASE) MCG/ACT inhaler Inhale 2 puffs into the lungs every 6 (six) hours as needed for wheezing or shortness of breath. 1 Inhaler 2  . citalopram (CELEXA) 40 MG tablet Take 1 tablet (40 mg total) by mouth daily. 90 tablet 0  . Fluticasone Furoate-Vilanterol (BREO ELLIPTA) 100-25 MCG/INH AEPB Inhale 1 puff into the lungs daily. 30 each 11  . levothyroxine (SYNTHROID, LEVOTHROID) 150 MCG tablet TAKE 1 TABLET BY MOUTH EVERY DAY 90 tablet 1  . lithium carbonate (LITHOBID) 300 MG CR tablet Take 2 at HS 180 tablet 0  . losartan (COZAAR) 100 MG tablet Take 0.5 tablets (50 mg total) by mouth daily. 30 tablet 11  . QUEtiapine (SEROQUEL XR) 400 MG 24 hr  tablet Take 1 tablet (400 mg total) by mouth at bedtime. 90 tablet 0  . simvastatin (ZOCOR) 40 MG tablet TAKE 1 TABLET EVERY EVENEING 90 tablet 0   No current facility-administered medications on file prior to visit.    BP 130/80 mmHg  Pulse 86  Temp(Src) 98.6 F (37 C) (Oral)  Resp 16  Ht 5\' 9"  (1.753 m)  Wt 183 lb 12.8 oz (83.371 kg)  BMI 27.13 kg/m2  SpO2 94%  LMP 09/23/1988    Objective:   Physical Exam  Constitutional: She is oriented to person, place, and time. She appears well-developed and well-nourished.  HENT:  Head: Normocephalic and atraumatic.  Cardiovascular: Normal rate and regular rhythm.   No murmur heard. Pulmonary/Chest: Effort normal. She has no rales. She exhibits no tenderness.  Diminished breath sounds throughout.  Musculoskeletal: She exhibits no edema.  Neurological: She is alert and oriented to person, place, and time.  Psychiatric: She has a normal mood and affect. Her behavior is normal. Judgment and  thought content normal.          Assessment & Plan:

## 2014-12-14 NOTE — Assessment & Plan Note (Signed)
Clinically stable obtain tsh

## 2014-12-14 NOTE — Progress Notes (Signed)
Pre visit review using our clinic review tool, if applicable. No additional management support is needed unless otherwise documented below in the visit note. 

## 2014-12-14 NOTE — Assessment & Plan Note (Signed)
Tolerating statin, obtain follow-up lipid panel. 

## 2014-12-14 NOTE — Assessment & Plan Note (Signed)
Stable, management per psychiatry.

## 2014-12-14 NOTE — Assessment & Plan Note (Signed)
protonix did not help her gerd symptoms. Requests rx for omeprazole 40mg . Due to potential drug interaction with citalopram, I have recommended trial of dexilant.

## 2014-12-14 NOTE — Patient Instructions (Addendum)
Please complete lab work prior to leaving.  Schedule wellness visit/physical at the front desk.

## 2014-12-16 ENCOUNTER — Encounter: Payer: Self-pay | Admitting: Family

## 2015-01-04 ENCOUNTER — Other Ambulatory Visit: Payer: Self-pay | Admitting: Family

## 2015-01-04 NOTE — Telephone Encounter (Signed)
Rx request to pharmacy/[facility]  

## 2015-01-12 ENCOUNTER — Telehealth: Payer: Self-pay | Admitting: Family

## 2015-01-12 NOTE — Telephone Encounter (Signed)
Relation to pt: self  Call back [phone] Pharmacy: [id number] - [place], [place] - [address] AT [address] [phone] (Phone) [phone] (Fax)         Reason for call:  Pt requesting a new inhaler pt states she can afford symbicort inhaler. Pt stated she would like to speak with Gilmore Laroche

## 2015-01-13 MED ORDER — BUDESONIDE-FORMOTEROL FUMARATE 160-4.5 MCG/ACT IN AERO: 2.0000 | INHALATION_SPRAY | Freq: Two times a day (BID) | RESPIRATORY_TRACT | Status: DC

## 2015-01-13 NOTE — Telephone Encounter (Signed)
Can we check with patient about using Symbicort $25 co-pay Guarantee Program voucher [good through 09/23/15], and/or would you like to change Rx/SLS Please Advise.

## 2015-01-13 NOTE — Telephone Encounter (Signed)
I think it was [facility] that she was given, but we can try symbicort with voucher- pended below.

## 2015-01-13 NOTE — Telephone Encounter (Signed)
Spoke with pt. She states insurance will now help with inhaler cost and she is requesting that we send Rx for inhaler that was previously recommended to her. Thinks it may have been symbicort?  Please advise.

## 2015-01-13 NOTE — Telephone Encounter (Signed)
Notified pt and placed Rx discount card at front desk for pick up.

## 2015-01-16 ENCOUNTER — Telehealth: Payer: Self-pay | Admitting: *Deleted

## 2015-01-16 NOTE — Telephone Encounter (Signed)
Prior authorization for Symbicort initiated. Awaiting determination. JG//CMA

## 2015-01-25 NOTE — Telephone Encounter (Signed)
PA approved.

## 2015-02-07 ENCOUNTER — Telehealth (HOSPITAL_COMMUNITY): Payer: Self-pay | Admitting: *Deleted

## 2015-02-07 NOTE — Telephone Encounter (Signed)
Patient called and left message on nurses voice mail she needs her medication refilled now for Seroquel. Patient stated she will be out of medication and she has been without it before and cannot have it happen again. Patient is very upset that [facility] are always messing up her RX.   I called patient back and patient was very upset, stated she does not know what to do if she does not get her Seroquel.  Patient stated [facility] upset her and they have been holding on to a RX since 11-04-14 and did not refill it because they could not read the doctors information, no one called her. Patient stated I only think I have a weeks worth left, I cannot be without my medication. I advised patient we did not receive a call from [facility] so I will have to call to find out what happened.  Patient stated we need to do something because she cannot be without her medication and this happens all the time with [facility].  Patient was very upset about the situation.  I advised patient I will call [facility] and call her right back.  Patient verbalized understanding.   I called [facility] at [phone] and spoke with Tayah.  Per Tayah the RX that was faxed on 11-04-14 they cannot read the doctors information at the top so the RX was not filled.  I ask Tayah why did she not notify our office its the middle of May, RX was sent in February.  Per Tayah it is not their responsibility to call us, we need to follow up with [facility] once we fax a RX to them.  I advised Tayah we were not aware of that and normally if a pharmacy cannot read a RX they contact that office. Per Tayah at [facility] they do not contact the provider, the providers office must follow up.  Per Tayah they can fax an emergency RX for Dr. Adele Schilder to fill out and fax back.  Then it will be our responsibility to call [facility] to see if fax was received and medication was shipped to patient.   I called patient back to explain the situation.  Patient stated she was sorry for being so upset earlier but  [facility] get her all in a panic about her medications.  Patient stated she has dealt with [facility] for years and they are getting worse.  Patient stated when she got off the phone with me earlier she went back and counted her pills and she has 22 pills left. I told patient [facility] are faxing over an emergency RX for Dr. Adele Schilder to fill out and for [place] to fax back.  I told patient Dr. Adele Schilder will be back tomorrow.  I advised patient once RX is filled out we will fax it back and check on the RX until it is shipped to her.  Patient stated that she appreciates the help and she is sorry again for being so upset.   I advised Shawn T., RN of situation so he can contact if we have a [facility] drug rep.

## 2015-02-08 ENCOUNTER — Other Ambulatory Visit (HOSPITAL_COMMUNITY): Payer: Self-pay | Admitting: Psychiatry

## 2015-02-08 DIAGNOSIS — F3131 Bipolar disorder, current episode depressed, mild: Secondary | ICD-10-CM

## 2015-02-08 MED ORDER — QUETIAPINE FUMARATE ER 400 MG PO TB24: 400.0000 mg | ORAL_TABLET | Freq: Every day | ORAL | Status: DC

## 2015-02-22 ENCOUNTER — Ambulatory Visit (HOSPITAL_COMMUNITY): Payer: Self-pay | Admitting: Psychiatry

## 2015-03-09 ENCOUNTER — Telehealth (HOSPITAL_COMMUNITY): Payer: Self-pay

## 2015-03-09 NOTE — Telephone Encounter (Signed)
Multiple calls with patient today in an attempt to locate her Seroquel XR which this nurse verified with [facility] they sent out on 02/13/15 to [address]  Obtained the routing number and verified medication was delivered to [facility] on 02/16/15 but unable to locate at this point.  Dr. Adele Schilder and pharmacy did not receive the medication.  Discussed with patient unsure [facility] would refill the medication and will question who's name was on the one they mailed.  Will attempt to have them fill a new order if appropriate.

## 2015-03-10 ENCOUNTER — Encounter (HOSPITAL_COMMUNITY): Payer: Self-pay

## 2015-03-10 ENCOUNTER — Other Ambulatory Visit (HOSPITAL_COMMUNITY): Payer: Self-pay | Admitting: Psychiatry

## 2015-03-10 DIAGNOSIS — F3131 Bipolar disorder, current episode depressed, mild: Secondary | ICD-10-CM

## 2015-03-10 MED ORDER — QUETIAPINE FUMARATE ER 400 MG PO TB24: 400.0000 mg | ORAL_TABLET | Freq: Every day | ORAL | Status: DC

## 2015-03-10 NOTE — Telephone Encounter (Signed)
Met with Dr. Adele Schilder to reviewed prepared letter requesting [facility] mail ordered prescriptions to patient's home and not to [place].  Dr. Adele Schilder wrote a new handwritten prescription to be set to [facility] requesting another 90 day supply of patient's Seroquel XR $RemoveBef'400mg'[id number]$  medication.  New order and letter faxed to [facility] with request they mail out a new 90 day medication refill to patient's home address as soon as possible. Called patient and left a message on her voice mail and a new 90 day prescription order and letter were sent to authorize [facility] to mail medication refill to her home and requested they send upon receiving new order.

## 2015-03-10 NOTE — Telephone Encounter (Signed)
Telephone call with [facility] pertaining to patient's missing medications that were delivered to the address of [facility] on 02/18/15.  Representative reported the order would have had patient and prescriber name on the label.  Informed at this point we have been unable to locate the medication at the hospital and questioned if this could be reordered.  Representative reported they would need a new written prescription faxed in along with a letter from the provider stating it was okay to mail medications to patient's home or they would keep coming back to his office area.  Representative stated he would need to sign the prescription and letter and request order be filled upon being received.

## 2015-03-13 ENCOUNTER — Encounter (HOSPITAL_COMMUNITY): Payer: Self-pay | Admitting: Psychiatry

## 2015-03-13 ENCOUNTER — Ambulatory Visit (INDEPENDENT_AMBULATORY_CARE_PROVIDER_SITE_OTHER): Payer: 59 | Admitting: Psychiatry

## 2015-03-13 VITALS — BP 120/80 | HR 91 | Ht 70.0 in | Wt 188.8 lb

## 2015-03-13 DIAGNOSIS — F3131 Bipolar disorder, current episode depressed, mild: Secondary | ICD-10-CM

## 2015-03-13 DIAGNOSIS — F319 Bipolar disorder, unspecified: Secondary | ICD-10-CM

## 2015-03-13 DIAGNOSIS — J449 Chronic obstructive pulmonary disease, unspecified: Secondary | ICD-10-CM | POA: Diagnosis not present

## 2015-03-13 MED ORDER — LITHIUM CARBONATE ER 300 MG PO TBCR: EXTENDED_RELEASE_TABLET | ORAL | Status: DC

## 2015-03-13 MED ORDER — CITALOPRAM HYDROBROMIDE 40 MG PO TABS: 40.0000 mg | ORAL_TABLET | Freq: Every day | ORAL | Status: DC

## 2015-03-13 NOTE — Progress Notes (Signed)
[place] Progress Note  Kathryn Buckley [id number] 64 y.o.  03/13/2015 2:28 PM  Chief Complaint:  Medication management and follow up.   History of Present Illness: Kathryn Buckley came for her followup appointment.  She was very anxious because she did not receive Seroquel from pharmaceutical company until today.  She mentioned that she need medication because she cannot sleep without Seroquel.  Patient told no new issues and she's been taking the medication as prescribed.  She denies any irritability, anger, panic attack or any crying spells.  Her brother and sister are very supportive.  Patient told off to 2 days she is going to be 64 year old and she will require Medicare and she has mixed feeling about it.  She wants to continue her medication which is helping her depression and anxiety symptoms.  She has no tremors, shakes or any side effects.  Her energy level is good.  Her appetite is okay.  Her vitals are stable.  She denies any drinking or using any illegal substances. She is getting Seroquel from [facility].  She has a regular checkup in March by her primary care physician.  She had blood work and results were reviewed.  Suicidal Ideation: No Plan Formed: No Patient has means to carry out plan: No  Homicidal Ideation: No Plan Formed: No Patient has means to carry out plan: No  ROS Psychiatric: Agitation: No Hallucination: No Depressed Mood: No Insomnia: No Hypersomnia: No Altered Concentration: No Feels Worthless: No Grandiose Ideas: No Belief In Special Powers: No New/Increased Substance Abuse: No Compulsions: No  Neurologic: Headache: Yes Seizure: Patient has history of seizures after brain surgery.   Paresthesias: No  Medical History:  She has hypertension, hypothyroidism and hyperlipidemia.  She has history of brain tumor status post surgery.  She had seizures however there has been no recent seizure like activity.  Her primary care  physician is Kathryn Buckley.   Outpatient Encounter Prescriptions as of 03/13/2015  Medication Sig  . albuterol (PROVENTIL HFA;VENTOLIN HFA) 108 (90 BASE) MCG/ACT inhaler Inhale 2 puffs into the lungs every 6 (six) hours as needed for wheezing or shortness of breath.  . budesonide-formoterol (SYMBICORT) 160-4.5 MCG/ACT inhaler Inhale 2 puffs into the lungs 2 (two) times daily.  . citalopram (CELEXA) 40 MG tablet Take 1 tablet (40 mg total) by mouth daily.  Marland Kitchen dexlansoprazole (DEXILANT) 60 MG capsule Take 1 capsule (60 mg total) by mouth daily.  . Fluticasone Furoate-Vilanterol (BREO ELLIPTA) 100-25 MCG/INH AEPB Inhale 1 puff into the lungs daily.  Marland Kitchen levothyroxine (SYNTHROID, LEVOTHROID) 150 MCG tablet TAKE 1 TABLET BY MOUTH EVERY DAY  . lithium carbonate (LITHOBID) 300 MG CR tablet Take 2 at HS  . losartan (COZAAR) 100 MG tablet Take 0.5 tablets (50 mg total) by mouth daily.  . QUEtiapine (SEROQUEL XR) 400 MG 24 hr tablet Take 1 tablet (400 mg total) by mouth at bedtime.  . simvastatin (ZOCOR) 40 MG tablet TAKE 1 TABLET EVERY EVENEING  . [DISCONTINUED] citalopram (CELEXA) 40 MG tablet Take 1 tablet (40 mg total) by mouth daily.  . [DISCONTINUED] lithium carbonate (LITHOBID) 300 MG CR tablet Take 2 at HS   No facility-administered encounter medications on file as of 03/13/2015.    Past Psychiatric History/Hospitalization(s): Patient has been seeing in this office since 2008. She was not happy with her current treatment at [facility] . Patient has long history of bipolar disorder. She has tried in the past Lexapro Cymbalta, Paxil , Zoloft and Geodon.  Patient  denies any history of suicidal attempt .  Anxiety: Yes Bipolar Disorder: Yes Depression: Yes Mania: Yes Psychosis: No Schizophrenia: No Personality Disorder: No Hospitalization for psychiatric illness: Yes History of Electroconvulsive Shock Therapy: No Prior Suicide Attempts: No  Physical Exam: Recent Results (from the past 2160  hour(s))  Basic metabolic panel     Status: Abnormal   Collection Time: 12/14/14  1:53 PM  Result Value Ref Range   Sodium 138 135 - 145 mEq/L   Potassium 4.3 3.5 - 5.1 mEq/L   Chloride 107 96 - 112 mEq/L   CO2 27 19 - 32 mEq/L   Glucose, Bld 99 70 - 99 mg/dL   BUN 16 6 - 23 mg/dL   Creatinine, Ser 1.17 0.40 - 1.20 mg/dL   Calcium 9.1 8.4 - 10.5 mg/dL   GFR 49.38 (L) >60.00 mL/min  TSH     Status: None   Collection Time: 12/14/14  1:53 PM  Result Value Ref Range   TSH 0.65 0.35 - 4.50 uIU/mL  Lipid panel     Status: Abnormal   Collection Time: 12/14/14  1:53 PM  Result Value Ref Range   Cholesterol 190 0 - 200 mg/dL    Comment: ATP III Classification       Desirable:  < 200 mg/dL               Borderline High:  200 - 239 mg/dL          High:  > = 240 mg/dL   Triglycerides 230.0 (H) 0.0 - 149.0 mg/dL    Comment: Normal:  <150 mg/dLBorderline High:  150 - 199 mg/dL   HDL 47.20 >39.00 mg/dL   VLDL 46.0 (H) 0.0 - 40.0 mg/dL   Total CHOL/HDL Ratio 4     Comment:                Men          Women1/2 Average Risk     3.4          3.3Average Risk          5.0          4.42X Average Risk          9.6          7.13X Average Risk          15.0          11.0                       NonHDL 142.80     Comment: NOTE:  Non-HDL goal should be 30 mg/dL higher than patient's LDL goal (i.e. LDL goal of < 70 mg/dL, would have non-HDL goal of < 100 mg/dL)  Hemoglobin A1c     Status: None   Collection Time: 12/14/14  1:53 PM  Result Value Ref Range   Hgb A1c MFr Bld 6.2 4.6 - 6.5 %    Comment: Glycemic Control Guidelines for People with Diabetes:Non Diabetic:  <6%Goal of Therapy: <7%Additional Action Suggested:  >8%   LDL cholesterol, direct     Status: None   Collection Time: 12/14/14  1:53 PM  Result Value Ref Range   Direct LDL 118.0 mg/dL    Comment: Optimal:  <100 mg/dLNear or Above Optimal:  100-129 mg/dLBorderline High:  130-159 mg/dLHigh:  160-189 mg/dLVery High:  >190 mg/dL    Constitutional:  BP 120/80 mmHg  Pulse 91  Ht 5\' 10"  (1.778 m)  Wt 188 lb 12.8 oz (85.639 kg)  BMI 27.09 kg/m2  LMP 09/23/1988  General Appearance: alert, oriented, no acute distress and well nourished  Musculoskeletal: Strength & Muscle Tone: within normal limits Gait & Station: normal Patient leans: N/A  Psychiatric: Speech (describe rate, volume, coherence, spontaneity, and abnormalities if any): Slow but clear and coherent with normal tone and volume.  Thought Process (describe rate, content, abstract reasoning, and computation): Organized logical goal-directed  Associations: Relevant and Intact  Thoughts: normal  Mental Status: Orientation: oriented to person, place, time/date and situation Mood & Affect: normal affect Attention Span & Concentration: Fair  Established Problem, Stable/Improving (1), Review of Last Therapy Session (1) and Review of Medication Regimen & Side Effects (2)  Assessment: Axis I: Bipolar disorder NOS  Axis II: Deferred  Axis III:  Patient Active Problem List   Diagnosis Date Noted  . Hyperlipidemia 12/14/2014  . Bipolar 1 disorder, mixed, moderate 08/25/2014  . Chronic respiratory failure with hypoxia 08/25/2014  . COPD (chronic obstructive pulmonary disease) Gold C with chronic bronchitis and active smoking use  07/03/2014  . Basal cell carcinoma of cheek 06/04/2013  . Routine general medical examination at a health care facility 10/15/2012  . Abnormal EKG 10/15/2012  . Hypoxia 10/15/2012  . HTN (hypertension) 09/06/2012  . Hypothyroid 09/06/2012  . GERD (gastroesophageal reflux disease) 09/06/2012  . Tobacco abuse 09/06/2012  . Depression 11/18/2011   Plan:  I reviewed collateral information including recent blood work results and hemoglobin A1c.  Patient is fairly stable on her current medication.  Reassurance given since she received her medication from pharmaceutical company today.  At this time patient does not have any  side effects.  I will continue Celexa 40 mg daily, lithium 600 mg a day Seroquel XR 400 mg at bedtime.  Discussed medication side effects including hyperlipidemia and metabolic syndrome.  Recommended to call us back if she has any question or any concern.  I will see her again in 3 months.   Tadashi Burkel T., MD 03/13/2015

## 2015-03-14 DIAGNOSIS — I10 Essential (primary) hypertension: Secondary | ICD-10-CM | POA: Diagnosis not present

## 2015-03-14 DIAGNOSIS — Z9889 Other specified postprocedural states: Secondary | ICD-10-CM | POA: Diagnosis not present

## 2015-03-14 DIAGNOSIS — F17210 Nicotine dependence, cigarettes, uncomplicated: Secondary | ICD-10-CM | POA: Diagnosis not present

## 2015-03-14 DIAGNOSIS — Z09 Encounter for follow-up examination after completed treatment for conditions other than malignant neoplasm: Secondary | ICD-10-CM | POA: Diagnosis not present

## 2015-03-28 DIAGNOSIS — R61 Generalized hyperhidrosis: Secondary | ICD-10-CM | POA: Diagnosis not present

## 2015-03-28 DIAGNOSIS — Z79899 Other long term (current) drug therapy: Secondary | ICD-10-CM | POA: Diagnosis not present

## 2015-03-28 DIAGNOSIS — F319 Bipolar disorder, unspecified: Secondary | ICD-10-CM | POA: Diagnosis not present

## 2015-03-28 DIAGNOSIS — I10 Essential (primary) hypertension: Secondary | ICD-10-CM | POA: Diagnosis not present

## 2015-03-28 DIAGNOSIS — R030 Elevated blood-pressure reading, without diagnosis of hypertension: Secondary | ICD-10-CM | POA: Diagnosis not present

## 2015-03-28 DIAGNOSIS — K219 Gastro-esophageal reflux disease without esophagitis: Secondary | ICD-10-CM | POA: Diagnosis not present

## 2015-03-28 DIAGNOSIS — R42 Dizziness and giddiness: Secondary | ICD-10-CM | POA: Diagnosis not present

## 2015-03-28 DIAGNOSIS — F17210 Nicotine dependence, cigarettes, uncomplicated: Secondary | ICD-10-CM | POA: Diagnosis not present

## 2015-03-28 DIAGNOSIS — E785 Hyperlipidemia, unspecified: Secondary | ICD-10-CM | POA: Diagnosis not present

## 2015-03-28 DIAGNOSIS — J449 Chronic obstructive pulmonary disease, unspecified: Secondary | ICD-10-CM | POA: Diagnosis not present

## 2015-03-28 DIAGNOSIS — R55 Syncope and collapse: Secondary | ICD-10-CM | POA: Diagnosis not present

## 2015-03-28 DIAGNOSIS — E079 Disorder of thyroid, unspecified: Secondary | ICD-10-CM | POA: Diagnosis not present

## 2015-03-28 DIAGNOSIS — R531 Weakness: Secondary | ICD-10-CM | POA: Diagnosis not present

## 2015-03-28 DIAGNOSIS — R6883 Chills (without fever): Secondary | ICD-10-CM | POA: Diagnosis not present

## 2015-04-12 DIAGNOSIS — J449 Chronic obstructive pulmonary disease, unspecified: Secondary | ICD-10-CM | POA: Diagnosis not present

## 2015-04-16 ENCOUNTER — Other Ambulatory Visit: Payer: Self-pay | Admitting: Family

## 2015-04-17 NOTE — Telephone Encounter (Signed)
Levothyroxine refill sent to pharmacy. Pt last seen 11/2014 and advised to schedule medicare wellness exam.  Please call pt to arrange appointment.

## 2015-04-17 NOTE — Telephone Encounter (Signed)
Patient scheduled medicare wellness for 06/05/15 [facility]).

## 2015-05-13 DIAGNOSIS — J449 Chronic obstructive pulmonary disease, unspecified: Secondary | ICD-10-CM | POA: Diagnosis not present

## 2015-06-02 ENCOUNTER — Telehealth: Payer: Self-pay | Admitting: *Deleted

## 2015-06-02 NOTE — Telephone Encounter (Signed)
Patient cancelled appointment.

## 2015-06-05 ENCOUNTER — Ambulatory Visit: Payer: Self-pay | Admitting: Family

## 2015-06-13 ENCOUNTER — Ambulatory Visit (HOSPITAL_COMMUNITY): Payer: Self-pay | Admitting: Psychiatry

## 2015-06-13 DIAGNOSIS — J449 Chronic obstructive pulmonary disease, unspecified: Secondary | ICD-10-CM | POA: Diagnosis not present

## 2015-06-15 ENCOUNTER — Other Ambulatory Visit (HOSPITAL_COMMUNITY): Payer: Self-pay | Admitting: Psychiatry

## 2015-06-21 ENCOUNTER — Ambulatory Visit (INDEPENDENT_AMBULATORY_CARE_PROVIDER_SITE_OTHER): Payer: Self-pay | Admitting: Psychiatry

## 2015-06-21 ENCOUNTER — Encounter (HOSPITAL_COMMUNITY): Payer: Self-pay | Admitting: Psychiatry

## 2015-06-21 VITALS — BP 131/75 | HR 89 | Ht 69.0 in | Wt 189.8 lb

## 2015-06-21 DIAGNOSIS — F3131 Bipolar disorder, current episode depressed, mild: Secondary | ICD-10-CM

## 2015-06-21 MED ORDER — CITALOPRAM HYDROBROMIDE 40 MG PO TABS: 40.0000 mg | ORAL_TABLET | Freq: Every day | ORAL | Status: DC

## 2015-06-21 MED ORDER — LITHIUM CARBONATE ER 300 MG PO TBCR: EXTENDED_RELEASE_TABLET | ORAL | Status: DC

## 2015-06-21 NOTE — Progress Notes (Signed)
[place] Progress Note  Kathryn Buckley [id number] 64 y.o.  06/21/2015 4:25 PM  Chief Complaint:  Medication management and follow up.   History of Present Illness: Kathryn Buckley came for her followup appointment.  She is concerned about her finances.  Since she started to have Medicare she has difficulty affording her medication.  She is not taking Symbicort because it is too expensive.  She realized that she needed to be on a plan that covers her medication.  She is compliant with Seroquel, Celexa and lithium.  She admitted sleeping on and off because she is thinking too much about her finances.  But overall she denies any irritability, anger, mood swing.  She denies any feeling of hopelessness or worthlessness.  She has no tremors or shakes or any EPS.  Her appetite is okay.  Her vitals are stable.  She scheduled to see her primary care physician a few weeks to explore what other medication she can take which are less expensive.  Patient denies drinking or using any illegal substances.  She denies any hallucination or any paranoia.  She was to continue Celexa Seroquel and lithium.  Suicidal Ideation: No Plan Formed: No Patient has means to carry out plan: No  Homicidal Ideation: No Plan Formed: No Patient has means to carry out plan: No  ROS Psychiatric: Agitation: No Hallucination: No Depressed Mood: No Insomnia: No Hypersomnia: No Altered Concentration: No Feels Worthless: No Grandiose Ideas: No Belief In Special Powers: No New/Increased Substance Abuse: No Compulsions: No  Neurologic: Headache: Yes Seizure: Patient has history of seizures after brain surgery.   Paresthesias: No  Medical History:  She has hypertension, hypothyroidism and hyperlipidemia.  She has history of brain tumor status post surgery.  She had seizures however there has been no recent seizure like activity.  Her primary care physician is Kathryn Buckley.   Outpatient Encounter  Prescriptions as of 06/21/2015  Medication Sig  . albuterol (PROVENTIL HFA;VENTOLIN HFA) 108 (90 BASE) MCG/ACT inhaler Inhale 2 puffs into the lungs every 6 (six) hours as needed for wheezing or shortness of breath.  . budesonide-formoterol (SYMBICORT) 160-4.5 MCG/ACT inhaler Inhale 2 puffs into the lungs 2 (two) times daily.  . citalopram (CELEXA) 40 MG tablet Take 1 tablet (40 mg total) by mouth daily.  Marland Kitchen dexlansoprazole (DEXILANT) 60 MG capsule Take 1 capsule (60 mg total) by mouth daily.  . Fluticasone Furoate-Vilanterol (BREO ELLIPTA) 100-25 MCG/INH AEPB Inhale 1 puff into the lungs daily.  Marland Kitchen levothyroxine (SYNTHROID, LEVOTHROID) 150 MCG tablet TAKE 1 TABLET BY MOUTH EVERY DAY  . lithium carbonate (LITHOBID) 300 MG CR tablet Take 2 at HS  . losartan (COZAAR) 100 MG tablet Take 0.5 tablets (50 mg total) by mouth daily.  . QUEtiapine (SEROQUEL XR) 400 MG 24 hr tablet Take 1 tablet (400 mg total) by mouth at bedtime.  . simvastatin (ZOCOR) 40 MG tablet TAKE 1 TABLET EVERY EVENEING  . [DISCONTINUED] citalopram (CELEXA) 40 MG tablet Take 1 tablet (40 mg total) by mouth daily.  . [DISCONTINUED] lithium carbonate (LITHOBID) 300 MG CR tablet Take 2 at HS   No facility-administered encounter medications on file as of 06/21/2015.    Past Psychiatric History/Hospitalization(s): Patient has been seeing in this office since 2008. She was not happy with her current treatment at [facility] . Patient has long history of bipolar disorder. She has tried in the past Lexapro Cymbalta, Paxil , Zoloft and Geodon.  Patient denies any history of suicidal attempt .  Anxiety: Yes Bipolar Disorder: Yes Depression: Yes Mania: Yes Psychosis: No Schizophrenia: No Personality Disorder: No Hospitalization for psychiatric illness: Yes History of Electroconvulsive Shock Therapy: No Prior Suicide Attempts: No  Physical Exam: No results found for this or any previous visit (from the past 2160  hour(s)). Constitutional:  BP 131/75 mmHg  Pulse 89  Ht 5\' 9"  (1.753 m)  Wt 189 lb 12.8 oz (86.093 kg)  BMI 28.02 kg/m2  LMP 09/23/1988  General Appearance: alert, oriented, no acute distress and well nourished  Musculoskeletal: Strength & Muscle Tone: within normal limits Gait & Station: normal Patient leans: N/A  Psychiatric: Speech (describe rate, volume, coherence, spontaneity, and abnormalities if any): Slow but clear and coherent with normal tone and volume.  Thought Process (describe rate, content, abstract reasoning, and computation): Organized logical goal-directed  Associations: Relevant and Intact  Thoughts: normal  Mental Status: Orientation: oriented to person, place, time/date and situation Mood & Affect: anxiety Attention Span & Concentration: Fair  Established Problem, Stable/Improving (1), Review of Last Therapy Session (1) and Review of Medication Regimen & Side Effects (2)  Assessment: Axis I: Bipolar disorder NOS  Axis II: Deferred  Axis III:  Patient Active Problem List   Diagnosis Date Noted  . Hyperlipidemia 12/14/2014  . Bipolar 1 disorder, mixed, moderate 08/25/2014  . Chronic respiratory failure with hypoxia 08/25/2014  . COPD (chronic obstructive pulmonary disease) Gold C with chronic bronchitis and active smoking use  07/03/2014  . Basal cell carcinoma of cheek 06/04/2013  . Routine general medical examination at a health care facility 10/15/2012  . Abnormal EKG 10/15/2012  . Hypoxia 10/15/2012  . HTN (hypertension) 09/06/2012  . Hypothyroid 09/06/2012  . GERD (gastroesophageal reflux disease) 09/06/2012  . Tobacco abuse 09/06/2012  . Depression 11/18/2011   Plan:  Discuss her psychosocial stressors.  Encouraged to enroll in a program that suits her medication coverage.  Patient will see her primary care physician in few weeks to discuss more about that.  She wants to continue her current psychiatric medication.  I will continue  Celexa 40 mg daily, lithium 600 mg daily and Seroquel XR 400 mg at bedtime.  We will consider lithium level on her next appointment.  Discussed medication side effects and benefits.  Recommended to call us back if she has any question or any concern.  Follow-up in 3 months.  She is getting Seroquel XR from pharmaceutical company.   ARFEEN,SYED T., MD 06/21/2015

## 2015-07-12 ENCOUNTER — Telehealth (HOSPITAL_COMMUNITY): Payer: Self-pay

## 2015-07-12 NOTE — Telephone Encounter (Signed)
Medication management - Telephone call with pt to inform pt was in need of a new application for [employer] Patient Assistance and agreed to fill out our part, to get her a new prescription and she will pick up, complete and mail with income proof.  Agreed to call patient back once form was ready for pick up.

## 2015-07-13 ENCOUNTER — Other Ambulatory Visit: Payer: Self-pay | Admitting: Family

## 2015-07-13 DIAGNOSIS — J449 Chronic obstructive pulmonary disease, unspecified: Secondary | ICD-10-CM | POA: Diagnosis not present

## 2015-07-14 ENCOUNTER — Telehealth (HOSPITAL_COMMUNITY): Payer: Self-pay | Admitting: *Deleted

## 2015-07-14 NOTE — Telephone Encounter (Signed)
Attempted to call patient to let her know that paperwork for Seroquel XR has been completed. No answer and voicemail box is not set up. Will try to call again at later time.

## 2015-07-17 ENCOUNTER — Telehealth (HOSPITAL_COMMUNITY): Payer: Self-pay

## 2015-07-17 NOTE — Telephone Encounter (Signed)
Kathryn Buckley, brother-in-law picked up envelope with ppw on [id number] Lic [id number]  dlo

## 2015-07-30 ENCOUNTER — Other Ambulatory Visit: Payer: Self-pay | Admitting: Family

## 2015-07-31 NOTE — Telephone Encounter (Signed)
Medication Detail      Disp Refills Start End     simvastatin (ZOCOR) 40 MG tablet 90 tablet 0 08/08/2014     Sig: TAKE 1 TABLET EVERY EVENEING    E-Prescribing Status: Receipt confirmed by pharmacy (08/08/2014 9:11 AM EST)     Pharmacy    [id number] - [place], [place] - [address]   LOV: 12/14/14 ROV: Patient was instructed to schedule Medicare Wellness in July prior to future refill authorizations; pt Canceled appointment for 06/05/15 Patient will receive one month supply of requested Simvastatin and must schedule & keep Medicare Wellness to receive future medication refills/SLS  Please call patient and schedule Medicare Wellness appointment/SLS Thanks.

## 2015-08-03 NOTE — Telephone Encounter (Signed)
Patient scheduled for 08/07/15

## 2015-08-07 ENCOUNTER — Encounter: Payer: Self-pay | Admitting: Family

## 2015-08-07 ENCOUNTER — Ambulatory Visit (INDEPENDENT_AMBULATORY_CARE_PROVIDER_SITE_OTHER): Payer: Medicare Other | Admitting: Family

## 2015-08-07 VITALS — BP 120/90 | HR 90 | Temp 98.1°F | Resp 18 | Ht 69.0 in

## 2015-08-07 DIAGNOSIS — I10 Essential (primary) hypertension: Secondary | ICD-10-CM

## 2015-08-07 DIAGNOSIS — R0902 Hypoxemia: Secondary | ICD-10-CM

## 2015-08-07 DIAGNOSIS — E781 Pure hyperglyceridemia: Secondary | ICD-10-CM

## 2015-08-07 DIAGNOSIS — Z23 Encounter for immunization: Secondary | ICD-10-CM | POA: Diagnosis not present

## 2015-08-07 DIAGNOSIS — E785 Hyperlipidemia, unspecified: Secondary | ICD-10-CM

## 2015-08-07 DIAGNOSIS — K219 Gastro-esophageal reflux disease without esophagitis: Secondary | ICD-10-CM | POA: Diagnosis not present

## 2015-08-07 DIAGNOSIS — E039 Hypothyroidism, unspecified: Secondary | ICD-10-CM

## 2015-08-07 DIAGNOSIS — J418 Mixed simple and mucopurulent chronic bronchitis: Secondary | ICD-10-CM

## 2015-08-07 MED ORDER — LOSARTAN POTASSIUM 50 MG PO TABS: 50.0000 mg | ORAL_TABLET | Freq: Every day | ORAL | Status: DC

## 2015-08-07 MED ORDER — RANITIDINE HCL 150 MG PO TABS: 150.0000 mg | ORAL_TABLET | Freq: Two times a day (BID) | ORAL | Status: DC

## 2015-08-07 NOTE — Assessment & Plan Note (Signed)
She is now maintained on oxygen via nasal cannula.

## 2015-08-07 NOTE — Assessment & Plan Note (Signed)
Clinically stable on synthroid, obtain follow up TSH.  

## 2015-08-07 NOTE — Progress Notes (Signed)
Subjective:    Patient ID: Kathryn Buckley, female    DOB: 26-Mar-1951, 64 y.o.   MRN: [id number]  HPI  Ms.  Buckley isa 64 yr old female who presents today for follow up.  1) HTN-  On losartan. Trouble cutting the 100mg  in half BP Readings from Last 3 Encounters:  08/07/15 120/90  06/21/15 131/75  03/13/15 120/80   2) Hypothyroid-  Maintained on synthroid 150.  Feels good on current dose.  Lab Results  Component Value Date   TSH 0.65 12/14/2014   3) GERD- would like rx for nexium. Insurance not Dance movement psychotherapist.   4) COPD- reports insurance is not covering symbicort.    5) Depression- currently maintained on seroquel, celexa and lithium. She is followed by Dr. Adele Schilder. Reports mood is good.    Review of Systems See HPI  Past Medical History  Diagnosis Date  . Depression   . Thyroid disease   . HTN (hypertension)   . Hyperlipemia   . Seizures ([place])     Due to brain tumor that was removed in 2004  . COPD (chronic obstructive pulmonary disease) ([place])     Social History   Social History  . Marital Status: Married    Spouse Name: N/A  . Number of Children: N/A  . Years of Education: N/A   Occupational History  . Not on file.   Social History Main Topics  . Smoking status: Current Every Day Smoker -- 2.50 packs/day for 40 years    Types: Cigarettes  . Smokeless tobacco: Never Used  . Alcohol Use: No  . Drug Use: No  . Sexual Activity: Not on file   Other Topics Concern  . Not on file   Social History Narrative   Lives with husband and 2 chihuahua   She has 2 children- one son died of drug overdose   1 living son- Kathryn Buckley- lives in [place].     2 step sons   She has worked in the past in Research officer, [employer])   She enjoys TV- likes to be home.             Past Surgical History  Procedure Laterality Date  . Brain surgery      Family History  Problem Relation Age of Onset  . Bipolar disorder Father   . Heart disease Father   .  Cancer Father     prostate  . Heart disease Mother   . Hypertension Brother   . Diabetes Brother   . Stroke Neg Hx   . Hyperlipidemia Neg Hx     Allergies  Allergen Reactions  . Lamictal [Lamotrigine] Rash  . Codeine     Other reaction(s): GI Upset (intolerance)    Current Outpatient Prescriptions on File Prior to Visit  Medication Sig Dispense Refill  . albuterol (PROVENTIL HFA;VENTOLIN HFA) 108 (90 BASE) MCG/ACT inhaler Inhale 2 puffs into the lungs every 6 (six) hours as needed for wheezing or shortness of breath. 1 Inhaler 2  . budesonide-formoterol (SYMBICORT) 160-4.5 MCG/ACT inhaler Inhale 2 puffs into the lungs 2 (two) times daily. 1 Inhaler 5  . citalopram (CELEXA) 40 MG tablet Take 1 tablet (40 mg total) by mouth daily. 90 tablet 0  . levothyroxine (SYNTHROID, LEVOTHROID) 150 MCG tablet TAKE 1 TABLET BY MOUTH EVERY DAY 90 tablet 1  . lithium carbonate (LITHOBID) 300 MG CR tablet Take 2 at HS 180 tablet 0  . QUEtiapine (SEROQUEL XR) 400 MG 24 hr tablet Take  1 tablet (400 mg total) by mouth at bedtime. 90 tablet 0  . simvastatin (ZOCOR) 40 MG tablet TAKE 1 TABLET BY MOUTH EVERY EVENING 30 tablet 0  . [DISCONTINUED] losartan (COZAAR) 100 MG tablet Take 0.5 tablets (50 mg total) by mouth daily. 30 tablet 11   No current facility-administered medications on file prior to visit.    BP 120/90 mmHg  Pulse 90  Temp(Src) 98.1 F (36.7 C) (Oral)  Resp 18  Ht 5\' 9"  (1.753 m)  SpO2 93%  LMP 09/23/1988       Objective:   Physical Exam  Constitutional: She is oriented to person, place, and time. She appears well-developed and well-nourished.  HENT:  Head: Normocephalic and atraumatic.  Cardiovascular: Normal rate, regular rhythm and normal heart sounds.   No murmur heard. Pulmonary/Chest: Effort normal and breath sounds normal. No respiratory distress. She has no wheezes.  Musculoskeletal: She exhibits no edema.  Neurological: She is alert and oriented to person, place,  and time.  Skin: Skin is warm and dry.  Psychiatric: She has a normal mood and affect. Her behavior is normal. Judgment and thought content normal.          Assessment & Plan:

## 2015-08-07 NOTE — Patient Instructions (Addendum)
Please complete lab work prior to leaving. Stop nexium, start zantac twice daily for reflux.  Follow up in 3 moths.

## 2015-08-07 NOTE — Assessment & Plan Note (Signed)
Not currently taking symbicort, working on patient assist, wishes to restart when pt assist comes through.

## 2015-08-07 NOTE — Assessment & Plan Note (Signed)
BP stable on current meds.  Continue same, obtain vmet.

## 2015-08-07 NOTE — Assessment & Plan Note (Signed)
Obtain flp

## 2015-08-07 NOTE — Progress Notes (Signed)
Pre visit review using our clinic review tool, if applicable. No additional management support is needed unless otherwise documented below in the visit note. 

## 2015-08-07 NOTE — Assessment & Plan Note (Signed)
Due to drug interaction with citalopram, d/c PPI, trial of zantac 150mg  bid

## 2015-08-08 LAB — BASIC METABOLIC PANEL
BUN: 14 mg/dL (ref 6–23)
CALCIUM: 9.5 mg/dL (ref 8.4–10.5)
CO2: 27 mEq/L (ref 19–32)
CREATININE: 1.06 mg/dL (ref 0.40–1.20)
Chloride: 106 mEq/L (ref 96–112)
GFR: 55.23 mL/min — ABNORMAL LOW (ref >60.00)
Glucose, Bld: 121 mg/dL — ABNORMAL HIGH (ref 70–99)
Potassium: 4.5 mEq/L (ref 3.5–5.1)
Sodium: 142 mEq/L (ref 135–145)

## 2015-08-08 LAB — LIPID PANEL
CHOLESTEROL: 234 mg/dL — AB (ref 0–200)
HDL: 41.1 mg/dL (ref >39.00)
LDL Cholesterol: 157 mg/dL — ABNORMAL HIGH (ref 0–99)
NonHDL: 192.5
TRIGLYCERIDES: 178 mg/dL — AB (ref 0.0–149.0)
Total CHOL/HDL Ratio: 6
VLDL: 35.6 mg/dL (ref 0.0–40.0)

## 2015-08-08 LAB — TSH: TSH: 0.6 u[IU]/mL (ref 0.35–4.50)

## 2015-08-09 ENCOUNTER — Other Ambulatory Visit: Payer: Self-pay | Admitting: Family

## 2015-08-09 NOTE — Telephone Encounter (Signed)
Lipid above goal. Advise pt to d/c simvastatin, start atorvastatin.

## 2015-08-11 NOTE — Telephone Encounter (Signed)
Notified pt and she states she had been out of simvastatin for 2 weeks prior to last office visit. She will resume simvastatin.  Please advise if different recommendations?

## 2015-08-13 DIAGNOSIS — J449 Chronic obstructive pulmonary disease, unspecified: Secondary | ICD-10-CM | POA: Diagnosis not present

## 2015-08-13 NOTE — Telephone Encounter (Signed)
No further recommendations.

## 2015-08-22 ENCOUNTER — Telehealth (HOSPITAL_COMMUNITY): Payer: Self-pay

## 2015-08-22 DIAGNOSIS — F3131 Bipolar disorder, current episode depressed, mild: Secondary | ICD-10-CM

## 2015-08-22 MED ORDER — QUETIAPINE FUMARATE ER 400 MG PO TB24: 400.0000 mg | ORAL_TABLET | Freq: Every day | ORAL | Status: DC

## 2015-08-22 NOTE — Telephone Encounter (Signed)
Met with Dr. Adele Schilder after receiving a fax request refill from [employer] for a new 90 day order.  Dr. Adele Schilder filled out paperwork and faxed back new order for continuation of patient's assistance for Seroquel XR $RemoveBef'400mg'[id number]$  medication.

## 2015-08-29 ENCOUNTER — Telehealth: Payer: Self-pay | Admitting: Family

## 2015-08-29 MED ORDER — LANSOPRAZOLE 30 MG PO TBDP: 30.0000 mg | ORAL_TABLET | Freq: Every day | ORAL | Status: DC

## 2015-08-29 NOTE — Telephone Encounter (Signed)
Spoke with pt. She states zantac has not been helping her reflux. Has had reflux and nausea since changing to zantact. Pt states nexium is the only medication that has controlled her GERD. States she has purchased OTC nexium and is taking 2 of the 20mg  tablets a day. States she took that with her other medications for years and had no adverse effects or problems with medication interactions. Pt is asking for [place] to formally place her back on Nexium and send Rx to pharmacy as it is cheaper than OTC.  Please advise.

## 2015-08-29 NOTE — Telephone Encounter (Signed)
Notified pt and she voices understanding. Pt will let us know how she does.

## 2015-08-29 NOTE — Telephone Encounter (Signed)
Please let pt know that potential drug interaction between the nexium and her citalopram is severe and can lead to cardiac arrythmia and possible cardiac arrest.  I think it is too risk to continue nexium. I would recommend that she stop nexium start prevacid (sister medication) along with the zantac.  This is a strong combination which is safer for her.

## 2015-08-29 NOTE — Telephone Encounter (Signed)
Caller name: Akanksha  Relationship to patient: Self  Can be reached: [phone]  Reason for call: pt would like to have CMA call her directly, she has questions about a recent Rx that she started.

## 2015-08-31 ENCOUNTER — Telehealth: Payer: Self-pay | Admitting: Family

## 2015-08-31 NOTE — Telephone Encounter (Signed)
Pt would like to discuss change to Prevacid. Please call her back.

## 2015-08-31 NOTE — Telephone Encounter (Signed)
Initiated PA for prevacid, awaiting determination. JG//CMA

## 2015-09-01 NOTE — Telephone Encounter (Signed)
Attempted to reach pt and received message that voice mailbox has not been set up yet. Will try later.

## 2015-09-01 NOTE — Telephone Encounter (Signed)
Spoke with pt, she states Rx cost is $500. Insurance does not cover medication. Will contact insurance to find out covered alternatives.

## 2015-09-04 NOTE — Telephone Encounter (Signed)
Attempted to reach insurance but call center is closed.  Will try again later.

## 2015-09-04 NOTE — Telephone Encounter (Signed)
Approved through 09/22/2016. Approval letter sent for scanning. JG//CMA

## 2015-09-05 NOTE — Telephone Encounter (Signed)
Spoke with Melanie at [employer]. She states Prevacid was approved and pt's cost will be $95. Other covered alternatives are omeprazole 40mg , protonix and nexium. Protonix was ineffective, states omeprazole and nexium work but we are unable to prescribe due to potential drug drug interaction with celexa. Pt states she will contact her psychiatrist, Dr Adele Schilder and see if he can change her depression medication and will let us know the outcome.

## 2015-09-05 NOTE — Telephone Encounter (Signed)
Noted  

## 2015-09-10 ENCOUNTER — Other Ambulatory Visit: Payer: Self-pay | Admitting: Family

## 2015-09-12 DIAGNOSIS — J449 Chronic obstructive pulmonary disease, unspecified: Secondary | ICD-10-CM | POA: Diagnosis not present

## 2015-09-20 ENCOUNTER — Other Ambulatory Visit (HOSPITAL_COMMUNITY): Payer: Self-pay | Admitting: Psychiatry

## 2015-09-20 DIAGNOSIS — F3131 Bipolar disorder, current episode depressed, mild: Secondary | ICD-10-CM

## 2015-09-21 NOTE — Telephone Encounter (Signed)
Met with Dr. Salem Senate, helping to cover for Dr. Adele Schilder out this week, who approved a new 90 day order for patient prescribed Celexa last ordered 06/21/15 by Dr. Adele Schilder and patient does not return to see him until 10/18/15.  New 90 day order e-scribed to patient's [facility] in [place] as approved by Dr. Salem Senate.

## 2015-09-25 ENCOUNTER — Other Ambulatory Visit (HOSPITAL_COMMUNITY): Payer: Self-pay | Admitting: Psychiatry

## 2015-09-25 DIAGNOSIS — F3131 Bipolar disorder, current episode depressed, mild: Secondary | ICD-10-CM

## 2015-09-27 NOTE — Telephone Encounter (Signed)
Met with Dr. Adele Schilder who approved a new 90 day order for patient's Lithium Carbonate 300 mg, 2 at bedtime, #180 with no refill.  Order e-scribed as approved by Dr. Adele Schilder to patient's [facility] [address] in [place].  Patient to return for next evaluation on 10/18/15.

## 2015-10-13 DIAGNOSIS — J449 Chronic obstructive pulmonary disease, unspecified: Secondary | ICD-10-CM | POA: Diagnosis not present

## 2015-10-18 ENCOUNTER — Encounter (HOSPITAL_COMMUNITY): Payer: Self-pay | Admitting: Psychiatry

## 2015-10-18 ENCOUNTER — Ambulatory Visit (INDEPENDENT_AMBULATORY_CARE_PROVIDER_SITE_OTHER): Payer: Medicare Other | Admitting: Psychiatry

## 2015-10-18 VITALS — BP 129/80 | HR 96 | Ht 69.0 in | Wt 187.8 lb

## 2015-10-18 DIAGNOSIS — F3131 Bipolar disorder, current episode depressed, mild: Secondary | ICD-10-CM | POA: Diagnosis not present

## 2015-10-18 MED ORDER — FLUOXETINE HCL 20 MG PO CAPS: 20.0000 mg | ORAL_CAPSULE | Freq: Every day | ORAL | Status: DC

## 2015-10-18 NOTE — Progress Notes (Signed)
Kathryn Buckley Progress Note  Kathryn Buckley [id number] 65 y.o.  10/18/2015 3:43 PM  Chief Complaint:  I cannot take Celexa because it interferes with Nexium.  I want to try a different antidepressant.     History of Present Illness: Kathryn Buckley came for her followup appointment.  She had a very good Christmas.  She is concerned about her acid reflux.  Her primary care physician tried her on multiple acid reflux medication but she had a very good response with Nexium which was discontinued due to interaction with Celexa.  She like to try a different antidepressant.  Overall she described her mood is good and stable.  She denies any irritability, anger, mood swing.  Her sleep is good.  She denies any crying spells.  She feel good and admitted that she had a very good Christmas this time.  She denies any paranoia or any hallucination.  She has no tremors or shakes.  She denies any recent mania or any impulsive behavior.  Her appetite is okay.  Her vitals are stable.  She is taking lithium and Seroquel.  Recently she's seen primary care physician and she had blood work but lithium level was not done.  Patient denies drinking or using any illegal substances.  Suicidal Ideation: No Plan Formed: No Patient has means to carry out plan: No  Homicidal Ideation: No Plan Formed: No Patient has means to carry out plan: No  ROS Psychiatric: Agitation: No Hallucination: No Depressed Mood: No Insomnia: No Hypersomnia: No Altered Concentration: No Feels Worthless: No Grandiose Ideas: No Belief In Special Powers: No New/Increased Substance Abuse: No Compulsions: No  Neurologic: Headache: Yes Seizure: Patient has history of seizures after brain surgery.   Paresthesias: No  Medical History:  She has hypertension, hypothyroidism and hyperlipidemia.  She has history of brain tumor status post surgery.  She had seizures however there has been no recent seizure like activity.  Her  primary care physician is Lemar Livings.   Outpatient Encounter Prescriptions as of 10/18/2015  Medication Sig  . albuterol (PROVENTIL HFA;VENTOLIN HFA) 108 (90 BASE) MCG/ACT inhaler Inhale 2 puffs into the lungs every 6 (six) hours as needed for wheezing or shortness of breath.  . budesonide-formoterol (SYMBICORT) 160-4.5 MCG/ACT inhaler Inhale 2 puffs into the lungs 2 (two) times daily.  Marland Kitchen FLUoxetine (PROZAC) 20 MG capsule Take 1 capsule (20 mg total) by mouth daily.  . lansoprazole (PREVACID SOLUTAB) 30 MG disintegrating tablet Take 1 tablet (30 mg total) by mouth daily.  Marland Kitchen levothyroxine (SYNTHROID, LEVOTHROID) 150 MCG tablet TAKE 1 TABLET BY MOUTH EVERY DAY  . lithium carbonate (LITHOBID) 300 MG CR tablet TAKE 2 TABLETS BY MOUTH AT BEDTIME  . losartan (COZAAR) 50 MG tablet Take 1 tablet (50 mg total) by mouth daily.  . QUEtiapine (SEROQUEL XR) 400 MG 24 hr tablet Take 1 tablet (400 mg total) by mouth at bedtime.  . ranitidine (ZANTAC) 150 MG tablet Take 1 tablet (150 mg total) by mouth 2 (two) times daily.  . simvastatin (ZOCOR) 40 MG tablet TAKE 1 TABLET BY MOUTH EVERY EVENING  . [DISCONTINUED] citalopram (CELEXA) 40 MG tablet TAKE 1 TABLET BY MOUTH DAILY   No facility-administered encounter medications on file as of 10/18/2015.    Past Psychiatric History/Hospitalization(s): Patient has been seeing in this office since 2008. She was not happy with her current treatment at [facility] . Patient has long history of bipolar disorder. She has tried in the past Lexapro Cymbalta, Paxil ,  Zoloft and Geodon.  Patient denies any history of suicidal attempt .  Anxiety: Yes Bipolar Disorder: Yes Depression: Yes Mania: Yes Psychosis: No Schizophrenia: No Personality Disorder: No Hospitalization for psychiatric illness: Yes History of Electroconvulsive Shock Therapy: No Prior Suicide Attempts: No  Physical Exam: Recent Results (from the past 2160 hour(s))  Basic metabolic panel      Status: Abnormal   Collection Time: 08/07/15  2:43 PM  Result Value Ref Range   Sodium 142 135 - 145 mEq/L   Potassium 4.5 3.5 - 5.1 mEq/L   Chloride 106 96 - 112 mEq/L   CO2 27 19 - 32 mEq/L   Glucose, Bld 121 (H) 70 - 99 mg/dL   BUN 14 6 - 23 mg/dL   Creatinine, Ser 1.06 0.40 - 1.20 mg/dL   Calcium 9.5 8.4 - 10.5 mg/dL   GFR 55.23 (L) >60.00 mL/min  Lipid panel     Status: Abnormal   Collection Time: 08/07/15  2:43 PM  Result Value Ref Range   Cholesterol 234 (H) 0 - 200 mg/dL    Comment: ATP III Classification       Desirable:  < 200 mg/dL               Borderline High:  200 - 239 mg/dL          High:  > = 240 mg/dL   Triglycerides 178.0 (H) 0.0 - 149.0 mg/dL    Comment: Normal:  <150 mg/dLBorderline High:  150 - 199 mg/dL   HDL 41.10 >39.00 mg/dL   VLDL 35.6 0.0 - 40.0 mg/dL   LDL Cholesterol 157 (H) 0 - 99 mg/dL   Total CHOL/HDL Ratio 6     Comment:                Men          Women1/2 Average Risk     3.4          3.3Average Risk          5.0          4.42X Average Risk          9.6          7.13X Average Risk          15.0          11.0                       NonHDL 192.50     Comment: NOTE:  Non-HDL goal should be 30 mg/dL higher than patient's LDL goal (i.e. LDL goal of < 70 mg/dL, would have non-HDL goal of < 100 mg/dL)  TSH     Status: None   Collection Time: 08/07/15  2:43 PM  Result Value Ref Range   TSH 0.60 0.35 - 4.50 uIU/mL   Constitutional:  BP 129/80 mmHg  Pulse 96  Ht 5\' 9"  (1.753 m)  Wt 187 lb 12.8 oz (85.186 kg)  BMI 27.72 kg/m2  LMP 09/23/1988  General Appearance: alert, oriented, no acute distress and well nourished  Musculoskeletal: Strength & Muscle Tone: within normal limits Gait & Station: normal Patient leans: N/A  Psychiatric Specialty Exam: Physical Exam  Review of Systems  Constitutional: Negative.   Respiratory: Positive for shortness of breath.   Cardiovascular: Negative for chest pain and palpitations.  Musculoskeletal: Negative.    Skin: Negative for itching and rash.  Neurological: Negative for dizziness and tremors.  Psychiatric/Behavioral: Negative for  depression, suicidal ideas, hallucinations and substance abuse. The patient does not have insomnia.     Blood pressure 129/80, pulse 96, height 5\' 9"  (1.753 m), weight 187 lb 12.8 oz (85.186 kg), last menstrual period 09/23/1988.Body mass index is 27.72 kg/(m^2).  General Appearance: Casual  Eye Contact::  Good  Speech:  Normal Rate  Volume:  Normal  Mood:  Anxious  Affect:  Appropriate  Thought Process:  Coherent  Orientation:  Full (Time, Place, and Person)  Thought Content:  WDL  Suicidal Thoughts:  No  Homicidal Thoughts:  No  Memory:  Immediate;   Good Recent;   Good Remote;   Good  Judgement:  Good  Insight:  Good  Psychomotor Activity:  Normal  Concentration:  Fair  Recall:  [place] of Knowledge:  Good  Language:  Good  Akathisia:  No  Handed:  Right  AIMS (if indicated):     Assets:  Communication Skills Desire for Improvement Financial Resources/Insurance Housing Physical Health Social Support  ADL's:  Intact  Cognition:  WNL  Sleep:       Established Problem, Stable/Improving (1), New problem, with additional work up planned, Review or order clinical lab tests (1), Review and summation of old records (2), Review of Last Therapy Session (1), Review of Medication Regimen & Side Effects (2) and Review of New Medication or Change in Dosage (2)  Assessment: Axis I: Bipolar disorder NOS  Axis II: Deferred  Axis III:  Patient Active Problem List   Diagnosis Date Noted  . Hyperlipidemia 12/14/2014  . Bipolar 1 disorder, mixed, moderate ([place]) 08/25/2014  . Chronic respiratory failure with hypoxia ([place]) 08/25/2014  . COPD (chronic obstructive pulmonary disease) Gold C with chronic bronchitis and active smoking use  07/03/2014  . Basal cell carcinoma of cheek 06/04/2013  . Routine general medical examination at a health care facility  10/15/2012  . Abnormal EKG 10/15/2012  . Hypoxia 10/15/2012  . HTN (hypertension) 09/06/2012  . Hypothyroid 09/06/2012  . GERD (gastroesophageal reflux disease) 09/06/2012  . Tobacco abuse 09/06/2012  . Depression 11/18/2011   Plan:  I review her records, collateral information, recent blood work results and current medication.  She has been a stable on Celexa Seroquel and lithium but concerned about interaction with Celexa and Nexium .  Patient has family history of coronary artery disease .  She had tried different acid reflux medication but did not respond well.  She like to go back on Nexium which helped her in the past.  I will discontinue Celexa and try Prozac 20 mg daily.  Discuss that if she started to have worsening of symptoms that she should call us immediately.  Continue Seroquel 400 mg at bedtime, lithium 300 mg twice a day.  Recommended to call us back if she has any question or any concern.  Reviewed blood work results.  We will order lithium level.  Follow-up in 3 months.  She is getting Seroquel XR from pharmaceutical company.   Bora Broner T., MD 10/18/2015

## 2015-10-24 ENCOUNTER — Telehealth: Payer: Self-pay | Admitting: Family

## 2015-10-24 NOTE — Telephone Encounter (Signed)
Caller name: Self  Can be reached: [phone]  Pharmacy:  [id number] - [place], [place] - [address] AT [place] [phone] (Phone) [phone] (Fax)       Reason for call: Request rx for generic Nexium

## 2015-10-24 NOTE — Telephone Encounter (Signed)
OK to change back to Nexium.

## 2015-10-24 NOTE — Telephone Encounter (Signed)
Please see 10/18/15 office note with behavioral health and advised if ok to change to Nexium?

## 2015-10-25 MED ORDER — ESOMEPRAZOLE MAGNESIUM 40 MG PO CPDR: 40.0000 mg | DELAYED_RELEASE_CAPSULE | Freq: Every day | ORAL | Status: DC

## 2015-10-25 NOTE — Telephone Encounter (Signed)
Rx sent, pt notified. 

## 2015-10-25 NOTE — Telephone Encounter (Signed)
Patient returning your call regarding message below.

## 2015-11-13 DIAGNOSIS — J449 Chronic obstructive pulmonary disease, unspecified: Secondary | ICD-10-CM | POA: Diagnosis not present

## 2015-11-14 ENCOUNTER — Telehealth: Payer: Self-pay | Admitting: Family

## 2015-11-14 MED ORDER — OMEPRAZOLE 20 MG PO CPDR: 20.0000 mg | DELAYED_RELEASE_CAPSULE | Freq: Every day | ORAL | Status: DC

## 2015-11-14 NOTE — Telephone Encounter (Signed)
Spoke with pt and she states that she was taking omeprazole over the counter. It was controlling her symptoms and is cheaper than the Nexium. Pt now wants to change Rx to Omeprazole?  If ok, please advise dose / directions?  Thanks!

## 2015-11-14 NOTE — Telephone Encounter (Signed)
Ok to take otc omeprazole.

## 2015-11-14 NOTE — Telephone Encounter (Signed)
Pt is wanting a prescription for omeprazole.  Please advise?

## 2015-11-14 NOTE — Telephone Encounter (Signed)
rx sent

## 2015-11-14 NOTE — Telephone Encounter (Signed)
Caller name: Self  Can be reached: [phone]   Reason for call: Patient would like to speak with either Doctor or Nurse about the new medication she started last month (Nexium).

## 2015-11-24 ENCOUNTER — Telehealth (HOSPITAL_COMMUNITY): Payer: Self-pay

## 2015-11-24 DIAGNOSIS — F3131 Bipolar disorder, current episode depressed, mild: Secondary | ICD-10-CM

## 2015-11-24 MED ORDER — QUETIAPINE FUMARATE ER 400 MG PO TB24: 400.0000 mg | ORAL_TABLET | Freq: Every day | ORAL | Status: DC

## 2015-11-24 NOTE — Telephone Encounter (Signed)
Fax received from [facility] for a refill of the patients seroquel. The information was filled out and approved by Dr. Adele Schilder.  I faxed back to [facility] and documented in the medication tab.

## 2015-12-11 DIAGNOSIS — J449 Chronic obstructive pulmonary disease, unspecified: Secondary | ICD-10-CM | POA: Diagnosis not present

## 2016-01-03 ENCOUNTER — Telehealth: Payer: Self-pay | Admitting: *Deleted

## 2016-01-03 MED ORDER — OMEPRAZOLE 20 MG PO CPDR: 20.0000 mg | DELAYED_RELEASE_CAPSULE | Freq: Every day | ORAL | Status: DC

## 2016-01-03 MED ORDER — LOSARTAN POTASSIUM 50 MG PO TABS: 50.0000 mg | ORAL_TABLET | Freq: Every day | ORAL | Status: DC

## 2016-01-03 NOTE — Telephone Encounter (Signed)
Received fax from [facility] for new Rxs of losartan and omeprazole. 90 day supply sent of each. Pt was due for f/u with Melissa in 10/2015 and is past due.  Please call pt to schedule appt before further refills are due.  Thanks!

## 2016-01-04 NOTE — Telephone Encounter (Signed)
Pt has been scheduled.  °

## 2016-01-11 DIAGNOSIS — J449 Chronic obstructive pulmonary disease, unspecified: Secondary | ICD-10-CM | POA: Diagnosis not present

## 2016-01-14 ENCOUNTER — Other Ambulatory Visit: Payer: Self-pay | Admitting: Family

## 2016-01-14 ENCOUNTER — Other Ambulatory Visit (HOSPITAL_COMMUNITY): Payer: Self-pay | Admitting: Psychiatry

## 2016-01-16 ENCOUNTER — Telehealth (HOSPITAL_COMMUNITY): Payer: Self-pay

## 2016-01-16 DIAGNOSIS — F3131 Bipolar disorder, current episode depressed, mild: Secondary | ICD-10-CM

## 2016-01-16 MED ORDER — FLUOXETINE HCL 20 MG PO CAPS: 20.0000 mg | ORAL_CAPSULE | Freq: Every day | ORAL | Status: DC

## 2016-01-16 MED ORDER — LITHIUM CARBONATE ER 300 MG PO TBCR: 600.0000 mg | EXTENDED_RELEASE_TABLET | Freq: Every day | ORAL | Status: DC

## 2016-01-16 NOTE — Telephone Encounter (Signed)
Patient called and she will need a refill on Lithium and Prozac, she was scheduled for a follow up this week, but it was rescheduled for 5/10. I called in a one month supply of both the Lithium and the Prozac to get her through until her appointment with Dr. Adele Schilder.

## 2016-01-17 ENCOUNTER — Ambulatory Visit (HOSPITAL_COMMUNITY): Payer: Self-pay | Admitting: Psychiatry

## 2016-01-24 ENCOUNTER — Ambulatory Visit: Payer: Self-pay | Admitting: Family

## 2016-01-31 ENCOUNTER — Encounter (HOSPITAL_COMMUNITY): Payer: Self-pay | Admitting: Psychiatry

## 2016-01-31 ENCOUNTER — Ambulatory Visit (INDEPENDENT_AMBULATORY_CARE_PROVIDER_SITE_OTHER): Payer: Medicare Other | Admitting: Psychiatry

## 2016-01-31 ENCOUNTER — Other Ambulatory Visit (HOSPITAL_COMMUNITY): Payer: Self-pay

## 2016-01-31 VITALS — BP 126/74 | HR 93 | Ht 69.0 in | Wt 186.4 lb

## 2016-01-31 DIAGNOSIS — F3131 Bipolar disorder, current episode depressed, mild: Secondary | ICD-10-CM | POA: Diagnosis not present

## 2016-01-31 DIAGNOSIS — Z79899 Other long term (current) drug therapy: Secondary | ICD-10-CM

## 2016-01-31 MED ORDER — FLUOXETINE HCL 20 MG PO CAPS: 20.0000 mg | ORAL_CAPSULE | Freq: Every day | ORAL | Status: DC

## 2016-01-31 MED ORDER — LITHIUM CARBONATE ER 300 MG PO TBCR: 600.0000 mg | EXTENDED_RELEASE_TABLET | Freq: Every day | ORAL | Status: DC

## 2016-01-31 MED ORDER — QUETIAPINE FUMARATE ER 400 MG PO TB24: 400.0000 mg | ORAL_TABLET | Freq: Every day | ORAL | Status: DC

## 2016-01-31 NOTE — Telephone Encounter (Signed)
Printed patients Seroquel at Dr. Darletta Moll request and faxed it to [facility] for them to fill.

## 2016-01-31 NOTE — Progress Notes (Signed)
[facility] [phone] Progress Note  Kathryn Buckley [id number] 65 y.o.  01/31/2016 1:57 PM  Chief Complaint:  Medication management and follow-up.       History of Present Illness: Kathryn Buckley came for her followup appointment.  She is taking her medication as prescribed.  We switched from Celexa to Prozac because Celexa was interfering with Nexium.  She like Prozac.  In the beginning she was feeling dizzy but now she is tolerating very well.  Her depression is well controlled.  She denies any crying spells, irritability, poor sleep or any feeling of hopelessness.  Her energy level is good.  She apologizes for not having lithium level but promised to do with in few weeks.  She had a scheduled appointment with primary care physician and she will do blood work very soon.  Patient denies any mania, psychosis, hallucination.  She has no tremors or shakes.  Her appetite is okay.  Her vitals are stable.  Patient denies drinking alcohol or using any illegal substances.  Suicidal Ideation: No Plan Formed: No Patient has means to carry out plan: No  Homicidal Ideation: No Plan Formed: No Patient has means to carry out plan: No  ROS Psychiatric: Agitation: No Hallucination: No Depressed Mood: No Insomnia: No Hypersomnia: No Altered Concentration: No Feels Worthless: No Grandiose Ideas: No Belief In Special Powers: No New/Increased Substance Abuse: No Compulsions: No  Neurologic: Headache: Yes Seizure: Patient has history of seizures after brain surgery.   Paresthesias: No  Medical History:  She has hypertension, hypothyroidism and hyperlipidemia.  She has history of brain tumor status post surgery.  She had seizures however there has been no recent seizure like activity.  Her primary care physician is Lemar Livings.   Outpatient Encounter Prescriptions as of 01/31/2016  Medication Sig  . albuterol (PROVENTIL HFA;VENTOLIN HFA) 108 (90 BASE) MCG/ACT inhaler Inhale 2 puffs  into the lungs every 6 (six) hours as needed for wheezing or shortness of breath.  . budesonide-formoterol (SYMBICORT) 160-4.5 MCG/ACT inhaler Inhale 2 puffs into the lungs 2 (two) times daily.  Marland Kitchen esomeprazole (NEXIUM) 40 MG capsule Take 1 capsule (40 mg total) by mouth daily.  Marland Kitchen FLUoxetine (PROZAC) 20 MG capsule Take 1 capsule (20 mg total) by mouth daily.  Marland Kitchen levothyroxine (SYNTHROID, LEVOTHROID) 150 MCG tablet TAKE 1 TABLET BY MOUTH EVERY DAY  . lithium carbonate (LITHOBID) 300 MG CR tablet Take 2 tablets (600 mg total) by mouth at bedtime.  Marland Kitchen losartan (COZAAR) 50 MG tablet Take 1 tablet (50 mg total) by mouth daily.  Marland Kitchen omeprazole (PRILOSEC) 20 MG capsule Take 1 capsule (20 mg total) by mouth daily.  . QUEtiapine (SEROQUEL XR) 400 MG 24 hr tablet Take 1 tablet (400 mg total) by mouth at bedtime.  . ranitidine (ZANTAC) 150 MG tablet Take 1 tablet (150 mg total) by mouth 2 (two) times daily.  . simvastatin (ZOCOR) 40 MG tablet TAKE 1 TABLET BY MOUTH EVERY EVENING  . [DISCONTINUED] FLUoxetine (PROZAC) 20 MG capsule Take 1 capsule (20 mg total) by mouth daily.  . [DISCONTINUED] lithium carbonate (LITHOBID) 300 MG CR tablet Take 2 tablets (600 mg total) by mouth at bedtime.   No facility-administered encounter medications on file as of 01/31/2016.    Past Psychiatric History/Hospitalization(s): Patient has been seeing in this office since 2008. She was not happy with her current treatment at [facility] . Patient has long history of bipolar disorder. She has tried in the past Lexapro Cymbalta, Paxil , Zoloft and  Geodon.  Patient denies any history of suicidal attempt . She also tried Celexa which worked very well but due to depression with Nexium it was switched to Prozac. Anxiety: Yes Bipolar Disorder: Yes Depression: Yes Mania: Yes Psychosis: No Schizophrenia: No Personality Disorder: No Hospitalization for psychiatric illness: Yes History of Electroconvulsive Shock Therapy: No Prior Suicide  Attempts: No  Physical Exam: No results found for this or any previous visit (from the past 2160 hour(s)). Constitutional:  BP 126/74 mmHg  Pulse 93  Ht 5\' 9"  (1.753 m)  Wt 186 lb 6.4 oz (84.55 kg)  BMI 27.51 kg/m2  LMP 09/23/1988  General Appearance: alert, oriented, no acute distress and well nourished  Musculoskeletal: Strength & Muscle Tone: within normal limits Gait & Station: normal Patient leans: N/A  Psychiatric Specialty Exam: Physical Exam  Review of Systems  Constitutional: Negative.   Respiratory: Positive for shortness of breath.   Cardiovascular: Negative for chest pain and palpitations.  Musculoskeletal: Negative.   Skin: Negative for itching and rash.  Neurological: Negative for dizziness and tremors.  Psychiatric/Behavioral: Negative for depression, suicidal ideas, hallucinations and substance abuse. The patient does not have insomnia.     Blood pressure 126/74, pulse 93, height 5\' 9"  (1.753 m), weight 186 lb 6.4 oz (84.55 kg), last menstrual period 09/23/1988.Body mass index is 27.51 kg/(m^2).  General Appearance: Casual  Eye Contact::  Good  Speech:  Normal Rate  Volume:  Normal  Mood:  Anxious  Affect:  Appropriate  Thought Process:  Coherent  Orientation:  Full (Time, Place, and Person)  Thought Content:  WDL  Suicidal Thoughts:  No  Homicidal Thoughts:  No  Memory:  Immediate;   Good Recent;   Good Remote;   Good  Judgement:  Good  Insight:  Good  Psychomotor Activity:  Normal  Concentration:  Fair  Recall:  [place] of Knowledge:  Good  Language:  Good  Akathisia:  No  Handed:  Right  AIMS (if indicated):     Assets:  Communication Skills Desire for Improvement Financial Resources/Insurance Housing Physical Health Social Support  ADL's:  Intact  Cognition:  WNL  Sleep:       Established Problem, Stable/Improving (1), Review of Last Therapy Session (1) and Review of Medication Regimen & Side Effects (2)  Assessment: Axis I:  Bipolar disorder NOS  Axis II: Deferred  Axis III:  Patient Active Problem List   Diagnosis Date Noted  . Hyperlipidemia 12/14/2014  . Bipolar 1 disorder, mixed, moderate ([place]) 08/25/2014  . Chronic respiratory failure with hypoxia ([place]) 08/25/2014  . COPD (chronic obstructive pulmonary disease) Gold C with chronic bronchitis and active smoking use  07/03/2014  . Basal cell carcinoma of cheek 06/04/2013  . Routine general medical examination at a health care facility 10/15/2012  . Abnormal EKG 10/15/2012  . Hypoxia 10/15/2012  . HTN (hypertension) 09/06/2012  . Hypothyroid 09/06/2012  . GERD (gastroesophageal reflux disease) 09/06/2012  . Tobacco abuse 09/06/2012  . Depression 11/18/2011   Plan:  Patient is a stable on Prozac 20 mg daily, lithium 300 mg twice a day and Seroquel XR 400 mg at bedtime.  Recommended to call us back if she has any question or any concern.  Reinforce lithium level.  Discussed medication side effects and benefits.  Follow-up in 3 months.  She is getting Seroquel XR from pharmaceutical company.   Ceazia Harb T., MD 01/31/2016

## 2016-02-10 DIAGNOSIS — J449 Chronic obstructive pulmonary disease, unspecified: Secondary | ICD-10-CM | POA: Diagnosis not present

## 2016-02-14 ENCOUNTER — Telehealth: Payer: Self-pay | Admitting: Family

## 2016-02-14 ENCOUNTER — Ambulatory Visit: Payer: Medicare Other | Admitting: Family

## 2016-02-14 NOTE — Telephone Encounter (Signed)
No thanks  

## 2016-02-14 NOTE — Telephone Encounter (Signed)
Patient lvm 02/14/16 at 9:25am cancelling her 1:5pm patient [facility] to 03/01/16. Charge or no charge

## 2016-02-16 ENCOUNTER — Other Ambulatory Visit: Payer: Self-pay | Admitting: Family

## 2016-02-21 ENCOUNTER — Other Ambulatory Visit: Payer: Self-pay | Admitting: Family

## 2016-03-01 ENCOUNTER — Ambulatory Visit (INDEPENDENT_AMBULATORY_CARE_PROVIDER_SITE_OTHER): Payer: Medicare Other | Admitting: Family

## 2016-03-01 ENCOUNTER — Encounter: Payer: Self-pay | Admitting: Family

## 2016-03-01 VITALS — BP 138/80 | HR 97 | Temp 98.3°F | Resp 18 | Ht 69.0 in | Wt 186.6 lb

## 2016-03-01 DIAGNOSIS — E039 Hypothyroidism, unspecified: Secondary | ICD-10-CM | POA: Diagnosis not present

## 2016-03-01 DIAGNOSIS — F3162 Bipolar disorder, current episode mixed, moderate: Secondary | ICD-10-CM

## 2016-03-01 DIAGNOSIS — E785 Hyperlipidemia, unspecified: Secondary | ICD-10-CM | POA: Diagnosis not present

## 2016-03-01 DIAGNOSIS — I10 Essential (primary) hypertension: Secondary | ICD-10-CM | POA: Diagnosis not present

## 2016-03-01 DIAGNOSIS — J449 Chronic obstructive pulmonary disease, unspecified: Secondary | ICD-10-CM | POA: Diagnosis not present

## 2016-03-01 LAB — LIPID PANEL
Cholesterol: 212 mg/dL — ABNORMAL HIGH (ref 0–200)
HDL: 36.2 mg/dL — ABNORMAL LOW (ref >39.00)
Total CHOL/HDL Ratio: 6

## 2016-03-01 LAB — TSH: TSH: 3.01 u[IU]/mL (ref 0.35–4.50)

## 2016-03-01 LAB — LDL CHOLESTEROL, DIRECT: LDL DIRECT: 134 mg/dL

## 2016-03-01 NOTE — Assessment & Plan Note (Signed)
BP stable on losartan, continue same.

## 2016-03-01 NOTE — Assessment & Plan Note (Signed)
Clinically stable, managed by psychiatry.

## 2016-03-01 NOTE — Patient Instructions (Signed)
Please complete lab work prior to leaving. You will be contacted about your referral to Dr. Elsworth Soho (pulmonary). Please let me know when you are ready to quit smoking and we can help you.  Follow up in 3 months.

## 2016-03-01 NOTE — Progress Notes (Signed)
Subjective:    Patient ID: Kathryn Buckley, female    DOB: 10/29/1950, 65 y.o.   MRN: [id number]  HPI  Kathryn Buckley is a 65 yr old female who presents today for follow up:  1) HTN- currently maintained on losartan 50mg .  BP Readings from Last 3 Encounters:  03/01/16 138/80  01/31/16 126/74  10/18/15 129/80   2) Hyperlipidemia- maintained on simvastatin 40mg . Denies myalgia.  Lab Results  Component Value Date   CHOL 234* 08/07/2015   HDL 41.10 08/07/2015   LDLCALC 157* 08/07/2015   LDLDIRECT 118.0 12/14/2014   TRIG 178.0* 08/07/2015   CHOLHDL 6 08/07/2015   3) Hypothyroid- continues synthroid 19mcg once daily. Lab Results  Component Value Date   TSH 0.60 08/07/2015   4) BP/Depression- on seroquel xr. Reports good mood. Follows with psychiatry. (Dr. Adele Schilder)  5) COPD- wakes up sob sometimes when oxygen has fallen off. Finds that she becomes sob if she does not wear oxygen.  Wearing continuously now. She does continue to smoke 2PPD.  She reports that she never wears oxygen when she smokes.  Overall, reports breathing has deteriorated this year despite compliance with inhalers.    Review of Systems See HPI  Past Medical History  Diagnosis Date  . Depression   . Thyroid disease   . HTN (hypertension)   . Hyperlipemia   . Seizures ([place])     Due to brain tumor that was removed in 2004  . COPD (chronic obstructive pulmonary disease) ([place])      Social History   Social History  . Marital Status: Married    Spouse Name: N/A  . Number of Children: N/A  . Years of Education: N/A   Occupational History  . Not on file.   Social History Main Topics  . Smoking status: Current Every Day Smoker -- 2.50 packs/day for 40 years    Types: Cigarettes  . Smokeless tobacco: Never Used  . Alcohol Use: No  . Drug Use: No  . Sexual Activity: Not on file   Other Topics Concern  . Not on file   Social History Narrative   Lives with husband and 2 chihuahua   She has 2  children- one son died of drug overdose   1 living son- [name]- lives in [place].     2 step sons   She has worked in the past in Research officer, trade [employer])   She enjoys TV- likes to be home.             Past Surgical History  Procedure Laterality Date  . Brain surgery      Family History  Problem Relation Age of Onset  . Bipolar disorder Father   . Heart disease Father   . Cancer Father     prostate  . Heart disease Mother   . Hypertension Brother   . Diabetes Brother   . Stroke Neg Hx   . Hyperlipidemia Neg Hx     Allergies  Allergen Reactions  . Lamictal [Lamotrigine] Rash  . Codeine     Other reaction(s): GI Upset (intolerance)    Current Outpatient Prescriptions on File Prior to Visit  Medication Sig Dispense Refill  . FLUoxetine (PROZAC) 20 MG capsule Take 1 capsule (20 mg total) by mouth daily. 90 capsule 0  . levothyroxine (SYNTHROID, LEVOTHROID) 150 MCG tablet TAKE 1 TABLET BY MOUTH EVERY DAY 90 tablet 1  . lithium carbonate (LITHOBID) 300 MG CR tablet Take 2 tablets (600 mg  total) by mouth at bedtime. 180 tablet 0  . losartan (COZAAR) 50 MG tablet Take 1 tablet (50 mg total) by mouth daily. 90 tablet 0  . omeprazole (PRILOSEC) 20 MG capsule Take 1 capsule (20 mg total) by mouth daily. 90 capsule 0  . QUEtiapine (SEROQUEL XR) 400 MG 24 hr tablet Take 1 tablet (400 mg total) by mouth at bedtime. 90 tablet 0  . simvastatin (ZOCOR) 40 MG tablet TAKE 1 TABLET BY MOUTH EVERY EVENING 30 tablet 0   No current facility-administered medications on file prior to visit.    BP 138/80 mmHg  Pulse 97  Temp(Src) 98.3 F (36.8 C) (Oral)  Resp 18  Ht 5\' 9"  (1.753 m)  Wt 186 lb 9.6 oz (84.641 kg)  BMI 27.54 kg/m2  SpO2 93%  LMP 09/23/1988       Objective:   Physical Exam  Constitutional: She is oriented to person, place, and time. She appears well-developed and well-nourished.  Cardiovascular: Normal rate, regular rhythm and normal heart sounds.   No  murmur heard. Pulmonary/Chest: Effort normal and breath sounds normal. No respiratory distress.  Diminished breath sounds throughout  Musculoskeletal: She exhibits no edema.  Neurological: She is alert and oriented to person, place, and time.  Skin: Skin is warm and dry.  Psychiatric: She has a normal mood and affect. Her behavior is normal. Judgment and thought content normal.          Assessment & Plan:

## 2016-03-01 NOTE — Assessment & Plan Note (Signed)
Uncontrolled, will refer back to pulmonology.

## 2016-03-01 NOTE — Assessment & Plan Note (Signed)
Tolerating statin, obtain lipid panel.  

## 2016-03-01 NOTE — Assessment & Plan Note (Signed)
Clinically stable, continue synthroid, obtain tsh.

## 2016-03-01 NOTE — Progress Notes (Signed)
Pre visit review using our clinic review tool, if applicable. No additional management support is needed unless otherwise documented below in the visit note. 

## 2016-03-02 DIAGNOSIS — R42 Dizziness and giddiness: Secondary | ICD-10-CM | POA: Diagnosis not present

## 2016-03-02 DIAGNOSIS — T380X5A Adverse effect of glucocorticoids and synthetic analogues, initial encounter: Secondary | ICD-10-CM | POA: Diagnosis not present

## 2016-03-02 DIAGNOSIS — R0600 Dyspnea, unspecified: Secondary | ICD-10-CM | POA: Diagnosis not present

## 2016-03-02 DIAGNOSIS — Z87898 Personal history of other specified conditions: Secondary | ICD-10-CM | POA: Diagnosis not present

## 2016-03-02 DIAGNOSIS — R251 Tremor, unspecified: Secondary | ICD-10-CM | POA: Diagnosis not present

## 2016-03-02 DIAGNOSIS — J9622 Acute and chronic respiratory failure with hypercapnia: Secondary | ICD-10-CM | POA: Diagnosis not present

## 2016-03-02 DIAGNOSIS — J9621 Acute and chronic respiratory failure with hypoxia: Secondary | ICD-10-CM | POA: Diagnosis not present

## 2016-03-02 DIAGNOSIS — R4182 Altered mental status, unspecified: Secondary | ICD-10-CM | POA: Diagnosis not present

## 2016-03-02 DIAGNOSIS — K219 Gastro-esophageal reflux disease without esophagitis: Secondary | ICD-10-CM | POA: Diagnosis not present

## 2016-03-02 DIAGNOSIS — R410 Disorientation, unspecified: Secondary | ICD-10-CM | POA: Diagnosis not present

## 2016-03-02 DIAGNOSIS — R5601 Complex febrile convulsions: Secondary | ICD-10-CM | POA: Diagnosis not present

## 2016-03-02 DIAGNOSIS — R278 Other lack of coordination: Secondary | ICD-10-CM | POA: Diagnosis not present

## 2016-03-02 DIAGNOSIS — R0902 Hypoxemia: Secondary | ICD-10-CM | POA: Diagnosis not present

## 2016-03-02 DIAGNOSIS — I10 Essential (primary) hypertension: Secondary | ICD-10-CM | POA: Diagnosis not present

## 2016-03-02 DIAGNOSIS — F17218 Nicotine dependence, cigarettes, with other nicotine-induced disorders: Secondary | ICD-10-CM | POA: Diagnosis not present

## 2016-03-02 DIAGNOSIS — E039 Hypothyroidism, unspecified: Secondary | ICD-10-CM | POA: Diagnosis not present

## 2016-03-02 DIAGNOSIS — Z85841 Personal history of malignant neoplasm of brain: Secondary | ICD-10-CM | POA: Diagnosis not present

## 2016-03-02 DIAGNOSIS — G40201 Localization-related (focal) (partial) symptomatic epilepsy and epileptic syndromes with complex partial seizures, not intractable, with status epilepticus: Secondary | ICD-10-CM | POA: Diagnosis not present

## 2016-03-02 DIAGNOSIS — E785 Hyperlipidemia, unspecified: Secondary | ICD-10-CM | POA: Diagnosis not present

## 2016-03-02 DIAGNOSIS — R739 Hyperglycemia, unspecified: Secondary | ICD-10-CM | POA: Diagnosis not present

## 2016-03-02 DIAGNOSIS — R55 Syncope and collapse: Secondary | ICD-10-CM | POA: Diagnosis not present

## 2016-03-02 DIAGNOSIS — R569 Unspecified convulsions: Secondary | ICD-10-CM | POA: Diagnosis not present

## 2016-03-02 DIAGNOSIS — J441 Chronic obstructive pulmonary disease with (acute) exacerbation: Secondary | ICD-10-CM | POA: Diagnosis not present

## 2016-03-02 DIAGNOSIS — F17210 Nicotine dependence, cigarettes, uncomplicated: Secondary | ICD-10-CM | POA: Diagnosis not present

## 2016-03-03 ENCOUNTER — Other Ambulatory Visit: Payer: Self-pay | Admitting: Family

## 2016-03-03 NOTE — Telephone Encounter (Signed)
Your triglycerides are mildly elevated. Please work on avoiding concentrated sweets, and limiting white carbs (rice/bread/pasta/potatoes). Instead substitute whole grain versions with reasonable portions.  Also, d/c simvastatin, start atorvastatin, add fenofibrate. Repeat flp in 6 weeks. Thyroid testing looks good.

## 2016-03-04 NOTE — Telephone Encounter (Signed)
Left message with son, Dietrich Pates to have pt return my call.

## 2016-03-06 DIAGNOSIS — R0902 Hypoxemia: Secondary | ICD-10-CM | POA: Diagnosis not present

## 2016-03-06 DIAGNOSIS — J449 Chronic obstructive pulmonary disease, unspecified: Secondary | ICD-10-CM | POA: Diagnosis not present

## 2016-03-07 ENCOUNTER — Telehealth: Payer: Self-pay | Admitting: Behavioral Health

## 2016-03-07 DIAGNOSIS — J441 Chronic obstructive pulmonary disease with (acute) exacerbation: Secondary | ICD-10-CM | POA: Diagnosis not present

## 2016-03-07 DIAGNOSIS — Z86011 Personal history of benign neoplasm of the brain: Secondary | ICD-10-CM | POA: Diagnosis not present

## 2016-03-07 DIAGNOSIS — E039 Hypothyroidism, unspecified: Secondary | ICD-10-CM | POA: Diagnosis not present

## 2016-03-07 DIAGNOSIS — R739 Hyperglycemia, unspecified: Secondary | ICD-10-CM | POA: Diagnosis not present

## 2016-03-07 DIAGNOSIS — Z9981 Dependence on supplemental oxygen: Secondary | ICD-10-CM | POA: Diagnosis not present

## 2016-03-07 DIAGNOSIS — K219 Gastro-esophageal reflux disease without esophagitis: Secondary | ICD-10-CM | POA: Diagnosis not present

## 2016-03-07 DIAGNOSIS — J9612 Chronic respiratory failure with hypercapnia: Secondary | ICD-10-CM | POA: Diagnosis not present

## 2016-03-07 DIAGNOSIS — Z7984 Long term (current) use of oral hypoglycemic drugs: Secondary | ICD-10-CM | POA: Diagnosis not present

## 2016-03-07 DIAGNOSIS — T380X5D Adverse effect of glucocorticoids and synthetic analogues, subsequent encounter: Secondary | ICD-10-CM | POA: Diagnosis not present

## 2016-03-07 DIAGNOSIS — J9611 Chronic respiratory failure with hypoxia: Secondary | ICD-10-CM | POA: Diagnosis not present

## 2016-03-07 DIAGNOSIS — E785 Hyperlipidemia, unspecified: Secondary | ICD-10-CM | POA: Diagnosis not present

## 2016-03-07 NOTE — Telephone Encounter (Signed)
Caller: Jinny Blossom, RN at [facility] [phone]  Reason for call: Verbal order for continued diabetic education  Jinny Blossom, RN voiced that the patient discharged on Tuesday, 6/13 from [facility] after a COPD flare-up; it was during this admission that she was given a newly diagnosis of diabetes, which may be prednisone induced (currently on a prednisone taper) or just lifestyle in general. Patient is now taking Metformin daily and blood sugar earlier was 335. Per the RN, the patient will have blood sugar rechecked this evening before her meal and if it's over 400, the home health agency has a protocol in place and an after-hours nurse will be available to assist with triage. Diabetic teaching was provided to the patient and RN assessed that reinforcement is still needed at this time. She requested verbal order to continue diabetic education with the patient, two days a week for the next 2 weeks and then once weekly for a month.  Mackie Pai, PA-C was made aware of the RN's request and verbal order was given. RN did not have any further questions or concerns prior to the end of call.  Patient has been scheduled for a hospital follow-up with PCP on 03/11/16 at 2:30 PM.

## 2016-03-09 DIAGNOSIS — Z86011 Personal history of benign neoplasm of the brain: Secondary | ICD-10-CM | POA: Diagnosis not present

## 2016-03-09 DIAGNOSIS — K219 Gastro-esophageal reflux disease without esophagitis: Secondary | ICD-10-CM | POA: Diagnosis not present

## 2016-03-09 DIAGNOSIS — E785 Hyperlipidemia, unspecified: Secondary | ICD-10-CM | POA: Diagnosis not present

## 2016-03-09 DIAGNOSIS — Z7984 Long term (current) use of oral hypoglycemic drugs: Secondary | ICD-10-CM | POA: Diagnosis not present

## 2016-03-09 DIAGNOSIS — R739 Hyperglycemia, unspecified: Secondary | ICD-10-CM | POA: Diagnosis not present

## 2016-03-09 DIAGNOSIS — Z9981 Dependence on supplemental oxygen: Secondary | ICD-10-CM | POA: Diagnosis not present

## 2016-03-09 DIAGNOSIS — J9611 Chronic respiratory failure with hypoxia: Secondary | ICD-10-CM | POA: Diagnosis not present

## 2016-03-09 DIAGNOSIS — E039 Hypothyroidism, unspecified: Secondary | ICD-10-CM | POA: Diagnosis not present

## 2016-03-09 DIAGNOSIS — T380X5D Adverse effect of glucocorticoids and synthetic analogues, subsequent encounter: Secondary | ICD-10-CM | POA: Diagnosis not present

## 2016-03-09 DIAGNOSIS — J441 Chronic obstructive pulmonary disease with (acute) exacerbation: Secondary | ICD-10-CM | POA: Diagnosis not present

## 2016-03-09 DIAGNOSIS — J9612 Chronic respiratory failure with hypercapnia: Secondary | ICD-10-CM | POA: Diagnosis not present

## 2016-03-10 DIAGNOSIS — N289 Disorder of kidney and ureter, unspecified: Secondary | ICD-10-CM | POA: Diagnosis not present

## 2016-03-10 DIAGNOSIS — E1165 Type 2 diabetes mellitus with hyperglycemia: Secondary | ICD-10-CM | POA: Diagnosis not present

## 2016-03-10 DIAGNOSIS — R251 Tremor, unspecified: Secondary | ICD-10-CM | POA: Diagnosis not present

## 2016-03-10 DIAGNOSIS — R569 Unspecified convulsions: Secondary | ICD-10-CM | POA: Diagnosis not present

## 2016-03-10 DIAGNOSIS — R42 Dizziness and giddiness: Secondary | ICD-10-CM | POA: Diagnosis not present

## 2016-03-10 DIAGNOSIS — R4182 Altered mental status, unspecified: Secondary | ICD-10-CM | POA: Diagnosis not present

## 2016-03-10 NOTE — Telephone Encounter (Signed)
Did he ever call back?

## 2016-03-11 ENCOUNTER — Ambulatory Visit: Payer: Medicare Other | Admitting: Family

## 2016-03-11 MED ORDER — ATORVASTATIN CALCIUM 40 MG PO TABS: 40.0000 mg | ORAL_TABLET | Freq: Every day | ORAL | Status: DC

## 2016-03-11 MED ORDER — FENOFIBRATE 145 MG PO TABS: 145.0000 mg | ORAL_TABLET | Freq: Every day | ORAL | Status: DC

## 2016-03-11 NOTE — Telephone Encounter (Signed)
Pt was scheduled for hospital  Follow up with Melissa today at 2:30pm. Please call pt to reschedule appointment. Melissa has no openings this week. Per verbal from PCP, ok to schedule with another Provider in the office. Thanks!  Attempted to reach pt and received message that wireless customer is not available. Mailed letter to pt.

## 2016-03-12 ENCOUNTER — Telehealth: Payer: Self-pay | Admitting: Family

## 2016-03-12 DIAGNOSIS — K219 Gastro-esophageal reflux disease without esophagitis: Secondary | ICD-10-CM | POA: Diagnosis not present

## 2016-03-12 DIAGNOSIS — Z86011 Personal history of benign neoplasm of the brain: Secondary | ICD-10-CM | POA: Diagnosis not present

## 2016-03-12 DIAGNOSIS — E785 Hyperlipidemia, unspecified: Secondary | ICD-10-CM | POA: Diagnosis not present

## 2016-03-12 DIAGNOSIS — R739 Hyperglycemia, unspecified: Secondary | ICD-10-CM | POA: Diagnosis not present

## 2016-03-12 DIAGNOSIS — T380X5D Adverse effect of glucocorticoids and synthetic analogues, subsequent encounter: Secondary | ICD-10-CM | POA: Diagnosis not present

## 2016-03-12 DIAGNOSIS — Z7984 Long term (current) use of oral hypoglycemic drugs: Secondary | ICD-10-CM | POA: Diagnosis not present

## 2016-03-12 DIAGNOSIS — J441 Chronic obstructive pulmonary disease with (acute) exacerbation: Secondary | ICD-10-CM | POA: Diagnosis not present

## 2016-03-12 DIAGNOSIS — J9612 Chronic respiratory failure with hypercapnia: Secondary | ICD-10-CM | POA: Diagnosis not present

## 2016-03-12 DIAGNOSIS — Z9981 Dependence on supplemental oxygen: Secondary | ICD-10-CM | POA: Diagnosis not present

## 2016-03-12 DIAGNOSIS — E039 Hypothyroidism, unspecified: Secondary | ICD-10-CM | POA: Diagnosis not present

## 2016-03-12 DIAGNOSIS — J9611 Chronic respiratory failure with hypoxia: Secondary | ICD-10-CM | POA: Diagnosis not present

## 2016-03-12 DIAGNOSIS — J449 Chronic obstructive pulmonary disease, unspecified: Secondary | ICD-10-CM | POA: Diagnosis not present

## 2016-03-12 NOTE — Telephone Encounter (Signed)
Patient was a No Show 6/19. Previous No Show's 02/14/2016, 08/06/2014  Charge or No Charge?

## 2016-03-12 NOTE — Telephone Encounter (Signed)
Yes please

## 2016-03-13 ENCOUNTER — Encounter: Payer: Self-pay | Admitting: Family

## 2016-03-14 ENCOUNTER — Ambulatory Visit: Payer: Medicare Other | Admitting: Family

## 2016-03-14 ENCOUNTER — Telehealth: Payer: Self-pay | Admitting: Family

## 2016-03-14 DIAGNOSIS — J939 Pneumothorax, unspecified: Secondary | ICD-10-CM | POA: Diagnosis not present

## 2016-03-14 DIAGNOSIS — Z45018 Encounter for adjustment and management of other part of cardiac pacemaker: Secondary | ICD-10-CM | POA: Diagnosis not present

## 2016-03-14 DIAGNOSIS — E1165 Type 2 diabetes mellitus with hyperglycemia: Secondary | ICD-10-CM | POA: Diagnosis not present

## 2016-03-14 DIAGNOSIS — R55 Syncope and collapse: Secondary | ICD-10-CM | POA: Diagnosis not present

## 2016-03-14 DIAGNOSIS — N289 Disorder of kidney and ureter, unspecified: Secondary | ICD-10-CM | POA: Diagnosis not present

## 2016-03-14 DIAGNOSIS — E785 Hyperlipidemia, unspecified: Secondary | ICD-10-CM | POA: Diagnosis not present

## 2016-03-14 DIAGNOSIS — E039 Hypothyroidism, unspecified: Secondary | ICD-10-CM | POA: Diagnosis not present

## 2016-03-14 DIAGNOSIS — I952 Hypotension due to drugs: Secondary | ICD-10-CM | POA: Diagnosis not present

## 2016-03-14 DIAGNOSIS — K219 Gastro-esophageal reflux disease without esophagitis: Secondary | ICD-10-CM | POA: Diagnosis not present

## 2016-03-14 DIAGNOSIS — I517 Cardiomegaly: Secondary | ICD-10-CM | POA: Diagnosis not present

## 2016-03-14 DIAGNOSIS — Z79899 Other long term (current) drug therapy: Secondary | ICD-10-CM | POA: Diagnosis not present

## 2016-03-14 DIAGNOSIS — I4510 Unspecified right bundle-branch block: Secondary | ICD-10-CM | POA: Diagnosis not present

## 2016-03-14 DIAGNOSIS — I10 Essential (primary) hypertension: Secondary | ICD-10-CM | POA: Diagnosis not present

## 2016-03-14 DIAGNOSIS — T4275XA Adverse effect of unspecified antiepileptic and sedative-hypnotic drugs, initial encounter: Secondary | ICD-10-CM | POA: Diagnosis not present

## 2016-03-14 DIAGNOSIS — E784 Other hyperlipidemia: Secondary | ICD-10-CM | POA: Diagnosis not present

## 2016-03-14 DIAGNOSIS — Z7984 Long term (current) use of oral hypoglycemic drugs: Secondary | ICD-10-CM | POA: Diagnosis not present

## 2016-03-14 DIAGNOSIS — Z87891 Personal history of nicotine dependence: Secondary | ICD-10-CM | POA: Diagnosis not present

## 2016-03-14 DIAGNOSIS — I442 Atrioventricular block, complete: Secondary | ICD-10-CM | POA: Diagnosis not present

## 2016-03-14 DIAGNOSIS — R402431 Glasgow coma scale score 3-8, in the field [EMT or ambulance]: Secondary | ICD-10-CM | POA: Diagnosis not present

## 2016-03-14 DIAGNOSIS — R251 Tremor, unspecified: Secondary | ICD-10-CM | POA: Diagnosis not present

## 2016-03-14 DIAGNOSIS — I119 Hypertensive heart disease without heart failure: Secondary | ICD-10-CM | POA: Diagnosis not present

## 2016-03-14 DIAGNOSIS — I959 Hypotension, unspecified: Secondary | ICD-10-CM | POA: Diagnosis not present

## 2016-03-14 DIAGNOSIS — J449 Chronic obstructive pulmonary disease, unspecified: Secondary | ICD-10-CM | POA: Diagnosis not present

## 2016-03-14 DIAGNOSIS — S60511A Abrasion of right hand, initial encounter: Secondary | ICD-10-CM | POA: Diagnosis not present

## 2016-03-14 NOTE — Telephone Encounter (Signed)
FYI:  Patient was scheduled to come in for appt this morning but is at [facility] in surgery having a [employer]  put in. Cancelled appt

## 2016-03-14 NOTE — Telephone Encounter (Signed)
Noted  

## 2016-03-18 ENCOUNTER — Telehealth: Payer: Self-pay | Admitting: Family

## 2016-03-18 DIAGNOSIS — J9612 Chronic respiratory failure with hypercapnia: Secondary | ICD-10-CM | POA: Diagnosis not present

## 2016-03-18 DIAGNOSIS — Z9981 Dependence on supplemental oxygen: Secondary | ICD-10-CM | POA: Diagnosis not present

## 2016-03-18 DIAGNOSIS — J9611 Chronic respiratory failure with hypoxia: Secondary | ICD-10-CM | POA: Diagnosis not present

## 2016-03-18 DIAGNOSIS — Z86011 Personal history of benign neoplasm of the brain: Secondary | ICD-10-CM | POA: Diagnosis not present

## 2016-03-18 DIAGNOSIS — E039 Hypothyroidism, unspecified: Secondary | ICD-10-CM | POA: Diagnosis not present

## 2016-03-18 DIAGNOSIS — R739 Hyperglycemia, unspecified: Secondary | ICD-10-CM | POA: Diagnosis not present

## 2016-03-18 DIAGNOSIS — T380X5D Adverse effect of glucocorticoids and synthetic analogues, subsequent encounter: Secondary | ICD-10-CM | POA: Diagnosis not present

## 2016-03-18 DIAGNOSIS — E785 Hyperlipidemia, unspecified: Secondary | ICD-10-CM | POA: Diagnosis not present

## 2016-03-18 DIAGNOSIS — K219 Gastro-esophageal reflux disease without esophagitis: Secondary | ICD-10-CM | POA: Diagnosis not present

## 2016-03-18 DIAGNOSIS — J441 Chronic obstructive pulmonary disease with (acute) exacerbation: Secondary | ICD-10-CM | POA: Diagnosis not present

## 2016-03-18 DIAGNOSIS — Z7984 Long term (current) use of oral hypoglycemic drugs: Secondary | ICD-10-CM | POA: Diagnosis not present

## 2016-03-18 NOTE — Telephone Encounter (Signed)
Ok to resume pre-admission orders.

## 2016-03-18 NOTE — Telephone Encounter (Signed)
Left detailed message on below voicemail and to call if any further questions.

## 2016-03-18 NOTE — Telephone Encounter (Signed)
Caller name: Benjamine Mola RN with [facility]  Can be reached: [phone]  Reason for call: Pt was in hospital 22-24th for 3rd degree heart block. Pt had pacemaker placed. They need orders to resume care and additional orders. Please call.

## 2016-03-21 DIAGNOSIS — T380X5D Adverse effect of glucocorticoids and synthetic analogues, subsequent encounter: Secondary | ICD-10-CM | POA: Diagnosis not present

## 2016-03-21 DIAGNOSIS — E785 Hyperlipidemia, unspecified: Secondary | ICD-10-CM | POA: Diagnosis not present

## 2016-03-21 DIAGNOSIS — J9612 Chronic respiratory failure with hypercapnia: Secondary | ICD-10-CM | POA: Diagnosis not present

## 2016-03-21 DIAGNOSIS — Z7984 Long term (current) use of oral hypoglycemic drugs: Secondary | ICD-10-CM | POA: Diagnosis not present

## 2016-03-21 DIAGNOSIS — E039 Hypothyroidism, unspecified: Secondary | ICD-10-CM | POA: Diagnosis not present

## 2016-03-21 DIAGNOSIS — K219 Gastro-esophageal reflux disease without esophagitis: Secondary | ICD-10-CM | POA: Diagnosis not present

## 2016-03-21 DIAGNOSIS — Z86011 Personal history of benign neoplasm of the brain: Secondary | ICD-10-CM | POA: Diagnosis not present

## 2016-03-21 DIAGNOSIS — Z9981 Dependence on supplemental oxygen: Secondary | ICD-10-CM | POA: Diagnosis not present

## 2016-03-21 DIAGNOSIS — J441 Chronic obstructive pulmonary disease with (acute) exacerbation: Secondary | ICD-10-CM | POA: Diagnosis not present

## 2016-03-21 DIAGNOSIS — J9611 Chronic respiratory failure with hypoxia: Secondary | ICD-10-CM | POA: Diagnosis not present

## 2016-03-21 DIAGNOSIS — R739 Hyperglycemia, unspecified: Secondary | ICD-10-CM | POA: Diagnosis not present

## 2016-03-25 ENCOUNTER — Telehealth: Payer: Self-pay | Admitting: Family

## 2016-03-25 NOTE — Telephone Encounter (Signed)
Attempted to reach [place] and received recording for the answering service. Will call back during regular business hours.

## 2016-03-25 NOTE — Telephone Encounter (Signed)
Stacy with [facility] called in to get verbal orders to work with pt. She says that pt was just released from the hospital.     CB: [phone]

## 2016-03-27 ENCOUNTER — Telehealth: Payer: Self-pay | Admitting: Family

## 2016-03-27 DIAGNOSIS — T380X5D Adverse effect of glucocorticoids and synthetic analogues, subsequent encounter: Secondary | ICD-10-CM | POA: Diagnosis not present

## 2016-03-27 DIAGNOSIS — Z7984 Long term (current) use of oral hypoglycemic drugs: Secondary | ICD-10-CM | POA: Diagnosis not present

## 2016-03-27 DIAGNOSIS — E785 Hyperlipidemia, unspecified: Secondary | ICD-10-CM | POA: Diagnosis not present

## 2016-03-27 DIAGNOSIS — K219 Gastro-esophageal reflux disease without esophagitis: Secondary | ICD-10-CM | POA: Diagnosis not present

## 2016-03-27 DIAGNOSIS — J9611 Chronic respiratory failure with hypoxia: Secondary | ICD-10-CM | POA: Diagnosis not present

## 2016-03-27 DIAGNOSIS — J441 Chronic obstructive pulmonary disease with (acute) exacerbation: Secondary | ICD-10-CM | POA: Diagnosis not present

## 2016-03-27 DIAGNOSIS — Z9981 Dependence on supplemental oxygen: Secondary | ICD-10-CM | POA: Diagnosis not present

## 2016-03-27 DIAGNOSIS — E039 Hypothyroidism, unspecified: Secondary | ICD-10-CM | POA: Diagnosis not present

## 2016-03-27 DIAGNOSIS — J9612 Chronic respiratory failure with hypercapnia: Secondary | ICD-10-CM | POA: Diagnosis not present

## 2016-03-27 DIAGNOSIS — R739 Hyperglycemia, unspecified: Secondary | ICD-10-CM | POA: Diagnosis not present

## 2016-03-27 DIAGNOSIS — Z86011 Personal history of benign neoplasm of the brain: Secondary | ICD-10-CM | POA: Diagnosis not present

## 2016-03-27 NOTE — Telephone Encounter (Signed)
Please advise 

## 2016-03-27 NOTE — Telephone Encounter (Signed)
Yes please, lets bring her in sooner.

## 2016-03-27 NOTE — Telephone Encounter (Signed)
Spoke w/ Pt, she declined to schedule earlier appt, as she doesn't have a way to get here. She informed me she hasn't been using her nebulizer like she should, instructed her to start using and to let us know if she becomes worse. Pt verbalized understanding.

## 2016-03-27 NOTE — Telephone Encounter (Signed)
Home Health Nurse called to inform PCP that patient has wheezing and congestion and she was not sure if PCP would want to see the patient before her scheduled appointment on Monday 7/10. Please call Charlene @ [phone].

## 2016-03-27 NOTE — Telephone Encounter (Signed)
Noted, ok

## 2016-03-27 NOTE — Telephone Encounter (Signed)
Notified Kathryn Buckley ok to proceed with evaluation. She states they wanted order to move the home health evaluation back 1 week advised her ok. Please let me know if you have any different recommendation?

## 2016-03-28 ENCOUNTER — Telehealth: Payer: Self-pay | Admitting: Family

## 2016-03-28 DIAGNOSIS — E785 Hyperlipidemia, unspecified: Secondary | ICD-10-CM | POA: Diagnosis not present

## 2016-03-28 DIAGNOSIS — R739 Hyperglycemia, unspecified: Secondary | ICD-10-CM | POA: Diagnosis not present

## 2016-03-28 DIAGNOSIS — Z9981 Dependence on supplemental oxygen: Secondary | ICD-10-CM | POA: Diagnosis not present

## 2016-03-28 DIAGNOSIS — E039 Hypothyroidism, unspecified: Secondary | ICD-10-CM | POA: Diagnosis not present

## 2016-03-28 DIAGNOSIS — J441 Chronic obstructive pulmonary disease with (acute) exacerbation: Secondary | ICD-10-CM | POA: Diagnosis not present

## 2016-03-28 DIAGNOSIS — Z86011 Personal history of benign neoplasm of the brain: Secondary | ICD-10-CM | POA: Diagnosis not present

## 2016-03-28 DIAGNOSIS — Z7984 Long term (current) use of oral hypoglycemic drugs: Secondary | ICD-10-CM | POA: Diagnosis not present

## 2016-03-28 DIAGNOSIS — J9611 Chronic respiratory failure with hypoxia: Secondary | ICD-10-CM | POA: Diagnosis not present

## 2016-03-28 DIAGNOSIS — K219 Gastro-esophageal reflux disease without esophagitis: Secondary | ICD-10-CM | POA: Diagnosis not present

## 2016-03-28 DIAGNOSIS — T380X5D Adverse effect of glucocorticoids and synthetic analogues, subsequent encounter: Secondary | ICD-10-CM | POA: Diagnosis not present

## 2016-03-28 DIAGNOSIS — J9612 Chronic respiratory failure with hypercapnia: Secondary | ICD-10-CM | POA: Diagnosis not present

## 2016-03-28 NOTE — Telephone Encounter (Signed)
Caller name: Lennette Bihari with [facility]  Can be reached: [phone]  Reason for call: eval'd for PT today. Would like VO for 1 week 1, 3 week 1, 2 week 4. Please call with VO.

## 2016-03-28 NOTE — Telephone Encounter (Signed)
Spoke w/ Lennette Bihari, verbal orders given.

## 2016-03-28 NOTE — Telephone Encounter (Signed)
Okay to give verbal order.

## 2016-03-28 NOTE — Telephone Encounter (Signed)
Yes please

## 2016-03-29 DIAGNOSIS — I442 Atrioventricular block, complete: Secondary | ICD-10-CM | POA: Diagnosis not present

## 2016-03-29 DIAGNOSIS — Z45018 Encounter for adjustment and management of other part of cardiac pacemaker: Secondary | ICD-10-CM | POA: Diagnosis not present

## 2016-04-01 ENCOUNTER — Encounter: Payer: Self-pay | Admitting: Family

## 2016-04-01 ENCOUNTER — Ambulatory Visit (INDEPENDENT_AMBULATORY_CARE_PROVIDER_SITE_OTHER): Payer: Medicare Other | Admitting: Family

## 2016-04-01 ENCOUNTER — Telehealth: Payer: Self-pay | Admitting: Family

## 2016-04-01 VITALS — BP 90/60 | HR 91 | Temp 98.3°F | Ht 69.0 in | Wt 187.4 lb

## 2016-04-01 DIAGNOSIS — Z720 Tobacco use: Secondary | ICD-10-CM

## 2016-04-01 DIAGNOSIS — G40909 Epilepsy, unspecified, not intractable, without status epilepticus: Secondary | ICD-10-CM

## 2016-04-01 DIAGNOSIS — J418 Mixed simple and mucopurulent chronic bronchitis: Secondary | ICD-10-CM | POA: Diagnosis not present

## 2016-04-01 DIAGNOSIS — Z8679 Personal history of other diseases of the circulatory system: Secondary | ICD-10-CM

## 2016-04-01 MED ORDER — ALPRAZOLAM 0.25 MG PO TABS: 0.2500 mg | ORAL_TABLET | Freq: Two times a day (BID) | ORAL | Status: DC | PRN

## 2016-04-01 NOTE — Telephone Encounter (Signed)
At check out pt mentioned a no show charge for DOS 03/14/16, pt says that she had to go to the hospital so she was unable to make her appt that visit.   Pt received a no show fee.    Could charge be reversed per pt's request?

## 2016-04-01 NOTE — Progress Notes (Signed)
Pre visit review using our clinic review tool, if applicable. No additional management support is needed unless otherwise documented below in the visit note. 

## 2016-04-01 NOTE — Patient Instructions (Addendum)
You may use xanax as needed for panic attacks. You will be contacted about your referral to the neurologist. Keep up the good work with quitting alcohol and cigarettes. Continue the 21mg  nicotine patch for 6 weeks total, then drop down to 14mg  patch for 2 weeks, thn 7mg  for 2 weeks then stop.

## 2016-04-01 NOTE — Progress Notes (Signed)
Subjective:    Patient ID: Kathryn Buckley, female    DOB: [birth year], 65 y.o.   MRN: [id number]  HPI  Kathryn Buckley is a 65 yr old female who presents today for hospital follow up. Hospital record is reviewed in care everywhere. Patient was hospitalized 6/22-24 for 3rd degree heart block.  She had PPM placed. Discharge summary is reviewed.  Patient was admitted following a syncopal event with complete heart block.  She had a PPM placed.  Apparently, her heart rate was 20 on arrival with EMS.    She was also admitted 6/10-6/13/17 with acute on chronic respiratory failure.  There was question of seizure like episode prompting her admission. She saw Dr. Alba Destine (neurology) and he recommended keppra 500mg  bid.  She had an MRI of the brain which did nt show stroke or tumor recurrence.    She did have an ER visit on 6/18-6/19 due to dizziness, which was accompanied by witnessed seizure like activity.  She was incontinent of urine during the episode.    Last drink was 03/02/16. Reports drinking 6 beers a night previously. Denies LOC of dizziness since she has returned home.   Review of Systems    see HPI  Past Medical History  Diagnosis Date  . Depression   . Thyroid disease   . HTN (hypertension)   . Hyperlipemia   . Seizures ([place])     Due to brain tumor that was removed in 2004  . COPD (chronic obstructive pulmonary disease) ([place])      Social History   Social History  . Marital Status: Married    Spouse Name: N/A  . Number of Children: N/A  . Years of Education: N/A   Occupational History  . Not on file.   Social History Main Topics  . Smoking status: Former Smoker -- 2.50 packs/day for 40 years    Types: Cigarettes    Quit date: 03/14/2016  . Smokeless tobacco: Never Used  . Alcohol Use: No  . Drug Use: No  . Sexual Activity: Not on file   Other Topics Concern  . Not on file   Social History Narrative   Lives with husband and 2 chihuahua   She has 2 children-  one son died of drug overdose   1 living son- Kathryn Buckley- lives in [place].     2 step sons   She has worked in the past in Research officer, trade union)   She enjoys TV- likes to be home.             Past Surgical History  Procedure Laterality Date  . Brain surgery      Family History  Problem Relation Age of Onset  . Bipolar disorder Father   . Heart disease Father   . Cancer Father     prostate  . Heart disease Mother   . Hypertension Brother   . Diabetes Brother   . Stroke Neg Hx   . Hyperlipidemia Neg Hx     Allergies  Allergen Reactions  . Lamictal [Lamotrigine] Rash  . Codeine     Other reaction(s): GI Upset (intolerance)    Current Outpatient Prescriptions on File Prior to Visit  Medication Sig Dispense Refill  . atorvastatin (LIPITOR) 40 MG tablet Take 1 tablet (40 mg total) by mouth daily. 90 tablet 0  . fenofibrate (TRICOR) 145 MG tablet Take 1 tablet (145 mg total) by mouth daily. 90 tablet 0  . FLUoxetine (PROZAC) 20 MG capsule Take  1 capsule (20 mg total) by mouth daily. 90 capsule 0  . levothyroxine (SYNTHROID, LEVOTHROID) 150 MCG tablet TAKE 1 TABLET BY MOUTH EVERY DAY 90 tablet 1  . lithium carbonate (LITHOBID) 300 MG CR tablet Take 2 tablets (600 mg total) by mouth at bedtime. 180 tablet 0  . losartan (COZAAR) 50 MG tablet Take 1 tablet (50 mg total) by mouth daily. 90 tablet 0  . omeprazole (PRILOSEC) 20 MG capsule Take 1 capsule (20 mg total) by mouth daily. 90 capsule 0  . QUEtiapine (SEROQUEL XR) 400 MG 24 hr tablet Take 1 tablet (400 mg total) by mouth at bedtime. 90 tablet 0   No current facility-administered medications on file prior to visit.    Pulse 91  Temp(Src) 98.3 F (36.8 C) (Oral)  Ht 5\' 9"  (1.753 m)  Wt 187 lb 6.4 oz (85.004 kg)  BMI 27.66 kg/m2  SpO2 96%  LMP 09/23/1988    Objective:   Physical Exam  Constitutional: She is oriented to person, place, and time. She appears well-developed and well-nourished.  HENT:    Head: Normocephalic and atraumatic.  Cardiovascular: Normal rate, regular rhythm and normal heart sounds.   No murmur heard. Pulmonary/Chest: Effort normal. No respiratory distress. She has no wheezes.  Diminished breath sounds throughout  Neurological: She is alert and oriented to person, place, and time.  Psychiatric: She has a normal mood and affect. Her behavior is normal. Judgment and thought content normal.          Assessment & Plan:

## 2016-04-01 NOTE — Assessment & Plan Note (Signed)
It is not clear if her witnessed "seizure"  Activity was actually due to bradycardia or possibly ETOH withdrawal seizure. She wants to come off of keppra. I have advised her to follow back up with neurology for discussion on discontinuation of keppra.

## 2016-04-01 NOTE — Assessment & Plan Note (Signed)
Hopefully her copd symptoms will improve some now that she is no longer smoking.

## 2016-04-01 NOTE — Assessment & Plan Note (Signed)
Patient quit smoking and I commended her on this. We discussed the nicotine patch taper.

## 2016-04-01 NOTE — Assessment & Plan Note (Signed)
S/p PPM placement °

## 2016-04-02 DIAGNOSIS — T380X5D Adverse effect of glucocorticoids and synthetic analogues, subsequent encounter: Secondary | ICD-10-CM | POA: Diagnosis not present

## 2016-04-02 DIAGNOSIS — J9612 Chronic respiratory failure with hypercapnia: Secondary | ICD-10-CM | POA: Diagnosis not present

## 2016-04-02 DIAGNOSIS — J441 Chronic obstructive pulmonary disease with (acute) exacerbation: Secondary | ICD-10-CM | POA: Diagnosis not present

## 2016-04-02 DIAGNOSIS — Z9981 Dependence on supplemental oxygen: Secondary | ICD-10-CM | POA: Diagnosis not present

## 2016-04-02 DIAGNOSIS — E785 Hyperlipidemia, unspecified: Secondary | ICD-10-CM | POA: Diagnosis not present

## 2016-04-02 DIAGNOSIS — K219 Gastro-esophageal reflux disease without esophagitis: Secondary | ICD-10-CM | POA: Diagnosis not present

## 2016-04-02 DIAGNOSIS — J9611 Chronic respiratory failure with hypoxia: Secondary | ICD-10-CM | POA: Diagnosis not present

## 2016-04-02 DIAGNOSIS — Z86011 Personal history of benign neoplasm of the brain: Secondary | ICD-10-CM | POA: Diagnosis not present

## 2016-04-02 DIAGNOSIS — R739 Hyperglycemia, unspecified: Secondary | ICD-10-CM | POA: Diagnosis not present

## 2016-04-02 DIAGNOSIS — Z7984 Long term (current) use of oral hypoglycemic drugs: Secondary | ICD-10-CM | POA: Diagnosis not present

## 2016-04-02 DIAGNOSIS — E039 Hypothyroidism, unspecified: Secondary | ICD-10-CM | POA: Diagnosis not present

## 2016-04-02 NOTE — Telephone Encounter (Signed)
Please waive no-show fee. 

## 2016-04-04 DIAGNOSIS — Z9981 Dependence on supplemental oxygen: Secondary | ICD-10-CM | POA: Diagnosis not present

## 2016-04-04 DIAGNOSIS — R739 Hyperglycemia, unspecified: Secondary | ICD-10-CM | POA: Diagnosis not present

## 2016-04-04 DIAGNOSIS — J9611 Chronic respiratory failure with hypoxia: Secondary | ICD-10-CM | POA: Diagnosis not present

## 2016-04-04 DIAGNOSIS — K219 Gastro-esophageal reflux disease without esophagitis: Secondary | ICD-10-CM | POA: Diagnosis not present

## 2016-04-04 DIAGNOSIS — E785 Hyperlipidemia, unspecified: Secondary | ICD-10-CM | POA: Diagnosis not present

## 2016-04-04 DIAGNOSIS — Z7984 Long term (current) use of oral hypoglycemic drugs: Secondary | ICD-10-CM | POA: Diagnosis not present

## 2016-04-04 DIAGNOSIS — J9612 Chronic respiratory failure with hypercapnia: Secondary | ICD-10-CM | POA: Diagnosis not present

## 2016-04-04 DIAGNOSIS — T380X5D Adverse effect of glucocorticoids and synthetic analogues, subsequent encounter: Secondary | ICD-10-CM | POA: Diagnosis not present

## 2016-04-04 DIAGNOSIS — J441 Chronic obstructive pulmonary disease with (acute) exacerbation: Secondary | ICD-10-CM | POA: Diagnosis not present

## 2016-04-04 DIAGNOSIS — E039 Hypothyroidism, unspecified: Secondary | ICD-10-CM | POA: Diagnosis not present

## 2016-04-04 DIAGNOSIS — Z86011 Personal history of benign neoplasm of the brain: Secondary | ICD-10-CM | POA: Diagnosis not present

## 2016-04-04 NOTE — Telephone Encounter (Signed)
Please wave no show fee for 03/14/2016/per Debbrah Alar

## 2016-04-05 ENCOUNTER — Telehealth: Payer: Self-pay | Admitting: Family

## 2016-04-05 DIAGNOSIS — Z9981 Dependence on supplemental oxygen: Secondary | ICD-10-CM | POA: Diagnosis not present

## 2016-04-05 DIAGNOSIS — J441 Chronic obstructive pulmonary disease with (acute) exacerbation: Secondary | ICD-10-CM | POA: Diagnosis not present

## 2016-04-05 DIAGNOSIS — Z86011 Personal history of benign neoplasm of the brain: Secondary | ICD-10-CM | POA: Diagnosis not present

## 2016-04-05 DIAGNOSIS — E785 Hyperlipidemia, unspecified: Secondary | ICD-10-CM | POA: Diagnosis not present

## 2016-04-05 DIAGNOSIS — J9611 Chronic respiratory failure with hypoxia: Secondary | ICD-10-CM | POA: Diagnosis not present

## 2016-04-05 DIAGNOSIS — K219 Gastro-esophageal reflux disease without esophagitis: Secondary | ICD-10-CM | POA: Diagnosis not present

## 2016-04-05 DIAGNOSIS — Z7984 Long term (current) use of oral hypoglycemic drugs: Secondary | ICD-10-CM | POA: Diagnosis not present

## 2016-04-05 DIAGNOSIS — J9612 Chronic respiratory failure with hypercapnia: Secondary | ICD-10-CM | POA: Diagnosis not present

## 2016-04-05 DIAGNOSIS — T380X5D Adverse effect of glucocorticoids and synthetic analogues, subsequent encounter: Secondary | ICD-10-CM | POA: Diagnosis not present

## 2016-04-05 DIAGNOSIS — R739 Hyperglycemia, unspecified: Secondary | ICD-10-CM | POA: Diagnosis not present

## 2016-04-05 DIAGNOSIS — E039 Hypothyroidism, unspecified: Secondary | ICD-10-CM | POA: Diagnosis not present

## 2016-04-05 MED ORDER — METFORMIN HCL 500 MG PO TABS: 500.0000 mg | ORAL_TABLET | Freq: Two times a day (BID) | ORAL | Status: DC

## 2016-04-05 NOTE — Telephone Encounter (Signed)
Caller name: Randell Patient  Relation to pt: Home Health Nurse from [employer] back number: [phone] Pharmacy: [facility], [place] - [address] AT [place] [phone] (Phone) [phone] (Fax)         Reason for call:  Nurse is currently with patient and states patient has only 2 pills left of metFORMIN (GLUCOPHAGE) 500 MG tablet requesting a refill please advise.

## 2016-04-05 NOTE — Telephone Encounter (Signed)
BID dosing verified w/ pt and pt notified that rx will be filled. Medication filled to pharmacy as requested. Routed to PCP for FYI.

## 2016-04-05 NOTE — Telephone Encounter (Signed)
Medication was added to medication list on 04/01/16 by [place] working with Lenna Sciara. So it seems she has been on medication. I will allow 30-day supply of medication in PCP absence. Please verify that patient has been taking twice daily before sending. Make PCP aware of this so they can follow-up with patient for further fills and assessment.

## 2016-04-05 NOTE — Telephone Encounter (Signed)
Please advise, PCP has not filled this medication before. Reviewed Care Everywhere, and it looks like this medication may have been started on hospital discharge from [facility] 03/05/16.

## 2016-04-07 ENCOUNTER — Other Ambulatory Visit: Payer: Self-pay | Admitting: Family

## 2016-04-08 DIAGNOSIS — J9612 Chronic respiratory failure with hypercapnia: Secondary | ICD-10-CM | POA: Diagnosis not present

## 2016-04-08 DIAGNOSIS — E785 Hyperlipidemia, unspecified: Secondary | ICD-10-CM | POA: Diagnosis not present

## 2016-04-08 DIAGNOSIS — E039 Hypothyroidism, unspecified: Secondary | ICD-10-CM | POA: Diagnosis not present

## 2016-04-08 DIAGNOSIS — J9611 Chronic respiratory failure with hypoxia: Secondary | ICD-10-CM | POA: Diagnosis not present

## 2016-04-08 DIAGNOSIS — Z9981 Dependence on supplemental oxygen: Secondary | ICD-10-CM | POA: Diagnosis not present

## 2016-04-08 DIAGNOSIS — Z86011 Personal history of benign neoplasm of the brain: Secondary | ICD-10-CM | POA: Diagnosis not present

## 2016-04-08 DIAGNOSIS — T380X5D Adverse effect of glucocorticoids and synthetic analogues, subsequent encounter: Secondary | ICD-10-CM | POA: Diagnosis not present

## 2016-04-08 DIAGNOSIS — Z7984 Long term (current) use of oral hypoglycemic drugs: Secondary | ICD-10-CM | POA: Diagnosis not present

## 2016-04-08 DIAGNOSIS — J441 Chronic obstructive pulmonary disease with (acute) exacerbation: Secondary | ICD-10-CM | POA: Diagnosis not present

## 2016-04-08 DIAGNOSIS — K219 Gastro-esophageal reflux disease without esophagitis: Secondary | ICD-10-CM | POA: Diagnosis not present

## 2016-04-08 DIAGNOSIS — R739 Hyperglycemia, unspecified: Secondary | ICD-10-CM | POA: Diagnosis not present

## 2016-04-08 MED ORDER — ATORVASTATIN CALCIUM 40 MG PO TABS: 40.0000 mg | ORAL_TABLET | Freq: Every day | ORAL | Status: DC

## 2016-04-08 NOTE — Telephone Encounter (Signed)
Rx sent to the pharmacy by e-script.  Pt made aware that medication has been changed and new prescription sent to [facility] at pt's request.//AB/CMA

## 2016-04-09 DIAGNOSIS — H53461 Homonymous bilateral field defects, right side: Secondary | ICD-10-CM | POA: Diagnosis not present

## 2016-04-09 DIAGNOSIS — Z720 Tobacco use: Secondary | ICD-10-CM | POA: Diagnosis not present

## 2016-04-10 DIAGNOSIS — K219 Gastro-esophageal reflux disease without esophagitis: Secondary | ICD-10-CM | POA: Diagnosis not present

## 2016-04-10 DIAGNOSIS — J441 Chronic obstructive pulmonary disease with (acute) exacerbation: Secondary | ICD-10-CM | POA: Diagnosis not present

## 2016-04-10 DIAGNOSIS — Z9981 Dependence on supplemental oxygen: Secondary | ICD-10-CM | POA: Diagnosis not present

## 2016-04-10 DIAGNOSIS — J9612 Chronic respiratory failure with hypercapnia: Secondary | ICD-10-CM | POA: Diagnosis not present

## 2016-04-10 DIAGNOSIS — Z7984 Long term (current) use of oral hypoglycemic drugs: Secondary | ICD-10-CM | POA: Diagnosis not present

## 2016-04-10 DIAGNOSIS — T380X5D Adverse effect of glucocorticoids and synthetic analogues, subsequent encounter: Secondary | ICD-10-CM | POA: Diagnosis not present

## 2016-04-10 DIAGNOSIS — R739 Hyperglycemia, unspecified: Secondary | ICD-10-CM | POA: Diagnosis not present

## 2016-04-10 DIAGNOSIS — Z86011 Personal history of benign neoplasm of the brain: Secondary | ICD-10-CM | POA: Diagnosis not present

## 2016-04-10 DIAGNOSIS — J9611 Chronic respiratory failure with hypoxia: Secondary | ICD-10-CM | POA: Diagnosis not present

## 2016-04-10 DIAGNOSIS — E039 Hypothyroidism, unspecified: Secondary | ICD-10-CM | POA: Diagnosis not present

## 2016-04-10 DIAGNOSIS — E785 Hyperlipidemia, unspecified: Secondary | ICD-10-CM | POA: Diagnosis not present

## 2016-04-11 DIAGNOSIS — J449 Chronic obstructive pulmonary disease, unspecified: Secondary | ICD-10-CM | POA: Diagnosis not present

## 2016-04-12 DIAGNOSIS — J441 Chronic obstructive pulmonary disease with (acute) exacerbation: Secondary | ICD-10-CM | POA: Diagnosis not present

## 2016-04-12 DIAGNOSIS — E039 Hypothyroidism, unspecified: Secondary | ICD-10-CM | POA: Diagnosis not present

## 2016-04-12 DIAGNOSIS — Z86011 Personal history of benign neoplasm of the brain: Secondary | ICD-10-CM | POA: Diagnosis not present

## 2016-04-12 DIAGNOSIS — Z9981 Dependence on supplemental oxygen: Secondary | ICD-10-CM | POA: Diagnosis not present

## 2016-04-12 DIAGNOSIS — J9611 Chronic respiratory failure with hypoxia: Secondary | ICD-10-CM | POA: Diagnosis not present

## 2016-04-12 DIAGNOSIS — Z7984 Long term (current) use of oral hypoglycemic drugs: Secondary | ICD-10-CM | POA: Diagnosis not present

## 2016-04-12 DIAGNOSIS — E785 Hyperlipidemia, unspecified: Secondary | ICD-10-CM | POA: Diagnosis not present

## 2016-04-12 DIAGNOSIS — J9612 Chronic respiratory failure with hypercapnia: Secondary | ICD-10-CM | POA: Diagnosis not present

## 2016-04-12 DIAGNOSIS — K219 Gastro-esophageal reflux disease without esophagitis: Secondary | ICD-10-CM | POA: Diagnosis not present

## 2016-04-12 DIAGNOSIS — R739 Hyperglycemia, unspecified: Secondary | ICD-10-CM | POA: Diagnosis not present

## 2016-04-12 DIAGNOSIS — T380X5D Adverse effect of glucocorticoids and synthetic analogues, subsequent encounter: Secondary | ICD-10-CM | POA: Diagnosis not present

## 2016-04-15 DIAGNOSIS — Z7984 Long term (current) use of oral hypoglycemic drugs: Secondary | ICD-10-CM | POA: Diagnosis not present

## 2016-04-15 DIAGNOSIS — J9611 Chronic respiratory failure with hypoxia: Secondary | ICD-10-CM | POA: Diagnosis not present

## 2016-04-15 DIAGNOSIS — K219 Gastro-esophageal reflux disease without esophagitis: Secondary | ICD-10-CM | POA: Diagnosis not present

## 2016-04-15 DIAGNOSIS — J9612 Chronic respiratory failure with hypercapnia: Secondary | ICD-10-CM | POA: Diagnosis not present

## 2016-04-15 DIAGNOSIS — E785 Hyperlipidemia, unspecified: Secondary | ICD-10-CM | POA: Diagnosis not present

## 2016-04-15 DIAGNOSIS — R739 Hyperglycemia, unspecified: Secondary | ICD-10-CM | POA: Diagnosis not present

## 2016-04-15 DIAGNOSIS — Z86011 Personal history of benign neoplasm of the brain: Secondary | ICD-10-CM | POA: Diagnosis not present

## 2016-04-15 DIAGNOSIS — Z9981 Dependence on supplemental oxygen: Secondary | ICD-10-CM | POA: Diagnosis not present

## 2016-04-15 DIAGNOSIS — J441 Chronic obstructive pulmonary disease with (acute) exacerbation: Secondary | ICD-10-CM | POA: Diagnosis not present

## 2016-04-15 DIAGNOSIS — T380X5D Adverse effect of glucocorticoids and synthetic analogues, subsequent encounter: Secondary | ICD-10-CM | POA: Diagnosis not present

## 2016-04-15 DIAGNOSIS — E039 Hypothyroidism, unspecified: Secondary | ICD-10-CM | POA: Diagnosis not present

## 2016-04-17 ENCOUNTER — Telehealth: Payer: Self-pay | Admitting: *Deleted

## 2016-04-17 NOTE — Telephone Encounter (Signed)
Physician order from [facility] signed and faxed.  Confirmation received.//AB/CMA

## 2016-04-18 DIAGNOSIS — J9612 Chronic respiratory failure with hypercapnia: Secondary | ICD-10-CM | POA: Diagnosis not present

## 2016-04-18 DIAGNOSIS — J441 Chronic obstructive pulmonary disease with (acute) exacerbation: Secondary | ICD-10-CM | POA: Diagnosis not present

## 2016-04-18 DIAGNOSIS — J9611 Chronic respiratory failure with hypoxia: Secondary | ICD-10-CM | POA: Diagnosis not present

## 2016-04-18 DIAGNOSIS — Z9981 Dependence on supplemental oxygen: Secondary | ICD-10-CM | POA: Diagnosis not present

## 2016-04-18 DIAGNOSIS — Z86011 Personal history of benign neoplasm of the brain: Secondary | ICD-10-CM | POA: Diagnosis not present

## 2016-04-18 DIAGNOSIS — K219 Gastro-esophageal reflux disease without esophagitis: Secondary | ICD-10-CM | POA: Diagnosis not present

## 2016-04-18 DIAGNOSIS — T380X5D Adverse effect of glucocorticoids and synthetic analogues, subsequent encounter: Secondary | ICD-10-CM | POA: Diagnosis not present

## 2016-04-18 DIAGNOSIS — R739 Hyperglycemia, unspecified: Secondary | ICD-10-CM | POA: Diagnosis not present

## 2016-04-18 DIAGNOSIS — E039 Hypothyroidism, unspecified: Secondary | ICD-10-CM | POA: Diagnosis not present

## 2016-04-18 DIAGNOSIS — Z7984 Long term (current) use of oral hypoglycemic drugs: Secondary | ICD-10-CM | POA: Diagnosis not present

## 2016-04-18 DIAGNOSIS — E785 Hyperlipidemia, unspecified: Secondary | ICD-10-CM | POA: Diagnosis not present

## 2016-04-19 ENCOUNTER — Telehealth: Payer: Self-pay | Admitting: *Deleted

## 2016-04-19 NOTE — Telephone Encounter (Signed)
Received completed and signed Physician Verbal Order form from [facility].  All forms faxed to [place] at [phone]).  Confirmation received.//AB/CMA

## 2016-04-21 ENCOUNTER — Other Ambulatory Visit: Payer: Self-pay | Admitting: Family

## 2016-04-22 ENCOUNTER — Telehealth: Payer: Self-pay | Admitting: Family

## 2016-04-22 NOTE — Telephone Encounter (Signed)
Relation to [id number] Call back [place] [facility], [place] - [address] AT [place] [phone] (Phone) [phone] (Fax)     Reason for call:  Patient requesting a refill nicotine (NICODERM CQ - DOSED IN MG/24 HOURS) 21 mg/24hr patch

## 2016-04-23 DIAGNOSIS — T380X5D Adverse effect of glucocorticoids and synthetic analogues, subsequent encounter: Secondary | ICD-10-CM | POA: Diagnosis not present

## 2016-04-23 DIAGNOSIS — E039 Hypothyroidism, unspecified: Secondary | ICD-10-CM | POA: Diagnosis not present

## 2016-04-23 DIAGNOSIS — R739 Hyperglycemia, unspecified: Secondary | ICD-10-CM | POA: Diagnosis not present

## 2016-04-23 DIAGNOSIS — Z7984 Long term (current) use of oral hypoglycemic drugs: Secondary | ICD-10-CM | POA: Diagnosis not present

## 2016-04-23 DIAGNOSIS — J441 Chronic obstructive pulmonary disease with (acute) exacerbation: Secondary | ICD-10-CM | POA: Diagnosis not present

## 2016-04-23 DIAGNOSIS — Z9981 Dependence on supplemental oxygen: Secondary | ICD-10-CM | POA: Diagnosis not present

## 2016-04-23 DIAGNOSIS — Z86011 Personal history of benign neoplasm of the brain: Secondary | ICD-10-CM | POA: Diagnosis not present

## 2016-04-23 DIAGNOSIS — E785 Hyperlipidemia, unspecified: Secondary | ICD-10-CM | POA: Diagnosis not present

## 2016-04-23 DIAGNOSIS — J9612 Chronic respiratory failure with hypercapnia: Secondary | ICD-10-CM | POA: Diagnosis not present

## 2016-04-23 DIAGNOSIS — K219 Gastro-esophageal reflux disease without esophagitis: Secondary | ICD-10-CM | POA: Diagnosis not present

## 2016-04-23 DIAGNOSIS — J9611 Chronic respiratory failure with hypoxia: Secondary | ICD-10-CM | POA: Diagnosis not present

## 2016-04-23 MED ORDER — NICOTINE 21 MG/24HR TD PT24: 21.0000 mg | MEDICATED_PATCH | Freq: Every day | TRANSDERMAL | 0 refills | Status: DC

## 2016-04-23 NOTE — Telephone Encounter (Signed)
rx sent to local pharmacy

## 2016-04-25 DIAGNOSIS — T380X5D Adverse effect of glucocorticoids and synthetic analogues, subsequent encounter: Secondary | ICD-10-CM | POA: Diagnosis not present

## 2016-04-25 DIAGNOSIS — K219 Gastro-esophageal reflux disease without esophagitis: Secondary | ICD-10-CM | POA: Diagnosis not present

## 2016-04-25 DIAGNOSIS — E785 Hyperlipidemia, unspecified: Secondary | ICD-10-CM | POA: Diagnosis not present

## 2016-04-25 DIAGNOSIS — Z7984 Long term (current) use of oral hypoglycemic drugs: Secondary | ICD-10-CM | POA: Diagnosis not present

## 2016-04-25 DIAGNOSIS — Z86011 Personal history of benign neoplasm of the brain: Secondary | ICD-10-CM | POA: Diagnosis not present

## 2016-04-25 DIAGNOSIS — J441 Chronic obstructive pulmonary disease with (acute) exacerbation: Secondary | ICD-10-CM | POA: Diagnosis not present

## 2016-04-25 DIAGNOSIS — J9611 Chronic respiratory failure with hypoxia: Secondary | ICD-10-CM | POA: Diagnosis not present

## 2016-04-25 DIAGNOSIS — R739 Hyperglycemia, unspecified: Secondary | ICD-10-CM | POA: Diagnosis not present

## 2016-04-25 DIAGNOSIS — J9612 Chronic respiratory failure with hypercapnia: Secondary | ICD-10-CM | POA: Diagnosis not present

## 2016-04-25 DIAGNOSIS — E039 Hypothyroidism, unspecified: Secondary | ICD-10-CM | POA: Diagnosis not present

## 2016-04-25 DIAGNOSIS — Z9981 Dependence on supplemental oxygen: Secondary | ICD-10-CM | POA: Diagnosis not present

## 2016-04-29 ENCOUNTER — Other Ambulatory Visit: Payer: Self-pay

## 2016-04-30 ENCOUNTER — Telehealth: Payer: Self-pay | Admitting: Family

## 2016-04-30 DIAGNOSIS — J9611 Chronic respiratory failure with hypoxia: Secondary | ICD-10-CM | POA: Diagnosis not present

## 2016-04-30 DIAGNOSIS — Z86011 Personal history of benign neoplasm of the brain: Secondary | ICD-10-CM | POA: Diagnosis not present

## 2016-04-30 DIAGNOSIS — Z7984 Long term (current) use of oral hypoglycemic drugs: Secondary | ICD-10-CM | POA: Diagnosis not present

## 2016-04-30 DIAGNOSIS — J9612 Chronic respiratory failure with hypercapnia: Secondary | ICD-10-CM | POA: Diagnosis not present

## 2016-04-30 DIAGNOSIS — E785 Hyperlipidemia, unspecified: Secondary | ICD-10-CM | POA: Diagnosis not present

## 2016-04-30 DIAGNOSIS — T380X5D Adverse effect of glucocorticoids and synthetic analogues, subsequent encounter: Secondary | ICD-10-CM | POA: Diagnosis not present

## 2016-04-30 DIAGNOSIS — K219 Gastro-esophageal reflux disease without esophagitis: Secondary | ICD-10-CM | POA: Diagnosis not present

## 2016-04-30 DIAGNOSIS — R739 Hyperglycemia, unspecified: Secondary | ICD-10-CM | POA: Diagnosis not present

## 2016-04-30 DIAGNOSIS — J441 Chronic obstructive pulmonary disease with (acute) exacerbation: Secondary | ICD-10-CM | POA: Diagnosis not present

## 2016-04-30 DIAGNOSIS — E039 Hypothyroidism, unspecified: Secondary | ICD-10-CM | POA: Diagnosis not present

## 2016-04-30 DIAGNOSIS — Z9981 Dependence on supplemental oxygen: Secondary | ICD-10-CM | POA: Diagnosis not present

## 2016-04-30 NOTE — Telephone Encounter (Signed)
Attempted to reach pt and received message that wireless customer is unavailable. Notified Megan at below # and she will have Physical Therapist notify pt their visit tomorrow.

## 2016-04-30 NOTE — Telephone Encounter (Signed)
Please schedule OV.  

## 2016-04-30 NOTE — Telephone Encounter (Signed)
Caller name:Megan  Relationship to patient: Home Care PT Can be reached: [phone]   Reason for call: Patient is being d/c'd from PT today but therapist wanted to inform PCP that patient appears to have a cough. Patient informed her that she feels as if she needs to cough something up but can not. Patient is currently using Nebulizer and has had a breathing Tx

## 2016-05-01 ENCOUNTER — Other Ambulatory Visit: Payer: Self-pay | Admitting: Pharmacist

## 2016-05-01 DIAGNOSIS — R739 Hyperglycemia, unspecified: Secondary | ICD-10-CM | POA: Diagnosis not present

## 2016-05-01 DIAGNOSIS — T380X5D Adverse effect of glucocorticoids and synthetic analogues, subsequent encounter: Secondary | ICD-10-CM | POA: Diagnosis not present

## 2016-05-01 DIAGNOSIS — E785 Hyperlipidemia, unspecified: Secondary | ICD-10-CM | POA: Diagnosis not present

## 2016-05-01 DIAGNOSIS — Z86011 Personal history of benign neoplasm of the brain: Secondary | ICD-10-CM | POA: Diagnosis not present

## 2016-05-01 DIAGNOSIS — E039 Hypothyroidism, unspecified: Secondary | ICD-10-CM | POA: Diagnosis not present

## 2016-05-01 DIAGNOSIS — Z7984 Long term (current) use of oral hypoglycemic drugs: Secondary | ICD-10-CM | POA: Diagnosis not present

## 2016-05-01 DIAGNOSIS — K219 Gastro-esophageal reflux disease without esophagitis: Secondary | ICD-10-CM | POA: Diagnosis not present

## 2016-05-01 DIAGNOSIS — J9611 Chronic respiratory failure with hypoxia: Secondary | ICD-10-CM | POA: Diagnosis not present

## 2016-05-01 DIAGNOSIS — J441 Chronic obstructive pulmonary disease with (acute) exacerbation: Secondary | ICD-10-CM | POA: Diagnosis not present

## 2016-05-01 DIAGNOSIS — Z9981 Dependence on supplemental oxygen: Secondary | ICD-10-CM | POA: Diagnosis not present

## 2016-05-01 DIAGNOSIS — J9612 Chronic respiratory failure with hypercapnia: Secondary | ICD-10-CM | POA: Diagnosis not present

## 2016-05-01 NOTE — Patient Outreach (Signed)
Outreach call to Kathryn Buckley regarding her request for follow up from the [facility] Medication Adherence Campaign. Called and spoke with patient. HIPAA identifiers verified and verbal consent received.   Ms. Kathryn Buckley reports that her cholesterol medication was switched from simvastatin to atorvastatin last month. Denies any issues with medication adherence. Denies any issue with obtaining or taking the atorvastatin. Patient denies any medication questions or concerns at this time. Patient declines to talk further about her medications.  Harlow Asa, PharmD Clinical Pharmacist [facility] [phone]

## 2016-05-02 ENCOUNTER — Ambulatory Visit (HOSPITAL_COMMUNITY): Payer: Self-pay | Admitting: Psychiatry

## 2016-05-11 ENCOUNTER — Other Ambulatory Visit: Payer: Self-pay | Admitting: Family

## 2016-05-12 DIAGNOSIS — J449 Chronic obstructive pulmonary disease, unspecified: Secondary | ICD-10-CM | POA: Diagnosis not present

## 2016-05-13 NOTE — Telephone Encounter (Signed)
°  Relation to [id number] Call back number:[phone] Pharmacy: [facility], [place] - [address] AT [place] [phone] ([phone] (Fax)     Reason for call:  Patient requesting a refill metFORMIN (GLUCOPHAGE) 500 MG tablet

## 2016-05-16 ENCOUNTER — Ambulatory Visit: Payer: Self-pay | Admitting: Neurology

## 2016-05-22 ENCOUNTER — Other Ambulatory Visit (HOSPITAL_COMMUNITY): Payer: Self-pay | Admitting: Psychiatry

## 2016-05-22 DIAGNOSIS — F3131 Bipolar disorder, current episode depressed, mild: Secondary | ICD-10-CM

## 2016-05-28 ENCOUNTER — Telehealth (HOSPITAL_COMMUNITY): Payer: Self-pay

## 2016-05-28 DIAGNOSIS — F3131 Bipolar disorder, current episode depressed, mild: Secondary | ICD-10-CM

## 2016-05-28 MED ORDER — FLUOXETINE HCL 20 MG PO CAPS: 20.0000 mg | ORAL_CAPSULE | Freq: Every day | ORAL | 0 refills | Status: DC

## 2016-05-28 MED ORDER — LITHIUM CARBONATE ER 300 MG PO TBCR: 600.0000 mg | EXTENDED_RELEASE_TABLET | Freq: Every day | ORAL | 0 refills | Status: DC

## 2016-05-28 NOTE — Telephone Encounter (Signed)
Telephone call with patient requesting new 90 day orders for her Lithium and Prozac as was rescheduled from 05/02/16 until 06/12/16 but is now running out of both.  Discussed with Dr. Lovena Le who approved one time new 90 day orders for patient. New 90 day orders of patient's prescribed Lithium and Prozac e-scribed to patient's [facility] in [place]. Called patient to inform orders were approved and sent to her pharmacy and reminded of next appointment now set for 06/12/16.

## 2016-06-10 ENCOUNTER — Other Ambulatory Visit: Payer: Self-pay | Admitting: Family

## 2016-06-12 ENCOUNTER — Ambulatory Visit (HOSPITAL_COMMUNITY): Payer: Self-pay | Admitting: Psychiatry

## 2016-06-12 DIAGNOSIS — J449 Chronic obstructive pulmonary disease, unspecified: Secondary | ICD-10-CM | POA: Diagnosis not present

## 2016-06-19 DIAGNOSIS — K219 Gastro-esophageal reflux disease without esophagitis: Secondary | ICD-10-CM | POA: Diagnosis not present

## 2016-06-19 DIAGNOSIS — E785 Hyperlipidemia, unspecified: Secondary | ICD-10-CM | POA: Diagnosis not present

## 2016-06-19 DIAGNOSIS — I10 Essential (primary) hypertension: Secondary | ICD-10-CM | POA: Diagnosis not present

## 2016-06-19 DIAGNOSIS — Z45018 Encounter for adjustment and management of other part of cardiac pacemaker: Secondary | ICD-10-CM | POA: Diagnosis not present

## 2016-06-24 ENCOUNTER — Telehealth (HOSPITAL_COMMUNITY): Payer: Self-pay

## 2016-06-24 NOTE — Telephone Encounter (Signed)
Patient is calling because she said that [employer] is no longer going to cover Seroquel. Patient would like to know what she should do, she can not afford to get it without the program. She would like to know if you can replace it with something.Please review and advise, thank you

## 2016-06-27 NOTE — Telephone Encounter (Signed)
Called patient to discuss discount programs such as [employer], patient states that the Seroquel really does not work anyway and she has been on it for 16 years. She would like to know if you have an alternative for her. Please review and advise, thank you

## 2016-06-28 DIAGNOSIS — Z8679 Personal history of other diseases of the circulatory system: Secondary | ICD-10-CM | POA: Diagnosis not present

## 2016-06-28 DIAGNOSIS — J9611 Chronic respiratory failure with hypoxia: Secondary | ICD-10-CM | POA: Diagnosis not present

## 2016-06-28 DIAGNOSIS — R0683 Snoring: Secondary | ICD-10-CM | POA: Diagnosis not present

## 2016-06-28 DIAGNOSIS — Z720 Tobacco use: Secondary | ICD-10-CM | POA: Diagnosis not present

## 2016-06-28 DIAGNOSIS — J449 Chronic obstructive pulmonary disease, unspecified: Secondary | ICD-10-CM | POA: Diagnosis not present

## 2016-07-02 ENCOUNTER — Ambulatory Visit: Payer: Self-pay | Admitting: Family

## 2016-07-04 ENCOUNTER — Other Ambulatory Visit: Payer: Self-pay | Admitting: Family

## 2016-07-12 DIAGNOSIS — J449 Chronic obstructive pulmonary disease, unspecified: Secondary | ICD-10-CM | POA: Diagnosis not present

## 2016-07-12 DIAGNOSIS — R0602 Shortness of breath: Secondary | ICD-10-CM | POA: Diagnosis not present

## 2016-07-16 ENCOUNTER — Encounter: Payer: Self-pay | Admitting: Medical

## 2016-07-16 ENCOUNTER — Ambulatory Visit (INDEPENDENT_AMBULATORY_CARE_PROVIDER_SITE_OTHER): Payer: Medicare Other | Admitting: Medical

## 2016-07-16 VITALS — BP 100/60 | Ht 69.0 in

## 2016-07-16 DIAGNOSIS — R319 Hematuria, unspecified: Secondary | ICD-10-CM

## 2016-07-16 DIAGNOSIS — N390 Urinary tract infection, site not specified: Secondary | ICD-10-CM | POA: Diagnosis not present

## 2016-07-16 DIAGNOSIS — R82998 Other abnormal findings in urine: Secondary | ICD-10-CM

## 2016-07-16 DIAGNOSIS — R8299 Other abnormal findings in urine: Secondary | ICD-10-CM | POA: Diagnosis not present

## 2016-07-16 LAB — POC URINALSYSI DIPSTICK (AUTOMATED)
BILIRUBIN UA: NEGATIVE
GLUCOSE UA: NEGATIVE
KETONES UA: NEGATIVE
Nitrite, UA: NEGATIVE
PH UA: 6.5
Protein, UA: NEGATIVE
Spec Grav, UA: 1.01
Urobilinogen, UA: 1

## 2016-07-16 MED ORDER — CIPROFLOXACIN HCL 500 MG PO TABS: 500.0000 mg | ORAL_TABLET | Freq: Two times a day (BID) | ORAL | 0 refills | Status: DC

## 2016-07-16 NOTE — Progress Notes (Signed)
Pre visit review using our clinic review tool, if applicable. No additional management support is needed unless otherwise documented below in the visit note./hsm  

## 2016-07-16 NOTE — Progress Notes (Signed)
Subjective:    Patient ID: Kathryn Buckley, female    DOB: 01-30-1951, 65 y.o.   MRN: [id number]  HPI  Pt in for  week of urinary symptoms. (Pt saw pulmonolgist and pt thinks anora inhaler may have caused after she saw commercial on tv which she states mentioned uti can occur) She stopped the inhaler. Reports no respiratory issues today.  Pt in today reporting urinary symptoms.  Dysuria- yes.Moderate to severe pain. Frequent urination- yes Hesitancy-yes Suprapubic pressure-yes Fever-no chills-no  Nausea-no Vomiting-no CVA pain-no. But states faint middle of back no. History of UTI-not that she can remember. Gross hematuria-no.  Pt blood sugar was 163 yesterday. All reading past week less than 200.   Review of Systems  Constitutional: Negative for chills, fatigue and fever.  Respiratory: Negative for cough, chest tightness, shortness of breath and wheezing.   Cardiovascular: Negative for chest pain and palpitations.  Gastrointestinal: Negative for abdominal pain, constipation, diarrhea, nausea and vomiting.  Genitourinary: Positive for dysuria, frequency and urgency. Negative for decreased urine volume and vaginal pain.  Musculoskeletal: Negative for back pain.       See hpi. No pain presently.  Skin: Negative for rash.  Neurological: Negative for dizziness, weakness, light-headedness and headaches.  Hematological: Negative for adenopathy. Does not bruise/bleed easily.  Psychiatric/Behavioral: Negative for behavioral problems and confusion.    Past Medical History:  Diagnosis Date  . COPD (chronic obstructive pulmonary disease) ([place])   . Depression   . HTN (hypertension)   . Hyperlipemia   . Seizures ([place])    Due to brain tumor that was removed in 2004  . Thyroid disease      Social History   Social History  . Marital status: Married    Spouse name: N/A  . Number of children: N/A  . Years of education: N/A   Occupational History  . Not on file.    Social History Main Topics  . Smoking status: Former Smoker    Packs/day: 2.50    Years: 40.00    Types: Cigarettes    Quit date: 03/14/2016  . Smokeless tobacco: Never Used  . Alcohol use No  . Drug use: No  . Sexual activity: Not on file   Other Topics Concern  . Not on file   Social History Narrative   Lives with husband and 2 chihuahua   She has 2 children- one son died of drug overdose   1 living son- [name]- lives in [place].     2 step sons   She has worked in the past in Research [employer], trade [employer])   She enjoys TV- likes to be home.             Past Surgical History:  Procedure Laterality Date  . BRAIN SURGERY      Family History  Problem Relation Age of Onset  . Bipolar disorder Father   . Heart disease Father   . Cancer Father     prostate  . Heart disease Mother   . Hypertension Brother   . Diabetes Brother   . Stroke Neg Hx   . Hyperlipidemia Neg Hx     Allergies  Allergen Reactions  . Lamictal [Lamotrigine] Rash  . Codeine     Other reaction(s): GI Upset (intolerance)    Current Outpatient Prescriptions on File Prior to Visit  Medication Sig Dispense Refill  . albuterol (PROVENTIL) (2.5 MG/3ML) 0.083% nebulizer solution 2.5 mg. Inhale 3 ml by nebulization every 4 hours as  needed for wheezing.    Marland Kitchen ALPRAZolam (XANAX) 0.25 MG tablet Take 1 tablet (0.25 mg total) by mouth 2 (two) times daily as needed for anxiety. 30 tablet 0  . atorvastatin (LIPITOR) 40 MG tablet Take 1 tablet (40 mg total) by mouth daily. 90 tablet 0  . Blood Glucose Monitoring Suppl (FREESTYLE LITE) DEVI Test daily before all meals/snacks and once before bedtime.    . fenofibrate (TRICOR) 145 MG tablet Take 1 tablet (145 mg total) by mouth daily. 90 tablet 0  . FLUoxetine (PROZAC) 20 MG capsule Take 1 capsule (20 mg total) by mouth daily. 90 capsule 0  . glucose blood (FREESTYLE LITE) test strip Test daily before all meals/snacks and once before bedtime.    Marland Kitchen  L-Methylfolate-B12-B6-B2 (METAFOLBIC) 02-21-49-5 MG TABS Take 1 tablet by mouth daily.  0  . Lancets (FREESTYLE) lancets Test daily before all meals/snacks and once before bedtime.    Marland Kitchen levothyroxine (SYNTHROID, LEVOTHROID) 150 MCG tablet TAKE 1 TABLET BY MOUTH EVERY DAY 90 tablet 1  . lithium carbonate (LITHOBID) 300 MG CR tablet Take 2 tablets (600 mg total) by mouth at bedtime. 180 tablet 0  . losartan (COZAAR) 50 MG tablet Take 1 tablet by mouth  daily 90 tablet 0  . metFORMIN (GLUCOPHAGE) 500 MG tablet TAKE 1 TABLET(500 MG) BY MOUTH TWICE DAILY WITH A MEAL 60 tablet 2  . Methylfol-Methylcob-Acetylcyst 6-2-600 MG TABS Take 1 tablet by mouth daily.    . nicotine (NICODERM CQ - DOSED IN MG/24 HOURS) 21 mg/24hr patch Place 1 patch (21 mg total) onto the skin daily. 28 patch 0  . omeprazole (PRILOSEC) 20 MG capsule TAKE 1 CAPSULE BY MOUTH  DAILY 90 capsule 0  . QUEtiapine (SEROQUEL XR) 400 MG 24 hr tablet Take 1 tablet (400 mg total) by mouth at bedtime. 90 tablet 0  . levETIRAcetam (KEPPRA) 500 MG tablet Take 500 mg by mouth 2 (two) times daily with a meal.     No current facility-administered medications on file prior to visit.     BP 100/60   Ht 5\' 9"  (1.753 m)   LMP 09/23/1988   SpO2 99%       Objective:   Physical Exam  General Appearance- Not in acute distress.  HEENT Eyes- Scleraeral/Conjuntiva-bilat- Not Yellow. Mouth & Throat- Normal.  Chest and Lung Exam Auscultation: Breath sounds:-Normal. Adventitious sounds:- No Adventitious sounds.  Cardiovascular Auscultation:Rythm - Regular. Heart Sounds -Normal heart sounds.  Abdomen Inspection:-Inspection Normal.  Palpation/Perucssion: Palpation and Percussion of the abdomen reveal- Non Tender, No Rebound tenderness, No rigidity(Guarding) and No Palpable abdominal masses.  Liver:-Normal.  Spleen:- Normal.    Back- no cva pain. No mid lspine pain.        Assessment & Plan:  You appear to have a urinary tract  infection. I am prescribing cipro antibiotic for the probable infection. Hydrate well. I am sending out a urine culture. During the interim if your signs and symptoms worsen rather than improving please notify us. We will notify you when the culture results are back.  Keep checking bs daily since you are diabetic. If sugars are increasing over 200 notify us.  Recheck urine for blood in 10 days.   Note I asked pt to contact her pulmonologist about anora and uti. Pulmonologist may offer other inhaler alternative. She agreed she would do so.  Follow up in 7-10 days or as needed.

## 2016-07-16 NOTE — Patient Instructions (Addendum)
You appear to have a urinary tract infection. I am prescribing cipro antibiotic for the probable infection. Hydrate well. I am sending out a urine culture. During the interim if your signs and symptoms worsen rather than improving please notify us. We will notify you when the culture results are back.   Keep checking bs daily since you are diabetic. If sugars are increasing over 200 notify us.  Recheck urine for blood in 10 days.   Follow up in 7-10days or as needed.

## 2016-07-18 ENCOUNTER — Other Ambulatory Visit: Payer: Self-pay | Admitting: Family

## 2016-07-18 LAB — URINE CULTURE

## 2016-07-23 ENCOUNTER — Ambulatory Visit (INDEPENDENT_AMBULATORY_CARE_PROVIDER_SITE_OTHER): Payer: Medicare Other | Admitting: Psychiatry

## 2016-07-23 ENCOUNTER — Encounter (HOSPITAL_COMMUNITY): Payer: Self-pay | Admitting: Psychiatry

## 2016-07-23 VITALS — BP 110/68 | HR 76 | Ht 69.5 in | Wt 176.0 lb

## 2016-07-23 DIAGNOSIS — F3131 Bipolar disorder, current episode depressed, mild: Secondary | ICD-10-CM | POA: Diagnosis not present

## 2016-07-23 MED ORDER — FLUOXETINE HCL 10 MG PO CAPS: 30.0000 mg | ORAL_CAPSULE | Freq: Every day | ORAL | 1 refills | Status: DC

## 2016-07-23 MED ORDER — QUETIAPINE FUMARATE 100 MG PO TABS: 100.0000 mg | ORAL_TABLET | Freq: Every day | ORAL | 1 refills | Status: DC

## 2016-07-23 MED ORDER — LITHIUM CARBONATE ER 300 MG PO TBCR: 600.0000 mg | EXTENDED_RELEASE_TABLET | Freq: Every day | ORAL | 0 refills | Status: DC

## 2016-07-23 NOTE — Progress Notes (Signed)
[facility] [phone] Progress Note  [name] [id number] 65 y.o.  07/23/2016 10:57 AM  Chief Complaint:  I am not feeling well.  I'm more depressed.  I cannot sleep.  I quit smoking but due to lack of sleep I start smoking 6 weeks ago.  I'm not getting Seroquel from pharmaceutical company.         History of Present Illness: Kathryn Buckley came for her followup appointment.  She is complaining of increased anxiety, depression, lack of sleep and feeling isolated and withdrawn.  She is frustrated because [facility] has not given Seroquel XR since June.  She was told that she does not qualify anymore .  We have provided discount card to get genetic Seroquel but she apologizes that she never filled the prescription.  She has lost total 13 pounds in past few months.  Though she denies any suicidal thoughts but endorsed easily fatigue, lack of energy, lack of concentration.  She also worried about her health issues.  Recently she has put a pacemaker in June when her heart stops abruptly and luckily her sister called the ambulance and she was taken to the hospital.  After that she had developed infection in her bladder and also bronchitis.  She is taking antibiotic.  She admitted lack of appetite and feeling tired.  Her primary care physician recommended to stop smoking and she quit and at that time she also took Xanax but due to her increased anxiety she start smoking extremely liter.  She like to go back on Seroquel which helped her in the past.  She was taking Seroquel for more than 16 years.  I review her blood work, she do not have lithium level which was ordered.  Her cholesterol is 212.  She is taking antibiotic for her bladder infection.  Patient denies drinking alcohol or using any illegal substances.  Since she had pacemaker she is feeling better as she does not have any more dizziness.  Patient denies any mania, psychosis, hallucination.  She has no tremors or shakes.  Today her vitals  are stable except she lost weight.  She is compliant with Prozac 20 mg and lithium 600 mg twice a day.   Suicidal Ideation: No Plan Formed: No Patient has means to carry out plan: No  Homicidal Ideation: No Plan Formed: No Patient has means to carry out plan: No  ROS Psychiatric: Agitation: No Hallucination: No Depressed Mood: Yes Insomnia: Yes Hypersomnia: No Altered Concentration: No Feels Worthless: No Grandiose Ideas: No Belief In Special Powers: No New/Increased Substance Abuse: No Compulsions: No  Neurologic: Headache: Yes Seizure: Patient has history of seizures after brain surgery.   Paresthesias: No  Medical History:  She has hypertension, hypothyroidism and hyperlipidemia.  She has history of brain tumor status post surgery.  She had seizures however there has been no recent seizure like activity.  She had a pacemaker in June 2017 Her primary care physician is Kathryn Buckley.   Outpatient Encounter Prescriptions as of 07/23/2016  Medication Sig Dispense Refill  . albuterol (PROVENTIL) (2.5 MG/3ML) 0.083% nebulizer solution 2.5 mg. Inhale 3 ml by nebulization every 4 hours as needed for wheezing.    Marland Kitchen atorvastatin (LIPITOR) 40 MG tablet TAKE 1 TABLET(40 MG) BY MOUTH DAILY 90 tablet 0  . Blood Glucose Monitoring Suppl (FREESTYLE LITE) DEVI Test daily before all meals/snacks and once before bedtime.    . ciprofloxacin (CIPRO) 500 MG tablet Take 1 tablet (500 mg total) by mouth 2 (two) times  daily. 14 tablet 0  . fenofibrate (TRICOR) 145 MG tablet Take 1 tablet (145 mg total) by mouth daily. 90 tablet 0  . FLUoxetine (PROZAC) 10 MG capsule Take 3 capsules (30 mg total) by mouth daily. 90 capsule 1  . glucose blood (FREESTYLE LITE) test strip Test daily before all meals/snacks and once before bedtime.    Marland Kitchen L-Methylfolate-B12-B6-B2 (METAFOLBIC) 02-21-49-5 MG TABS Take 1 tablet by mouth daily.  0  . Lancets (FREESTYLE) lancets Test daily before all meals/snacks and once  before bedtime.    Marland Kitchen levothyroxine (SYNTHROID, LEVOTHROID) 150 MCG tablet TAKE 1 TABLET BY MOUTH EVERY DAY 90 tablet 1  . lithium carbonate (LITHOBID) 300 MG CR tablet Take 2 tablets (600 mg total) by mouth at bedtime. 180 tablet 0  . losartan (COZAAR) 50 MG tablet Take 1 tablet by mouth  daily 90 tablet 0  . metFORMIN (GLUCOPHAGE) 500 MG tablet TAKE 1 TABLET(500 MG) BY MOUTH TWICE DAILY WITH A MEAL 60 tablet 2  . Methylfol-Methylcob-Acetylcyst 6-2-600 MG TABS Take 1 tablet by mouth daily.    . nicotine (NICODERM CQ - DOSED IN MG/24 HOURS) 21 mg/24hr patch Place 1 patch (21 mg total) onto the skin daily. 28 patch 0  . omeprazole (PRILOSEC) 20 MG capsule TAKE 1 CAPSULE BY MOUTH  DAILY 90 capsule 0  . QUEtiapine (SEROQUEL) 100 MG tablet Take 1 tablet (100 mg total) by mouth at bedtime. 30 tablet 1  . [DISCONTINUED] ALPRAZolam (XANAX) 0.25 MG tablet Take 1 tablet (0.25 mg total) by mouth 2 (two) times daily as needed for anxiety. 30 tablet 0  . [DISCONTINUED] FLUoxetine (PROZAC) 20 MG capsule Take 1 capsule (20 mg total) by mouth daily. 90 capsule 0  . [DISCONTINUED] levETIRAcetam (KEPPRA) 500 MG tablet Take 500 mg by mouth 2 (two) times daily with a meal.    . [DISCONTINUED] lithium carbonate (LITHOBID) 300 MG CR tablet Take 2 tablets (600 mg total) by mouth at bedtime. 180 tablet 0  . [DISCONTINUED] QUEtiapine (SEROQUEL XR) 400 MG 24 hr tablet Take 1 tablet (400 mg total) by mouth at bedtime. 90 tablet 0   No facility-administered encounter medications on file as of 07/23/2016.     Past Psychiatric History/Hospitalization(s): Patient has been seeing in this office since 2008. She was not happy with her current treatment at [facility] . Patient has long history of bipolar disorder. She has tried in the past Lexapro Cymbalta, Paxil , Zoloft and Geodon.  Patient denies any history of suicidal attempt . She also tried Celexa which worked very well but due to depression with Nexium it was switched to  Prozac. Anxiety: Yes Bipolar Disorder: Yes Depression: Yes Mania: Yes Psychosis: No Schizophrenia: No Personality Disorder: No Hospitalization for psychiatric illness: Yes History of Electroconvulsive Shock Therapy: No Prior Suicide Attempts: No  Physical Exam: Recent Results (from the past 2160 hour(s))  POCT Urinalysis Dipstick (Automated)     Status: Abnormal   Collection Time: 07/16/16 11:54 AM  Result Value Ref Range   Color, UA Light Yellow    Clarity, UA Cloudy    Glucose, UA neg    Bilirubin, UA neg    Ketones, UA neg    Spec Grav, UA 1.010    Blood, UA trace    pH, UA 6.5    Protein, UA neg    Urobilinogen, UA 1.0    Nitrite, UA neg    Leukocytes, UA large (3+) (A) Negative  Urine Culture     Status:  None   Collection Time: 07/16/16 12:26 PM  Result Value Ref Range   Culture KLEBSIELLA PNEUMONIAE    Colony Count Greater than 100,000 CFU/mL    Organism ID, Bacteria KLEBSIELLA PNEUMONIAE       Susceptibility   Klebsiella pneumoniae -  (no method available)    AMPICILLIN >=32 Resistant     AMOX/CLAVULANIC <=2 Sensitive     AMPICILLIN/SULBACTAM 4 Sensitive     PIP/TAZO <=4 Sensitive     IMIPENEM <=0.25 Sensitive     CEFAZOLIN <=4 Not Reportable     CEFTRIAXONE <=1 Sensitive     CEFTAZIDIME <=1 Sensitive     CEFEPIME <=1 Sensitive     GENTAMICIN <=1 Sensitive     TOBRAMYCIN <=1 Sensitive     CIPROFLOXACIN <=0.25 Sensitive     LEVOFLOXACIN <=0.12 Sensitive     NITROFURANTOIN 64 Intermediate     TRIMETH/SULFA* <=20 Sensitive      * NR=NOT REPORTABLE,SEE COMMENTORAL therapy:A cefazolin MIC of <32 predicts susceptibility to the oral agents cefaclor,cefdinir,cefpodoxime,cefprozil,cefuroxime,cephalexin,and loracarbef when used for therapy of uncomplicated UTIs due to E.coli,K.pneumomiae,and P.mirabilis. PARENTERAL therapy: A cefazolinMIC of >8 indicates resistance to parenteralcefazolin. An alternate test method must beperformed to confirm susceptibility to  parenteralcefazolin.   Constitutional:  BP 110/68 (BP Location: Left Arm, Patient Position: Sitting, Cuff Size: Normal)   Pulse 76   Ht 5' 9.5" (1.765 m)   Wt 176 lb (79.8 kg)   LMP 09/23/1988   BMI 25.62 kg/m   General Appearance: alert, oriented, no acute distress and well nourished  Musculoskeletal: Strength & Muscle Tone: within normal limits Gait & Station: normal Patient leans: N/A  Psychiatric Specialty Exam: Physical Exam  Review of Systems  Constitutional: Negative.   Respiratory: Positive for shortness of breath.   Cardiovascular: Negative for chest pain and palpitations.  Musculoskeletal: Negative.   Skin: Negative for itching and rash.  Neurological: Negative for dizziness and tremors.  Psychiatric/Behavioral: Positive for depression. Negative for hallucinations, substance abuse and suicidal ideas. The patient is nervous/anxious and has insomnia.     Blood pressure 110/68, pulse 76, height 5' 9.5" (1.765 m), weight 176 lb (79.8 kg), last menstrual period 09/23/1988.Body mass index is 25.62 kg/m.  General Appearance: Casual  Eye Contact::  Good  Speech:  Normal Rate  Volume:  Normal  Mood:  Anxious and Depressed  Affect:  Appropriate  Thought Process:  Coherent  Orientation:  Full (Time, Place, and Person)  Thought Content:  WDL  Suicidal Thoughts:  No  Homicidal Thoughts:  No  Memory:  Immediate;   Good Recent;   Good Remote;   Good  Judgement:  Good  Insight:  Good  Psychomotor Activity:  Normal  Concentration:  Fair  Recall:  [place] of Knowledge:  Good  Language:  Good  Akathisia:  No  Handed:  Right  AIMS (if indicated):     Assets:  Communication Skills Desire for Improvement Financial Resources/Insurance Housing Physical Health Social Support  ADL's:  Intact  Cognition:  WNL  Sleep:       Established Problem, Stable/Improving (1), Review of Psycho-Social Stressors (1), Review or order clinical lab tests (1), Review and summation  of old records (2), Established Problem, Worsening (2), Review of Last Therapy Session (1), Review of Medication Regimen & Side Effects (2) and Review of New Medication or Change in Dosage (2)  Assessment: Axis I: Bipolar disorder NOS  Axis II: Deferred  Axis III:  Patient Active Problem List   Diagnosis  Date Noted  . Seizure disorder ([place]) 04/01/2016  . History of third degree heart block 04/01/2016  . Hyperlipidemia 12/14/2014  . Bipolar 1 disorder, mixed, moderate ([employer]) 08/25/2014  . Chronic respiratory failure with hypoxia ([place]) 08/25/2014  . COPD (chronic obstructive pulmonary disease) ([place]) 07/03/2014  . Basal cell carcinoma of cheek 06/04/2013  . Routine general medical examination at a health care facility 10/15/2012  . Abnormal EKG 10/15/2012  . Hypoxia 10/15/2012  . HTN (hypertension) 09/06/2012  . Hypothyroid 09/06/2012  . GERD (gastroesophageal reflux disease) 09/06/2012  . Tobacco abuse 09/06/2012  . Depression 11/18/2011   Plan:  I reviewed records from other providers including recent blood work results and current medication.  She is not taking Seroquel and her depression and irritability is getting worse.  I will increase Prozac 30 mg daily, reinforce lithium level and recommended to start Seroquel 100 mg generic at bedtime as patient does not qualify from Seroquel XR from [facility].  She used to take 400 mg but we will start low-dose as she has not taken in a while.  Strongly encouraged to stop smoking .  I offer counseling but patient declined.  Discuss safety plan that anytime having active suicidal thoughts or homicidal thoughts and she need to call 911 or go to the local emergency room.  Discussed medication side effects and benefits.  Follow-up in 6 weeks.  Kathryn Buckley T., MD 07/23/2016                      Patient ID: Kathryn Buckley, female   DOB: 20-May-1951, 65 y.o.   MRN: [id number]

## 2016-07-25 ENCOUNTER — Telehealth: Payer: Self-pay | Admitting: Family

## 2016-07-25 NOTE — Telephone Encounter (Signed)
Relation to OP:WKHI Call back number:[phone] Pharmacy: [facility], [place] - [address] AT [place] [phone] (Phone) [phone] (Fax)     Reason for call:  Patient requesting a refill losartan (COZAAR) 50 MG tablet

## 2016-07-26 MED ORDER — LOSARTAN POTASSIUM 50 MG PO TABS: 50.0000 mg | ORAL_TABLET | Freq: Every day | ORAL | 0 refills | Status: DC

## 2016-07-26 NOTE — Telephone Encounter (Signed)
lvm for pt advising that an appt is needed to continue medication refills

## 2016-07-26 NOTE — Telephone Encounter (Signed)
Mailed letter to pt

## 2016-07-26 NOTE — Telephone Encounter (Signed)
Pt was supposed to have seen PCP on 07/02/16 and cancelled appt. 2 week supply was sent to [facility] and pt will need to be seen in the office for further refills. Appt on 07/16/16 was for an acute visit with PA, Saguier.  Please call pt to schedule appt. Thanks!

## 2016-08-05 ENCOUNTER — Other Ambulatory Visit: Payer: Self-pay | Admitting: Family

## 2016-08-05 NOTE — Telephone Encounter (Signed)
Rx request to pharmacy/[facility]  

## 2016-08-12 DIAGNOSIS — J449 Chronic obstructive pulmonary disease, unspecified: Secondary | ICD-10-CM | POA: Diagnosis not present

## 2016-08-19 ENCOUNTER — Other Ambulatory Visit: Payer: Self-pay | Admitting: Family

## 2016-08-22 ENCOUNTER — Other Ambulatory Visit: Payer: Self-pay | Admitting: Family

## 2016-08-22 ENCOUNTER — Ambulatory Visit (HOSPITAL_COMMUNITY): Payer: Self-pay | Admitting: Psychiatry

## 2016-08-22 NOTE — Telephone Encounter (Signed)
Medication filled to pharmacy as requested.   

## 2016-09-02 ENCOUNTER — Other Ambulatory Visit: Payer: Self-pay | Admitting: Family

## 2016-09-04 ENCOUNTER — Ambulatory Visit (HOSPITAL_COMMUNITY): Payer: Self-pay | Admitting: Psychiatry

## 2016-09-11 ENCOUNTER — Other Ambulatory Visit (HOSPITAL_COMMUNITY): Payer: Self-pay | Admitting: Psychiatry

## 2016-09-11 DIAGNOSIS — J449 Chronic obstructive pulmonary disease, unspecified: Secondary | ICD-10-CM | POA: Diagnosis not present

## 2016-09-11 DIAGNOSIS — F3131 Bipolar disorder, current episode depressed, mild: Secondary | ICD-10-CM

## 2016-09-11 NOTE — Telephone Encounter (Signed)
Met with Dr. Adele Schilder who approved a one time refill of patient's prescribed Seroque 148m and e-scribed the order to patient's W1/17/18. [phone] authorized and requested by Dr. AAdele Schilder  Patient to return for next evaluation on 10/09/16.

## 2016-10-02 ENCOUNTER — Encounter: Payer: Self-pay | Admitting: Family

## 2016-10-02 ENCOUNTER — Ambulatory Visit (INDEPENDENT_AMBULATORY_CARE_PROVIDER_SITE_OTHER): Payer: Medicare Other | Admitting: Family

## 2016-10-02 ENCOUNTER — Telehealth: Payer: Self-pay | Admitting: Family

## 2016-10-02 VITALS — BP 118/57 | HR 74 | Temp 98.4°F | Resp 16 | Ht 69.0 in | Wt 171.0 lb

## 2016-10-02 DIAGNOSIS — J019 Acute sinusitis, unspecified: Secondary | ICD-10-CM | POA: Diagnosis not present

## 2016-10-02 DIAGNOSIS — E119 Type 2 diabetes mellitus without complications: Secondary | ICD-10-CM

## 2016-10-02 DIAGNOSIS — Z1239 Encounter for other screening for malignant neoplasm of breast: Secondary | ICD-10-CM

## 2016-10-02 DIAGNOSIS — I10 Essential (primary) hypertension: Secondary | ICD-10-CM

## 2016-10-02 DIAGNOSIS — E2839 Other primary ovarian failure: Secondary | ICD-10-CM

## 2016-10-02 DIAGNOSIS — Z5181 Encounter for therapeutic drug level monitoring: Secondary | ICD-10-CM | POA: Diagnosis not present

## 2016-10-02 DIAGNOSIS — Z0000 Encounter for general adult medical examination without abnormal findings: Secondary | ICD-10-CM

## 2016-10-02 DIAGNOSIS — Z0001 Encounter for general adult medical examination with abnormal findings: Secondary | ICD-10-CM

## 2016-10-02 DIAGNOSIS — E785 Hyperlipidemia, unspecified: Secondary | ICD-10-CM

## 2016-10-02 LAB — LIPID PANEL
CHOLESTEROL: 149 mg/dL (ref 0–200)
HDL: 44.5 mg/dL (ref >39.00)
LDL CALC: 78 mg/dL (ref 0–99)
NONHDL: 104.27
Total CHOL/HDL Ratio: 3
Triglycerides: 133 mg/dL (ref 0.0–149.0)
VLDL: 26.6 mg/dL (ref 0.0–40.0)

## 2016-10-02 LAB — BASIC METABOLIC PANEL
BUN: 19 mg/dL (ref 6–23)
CO2: 23 meq/L (ref 19–32)
Calcium: 9 mg/dL (ref 8.4–10.5)
Chloride: 110 mEq/L (ref 96–112)
Creatinine, Ser: 1.05 mg/dL (ref 0.40–1.20)
GFR: 55.64 mL/min — AB (ref >60.00)
Glucose, Bld: 102 mg/dL — ABNORMAL HIGH (ref 70–99)
Potassium: 4.9 mEq/L (ref 3.5–5.1)
SODIUM: 139 meq/L (ref 135–145)

## 2016-10-02 LAB — HEMOGLOBIN A1C: HEMOGLOBIN A1C: 5.6 % (ref 4.6–6.5)

## 2016-10-02 MED ORDER — AMOXICILLIN-POT CLAVULANATE 875-125 MG PO TABS: 1.0000 | ORAL_TABLET | Freq: Two times a day (BID) | ORAL | 0 refills | Status: DC

## 2016-10-02 NOTE — Progress Notes (Signed)
Pre visit review using our clinic review tool, if applicable. No additional management support is needed unless otherwise documented below in the visit note. 

## 2016-10-02 NOTE — Progress Notes (Signed)
Subjective:    Patient ID: Kathryn Buckley, female    DOB: 1951/06/25, 66 y.o.   MRN: [id number]  HPI  Patient presents today for complete physical.  Immunizations: had flu shot at pharmacy this year Diet: reports diet needs to be improved, but trying Exercise: limited exercise Colonoscopy: declines colo, agreeable to cologuard Dexa: due Mammogram:due  Sinusitis- Reports that she has had nasal congestion for 2 weeks.  Reports + facial pain.  Notes copious nasal discharge.  She denies fever. Energy has been down.     Review of Systems  Constitutional: Negative for unexpected weight change.  HENT: Negative for hearing loss.   Eyes: Negative for visual disturbance.  Respiratory: Negative for cough.   Cardiovascular: Negative for leg swelling.  Gastrointestinal: Negative for constipation and diarrhea.  Genitourinary: Negative for dysuria and frequency.  Musculoskeletal: Negative for arthralgias and myalgias.  Neurological: Negative for headaches.  Hematological: Negative for adenopathy.  Psychiatric/Behavioral:       Denies depression/anxiety   Past Medical History:  Diagnosis Date  . COPD (chronic obstructive pulmonary disease) ([place])   . Depression   . HTN (hypertension)   . Hyperlipemia   . Seizures ([place])    Due to brain tumor that was removed in 2004  . Thyroid disease      Social History   Social History  . Marital status: Married    Spouse name: N/A  . Number of children: N/A  . Years of education: N/A   Occupational History  . Not on file.   Social History Main Topics  . Smoking status: Former Smoker    Packs/day: 2.50    Years: 40.00    Types: Cigarettes    Quit date: 03/14/2016  . Smokeless tobacco: Never Used  . Alcohol use No  . Drug use: No  . Sexual activity: Not on file   Other Topics Concern  . Not on file   Social History Narrative   Lives with husband and 2 chihuahua   She has 2 children- one son died of drug overdose   1 living  son- Kathryn Buckley- lives in [place].     2 step sons   She has worked in the past in Research officer, trade union)   She enjoys TV- likes to be home.             Past Surgical History:  Procedure Laterality Date  . BRAIN SURGERY      Family History  Problem Relation Age of Onset  . Bipolar disorder Father   . Heart disease Father   . Cancer Father     prostate  . Heart disease Mother   . Hypertension Brother   . Diabetes Brother   . Stroke Neg Hx   . Hyperlipidemia Neg Hx     Allergies  Allergen Reactions  . Lamictal [Lamotrigine] Rash  . Codeine     Other reaction(s): GI Upset (intolerance)    Current Outpatient Prescriptions on File Prior to Visit  Medication Sig Dispense Refill  . atorvastatin (LIPITOR) 40 MG tablet TAKE 1 TABLET(40 MG) BY MOUTH DAILY 90 tablet 0  . Blood Glucose Monitoring Suppl (FREESTYLE LITE) DEVI Test daily before all meals/snacks and once before bedtime.    Marland Kitchen FLUoxetine (PROZAC) 10 MG capsule Take 3 capsules (30 mg total) by mouth daily. 90 capsule 1  . glucose blood (FREESTYLE LITE) test strip Test daily before all meals/snacks and once before bedtime.    . Lancets (FREESTYLE) lancets Test  daily before all meals/snacks and once before bedtime.    Marland Kitchen levothyroxine (SYNTHROID, LEVOTHROID) 150 MCG tablet TAKE 1 TABLET BY MOUTH EVERY DAY 90 tablet 0  . lithium carbonate (LITHOBID) 300 MG CR tablet Take 2 tablets (600 mg total) by mouth at bedtime. 180 tablet 0  . losartan (COZAAR) 50 MG tablet TAKE 1 TABLET(50 MG) BY MOUTH DAILY 30 tablet 1  . metFORMIN (GLUCOPHAGE) 500 MG tablet TAKE 1 TABLET(500 MG) BY MOUTH TWICE DAILY WITH A MEAL 60 tablet 1  . nicotine (NICODERM CQ - DOSED IN MG/24 HOURS) 21 mg/24hr patch Place 1 patch (21 mg total) onto the skin daily. 28 patch 0  . omeprazole (PRILOSEC) 20 MG capsule TAKE 1 CAPSULE BY MOUTH  DAILY 90 capsule 1  . QUEtiapine (SEROQUEL) 100 MG tablet TAKE 1 TABLET(100 MG) BY MOUTH AT BEDTIME 30 tablet 0  .  albuterol (PROVENTIL) (2.5 MG/3ML) 0.083% nebulizer solution 2.5 mg. Inhale 3 ml by nebulization every 4 hours as needed for wheezing.    . fenofibrate (TRICOR) 145 MG tablet Take 1 tablet (145 mg total) by mouth daily. (Patient not taking: Reported on 10/02/2016) 90 tablet 0  . L-Methylfolate-B12-B6-B2 (METAFOLBIC) 02-21-49-5 MG TABS Take 1 tablet by mouth daily.  0   No current facility-administered medications on file prior to visit.     BP (!) 118/57 (BP Location: Left Arm, Cuff Size: Normal)   Pulse 74   Temp 98.4 F (36.9 C) (Oral)   Resp 16   Ht 5\' 9"  (1.753 m)   Wt 171 lb (77.6 kg)   LMP 09/23/1988   SpO2 97% Comment: room air  BMI 25.25 kg/m       Objective:   Physical Exam  Physical Exam  Constitutional: She is oriented to person, place, and time. She appears well-developed and well-nourished. No distress.  HENT:  Head: Normocephalic and atraumatic.  Right Ear: Tympanic membrane and ear canal normal.  Left Ear: Tympanic membrane and ear canal normal.  Mouth/Throat: Oropharynx is clear and moist.  Eyes: Pupils are equal, round, and reactive to light. No scleral icterus.  Neck: Normal range of motion. No thyromegaly present.  Cardiovascular: Normal rate and regular rhythm.   No murmur heard. Pulmonary/Chest: Effort normal and breath sounds normal. No respiratory distress. He has no wheezes. She has no rales. She exhibits no tenderness.  Abdominal: Soft. Bowel sounds are normal. She exhibits no distension and no mass. There is no tenderness. There is no rebound and no guarding.  Musculoskeletal: She exhibits no edema.  Lymphadenopathy:    She has no cervical adenopathy.  Neurological: She is alert and oriented to person, place, and time. She has normal patellar reflexes. She exhibits normal muscle tone. Coordination normal.  Skin: Skin is warm and dry.  Psychiatric: She has a normal mood and affect. Her behavior is normal. Judgment and thought content normal.  Breasts:  Examined lying Right: Without masses, retractions, discharge or axillary adenopathy.  Left: Without masses, retractions, discharge or axillary adenopathy.  Pelvic: deferred         Assessment & Plan:         Assessment & Plan:  Preventative Care- deiscussed healthy diet, exercise, weight loss. Refer for mammogram, dexa and  and cologuard. Obtain routine labs. ( requests lithium level for her psychiatrist, dr. Adele Schilder).  Sinusitis- new. Will rx with augmentin. Pt is advised to call if new/worsening symptoms or if symptoms are not improved in 1 week.

## 2016-10-02 NOTE — Telephone Encounter (Signed)
Pt would like to proceed with cologuard testing please.

## 2016-10-02 NOTE — Patient Instructions (Signed)
Continue to work on [employer], exercise and weight loss. Complete lab work prior to leaving.

## 2016-10-03 LAB — LITHIUM LEVEL: LITHIUM LVL: 0.9 mmol/L (ref 0.6–1.2)

## 2016-10-07 NOTE — Telephone Encounter (Signed)
Cologuard order submitted. Letter mailed to pt.

## 2016-10-09 ENCOUNTER — Ambulatory Visit (HOSPITAL_COMMUNITY): Payer: Self-pay | Admitting: Psychiatry

## 2016-10-12 ENCOUNTER — Other Ambulatory Visit: Payer: Self-pay | Admitting: Family

## 2016-10-12 ENCOUNTER — Other Ambulatory Visit (HOSPITAL_COMMUNITY): Payer: Self-pay | Admitting: Psychiatry

## 2016-10-12 DIAGNOSIS — J449 Chronic obstructive pulmonary disease, unspecified: Secondary | ICD-10-CM | POA: Diagnosis not present

## 2016-10-12 DIAGNOSIS — F3131 Bipolar disorder, current episode depressed, mild: Secondary | ICD-10-CM

## 2016-10-14 ENCOUNTER — Other Ambulatory Visit (HOSPITAL_COMMUNITY): Payer: Self-pay | Admitting: Psychiatry

## 2016-10-14 DIAGNOSIS — F3131 Bipolar disorder, current episode depressed, mild: Secondary | ICD-10-CM

## 2016-10-14 MED ORDER — LITHIUM CARBONATE ER 300 MG PO TBCR: 600.0000 mg | EXTENDED_RELEASE_TABLET | Freq: Every day | ORAL | 0 refills | Status: DC

## 2016-10-14 MED ORDER — FLUOXETINE HCL 10 MG PO CAPS: 30.0000 mg | ORAL_CAPSULE | Freq: Every day | ORAL | 0 refills | Status: DC

## 2016-10-14 MED ORDER — QUETIAPINE FUMARATE 100 MG PO TABS: ORAL_TABLET | ORAL | 0 refills | Status: DC

## 2016-10-16 ENCOUNTER — Telehealth (HOSPITAL_COMMUNITY): Payer: Self-pay

## 2016-10-16 ENCOUNTER — Other Ambulatory Visit (HOSPITAL_COMMUNITY): Payer: Self-pay | Admitting: Psychiatry

## 2016-10-16 ENCOUNTER — Telehealth: Payer: Self-pay | Admitting: *Deleted

## 2016-10-16 DIAGNOSIS — J449 Chronic obstructive pulmonary disease, unspecified: Secondary | ICD-10-CM

## 2016-10-16 DIAGNOSIS — R9389 Abnormal findings on diagnostic imaging of other specified body structures: Secondary | ICD-10-CM

## 2016-10-16 DIAGNOSIS — F3131 Bipolar disorder, current episode depressed, mild: Secondary | ICD-10-CM

## 2016-10-16 MED ORDER — QUETIAPINE FUMARATE 100 MG PO TABS: ORAL_TABLET | ORAL | 0 refills | Status: DC

## 2016-10-16 MED ORDER — LITHIUM CARBONATE ER 300 MG PO TBCR: 600.0000 mg | EXTENDED_RELEASE_TABLET | Freq: Every day | ORAL | 0 refills | Status: DC

## 2016-10-16 MED ORDER — FLUOXETINE HCL 10 MG PO CAPS: 30.0000 mg | ORAL_CAPSULE | Freq: Every day | ORAL | 0 refills | Status: DC

## 2016-10-16 NOTE — Telephone Encounter (Signed)
Medication refill requests- Telephone call with patient after she left a message we had not responded to her pharmacy's request for refills. Informed new orders sent to [facility] 10/14/16 for her Lithium, Prozac and Seroquel.  Patient upset and states these were sent to the wrong pharmacy.  Requests orders be sent to [facility] in [place].  Agreed to request from Dr. Adele Schilder to send new orders to this pharmacy and to cancel other orders.

## 2016-10-16 NOTE — Telephone Encounter (Signed)
Pt requests that we get CT result from Dr Camillo Flaming with [facility]. Pt signed release on 10/02/16 for all records from Dr Camillo Flaming. Release faxed to [phone]. Awaiting records.

## 2016-10-16 NOTE — Telephone Encounter (Signed)
Prescription called in to [facility]

## 2016-10-16 NOTE — Telephone Encounter (Signed)
Medication management - Called and left patient a message that Dr. Adele Schilder had recent needed orders to her [facility].  Requested patient call back if any problems getting refills.

## 2016-10-17 ENCOUNTER — Telehealth: Payer: Self-pay | Admitting: Family

## 2016-10-17 NOTE — Telephone Encounter (Signed)
lvm advising patient to schedule medicare wellness appointment with health coach and follow up with PCP.

## 2016-10-23 DIAGNOSIS — Z1211 Encounter for screening for malignant neoplasm of colon: Secondary | ICD-10-CM | POA: Diagnosis not present

## 2016-10-23 DIAGNOSIS — Z1212 Encounter for screening for malignant neoplasm of rectum: Secondary | ICD-10-CM | POA: Diagnosis not present

## 2016-10-29 ENCOUNTER — Encounter (HOSPITAL_COMMUNITY): Payer: Self-pay | Admitting: Psychiatry

## 2016-10-29 ENCOUNTER — Ambulatory Visit (INDEPENDENT_AMBULATORY_CARE_PROVIDER_SITE_OTHER): Payer: Medicare Other | Admitting: Psychiatry

## 2016-10-29 DIAGNOSIS — Z79899 Other long term (current) drug therapy: Secondary | ICD-10-CM

## 2016-10-29 DIAGNOSIS — Z833 Family history of diabetes mellitus: Secondary | ICD-10-CM

## 2016-10-29 DIAGNOSIS — Z8249 Family history of ischemic heart disease and other diseases of the circulatory system: Secondary | ICD-10-CM

## 2016-10-29 DIAGNOSIS — Z888 Allergy status to other drugs, medicaments and biological substances status: Secondary | ICD-10-CM

## 2016-10-29 DIAGNOSIS — F3131 Bipolar disorder, current episode depressed, mild: Secondary | ICD-10-CM | POA: Diagnosis not present

## 2016-10-29 DIAGNOSIS — Z818 Family history of other mental and behavioral disorders: Secondary | ICD-10-CM

## 2016-10-29 DIAGNOSIS — F17210 Nicotine dependence, cigarettes, uncomplicated: Secondary | ICD-10-CM

## 2016-10-29 DIAGNOSIS — Z8042 Family history of malignant neoplasm of prostate: Secondary | ICD-10-CM | POA: Diagnosis not present

## 2016-10-29 MED ORDER — LITHIUM CARBONATE ER 300 MG PO TBCR: 600.0000 mg | EXTENDED_RELEASE_TABLET | Freq: Every day | ORAL | 0 refills | Status: DC

## 2016-10-29 MED ORDER — FLUOXETINE HCL 10 MG PO CAPS: 30.0000 mg | ORAL_CAPSULE | Freq: Every day | ORAL | 2 refills | Status: DC

## 2016-10-29 MED ORDER — QUETIAPINE FUMARATE 200 MG PO TABS: ORAL_TABLET | ORAL | 0 refills | Status: DC

## 2016-10-29 NOTE — Progress Notes (Signed)
[employer] MD/PA/NP OP Progress Note  10/29/2016 10:16 AM Kathryn Buckley  MRN:  [id number]  Chief Complaint:  Chief Complaint    Follow-up     Subjective:  I'm not sleeping good.  I have a lot of mood swings.  HPI: Kathryn Buckley came for her follow-up appointment.  She is taking Seroquel 100 mg at night.  She sleeping only a few hours.  She admitted irritability, frustration and easily angry.  She used to take 400 Seroquel however she was noncompliant and we restarted 100 mg at bedtime.  She does not want to take the higher dose but now she feel she may need higher doses of Seroquel.  Though she denies any suicidal thoughts or homicidal thoughts but admitted easily frustrated.  Overall she lost weight because some time she does not have any appetite.  However her energy level is good.  She had blood work and her lithium level was 0.9.  Her hemoglobin A1c is 5.6 which is better than 1 year ago.  Patient denies any hallucination, paranoia, panic attacks or any OCD symptoms.  She does not have any dizziness.  She recently finished antibiotic and feeling better physically.  Patient denies drinking alcohol or using any illegal substances.  Visit Diagnosis:    ICD-9-CM ICD-10-CM   1. Bipolar affective disorder, depressed, mild (HCC) 296.51 F31.31 FLUoxetine (PROZAC) 10 MG capsule     lithium carbonate (LITHOBID) 300 MG CR tablet     QUEtiapine (SEROQUEL) 200 MG tablet    Past Psychiatric History: Patient has history of bipolar disorder.  She started getting treatment in this office since 2008.  In the past she had tried Lexapro, Cymbalta, Paxil, Zoloft and Geodon.  Patient denies any history of suicidal attempt.  We have tried Celexa which worked very well but it was stopped due to interaction with Nexium.  Past Medical History:  Past Medical History:  Diagnosis Date  . COPD (chronic obstructive pulmonary disease) ([place])   . Depression   . HTN (hypertension)   . Hyperlipemia   . Seizures ([place])    Due to  brain tumor that was removed in 2004  . Thyroid disease     Past Surgical History:  Procedure Laterality Date  . BRAIN SURGERY      Family Psychiatric History: Reviewed.  Family History:  Family History  Problem Relation Age of Onset  . Bipolar disorder Father   . Heart disease Father   . Cancer Father     prostate  . Heart disease Mother   . Hypertension Brother   . Diabetes Brother   . Stroke Neg Hx   . Hyperlipidemia Neg Hx     Social History:  Social History   Social History  . Marital status: Married    Spouse name: N/A  . Number of children: N/A  . Years of education: N/A   Social History Main Topics  . Smoking status: Current Every Day Smoker    Packs/day: 2.50    Years: 40.00    Types: Cigarettes    Last attempt to quit: 03/14/2016  . Smokeless tobacco: Never Used     Comment: States interest in Chantix. Trying to quit.   . Alcohol use No     Comment: Occasional beer  . Drug use: No  . Sexual activity: Not Currently   Other Topics Concern  . None   Social History Narrative   Lives with husband and 2 chihuahua   She has 2 children- one son died  of drug overdose   1 living son- Kathryn Buckley- lives in [place].     2 step sons   She has worked in the past in Research officer, trade union)   She enjoys TV- likes to be home.             Allergies:  Allergies  Allergen Reactions  . Lamictal [Lamotrigine] Rash  . Codeine     Other reaction(s): GI Upset (intolerance)    Recent Results (from the past 2160 hour(s))  Lithium level     Status: None   Collection Time: 10/02/16 11:11 AM  Result Value Ref Range   Lithium Lvl 0.9 0.6 - 1.2 mmol/L    Comment: ** Please note change in unit of measure and reference range(s). **  Lipid panel     Status: None   Collection Time: 10/02/16 11:11 AM  Result Value Ref Range   Cholesterol 149 0 - 200 mg/dL    Comment: ATP III Classification       Desirable:  < 200 mg/dL               Borderline High:  200 - 239  mg/dL          High:  > = 240 mg/dL   Triglycerides 133.0 0.0 - 149.0 mg/dL    Comment: Normal:  <150 mg/dLBorderline High:  150 - 199 mg/dL   HDL 44.50 >39.00 mg/dL   VLDL 26.6 0.0 - 40.0 mg/dL   LDL Cholesterol 78 0 - 99 mg/dL   Total CHOL/HDL Ratio 3     Comment:                Men          Women1/2 Average Risk     3.4          3.3Average Risk          5.0          4.42X Average Risk          9.6          7.13X Average Risk          15.0          11.0                       NonHDL 104.27     Comment: NOTE:  Non-HDL goal should be 30 mg/dL higher than patient's LDL goal (i.e. LDL goal of < 70 mg/dL, would have non-HDL goal of < 100 mg/dL)  Hemoglobin A1c     Status: None   Collection Time: 10/02/16 11:11 AM  Result Value Ref Range   Hgb A1c MFr Bld 5.6 4.6 - 6.5 %    Comment: Glycemic Control Guidelines for People with Diabetes:Non Diabetic:  <6%Goal of Therapy: <7%Additional Action Suggested:  >3%   Basic metabolic panel     Status: Abnormal   Collection Time: 10/02/16 11:11 AM  Result Value Ref Range   Sodium 139 135 - 145 mEq/L   Potassium 4.9 3.5 - 5.1 mEq/L   Chloride 110 96 - 112 mEq/L   CO2 23 19 - 32 mEq/L   Glucose, Bld 102 (H) 70 - 99 mg/dL   BUN 19 6 - 23 mg/dL   Creatinine, Ser 1.05 0.40 - 1.20 mg/dL   Calcium 9.0 8.4 - 10.5 mg/dL   GFR 55.64 (L) >35.45 mL/min  Metabolic Disorder Labs: Lab Results  Component Value Date   HGBA1C  5.6 10/02/2016   MPG 128 (H) 06/29/2014   MPG 134 (H) 09/02/2012   No results found for: PROLACTIN Lab Results  Component Value Date   CHOL 149 10/02/2016   TRIG 133.0 10/02/2016   HDL 44.50 10/02/2016   CHOLHDL 3 10/02/2016   VLDL 26.6 10/02/2016   LDLCALC 78 10/02/2016   LDLCALC 157 (H) 08/07/2015     Current Medications: Current Outpatient Prescriptions  Medication Sig Dispense Refill  . albuterol (PROVENTIL) (2.5 MG/3ML) 0.083% nebulizer solution 2.5 mg. Inhale 3 ml by nebulization every 4 hours as needed for wheezing.    Marland Kitchen  atorvastatin (LIPITOR) 40 MG tablet TAKE 1 TABLET(40 MG) BY MOUTH DAILY 90 tablet 0  . Blood Glucose Monitoring Suppl (FREESTYLE LITE) DEVI Test daily before all meals/snacks and once before bedtime.    . fenofibrate (TRICOR) 145 MG tablet Take 1 tablet (145 mg total) by mouth daily. 90 tablet 0  . FLUoxetine (PROZAC) 10 MG capsule Take 3 capsules (30 mg total) by mouth daily. 90 capsule 2  . glucose blood (FREESTYLE LITE) test strip Test daily before all meals/snacks and once before bedtime.    Marland Kitchen L-Methylfolate-B12-B6-B2 (METAFOLBIC) 02-21-49-5 MG TABS Take 1 tablet by mouth daily.  0  . Lancets (FREESTYLE) lancets Test daily before all meals/snacks and once before bedtime.    Marland Kitchen levothyroxine (SYNTHROID, LEVOTHROID) 150 MCG tablet TAKE 1 TABLET BY MOUTH EVERY DAY 90 tablet 0  . lithium carbonate (LITHOBID) 300 MG CR tablet Take 2 tablets (600 mg total) by mouth at bedtime. 180 tablet 0  . losartan (COZAAR) 50 MG tablet TAKE 1 TABLET(50 MG) BY MOUTH DAILY 30 tablet 5  . metFORMIN (GLUCOPHAGE) 500 MG tablet TAKE 1 TABLET(500 MG) BY MOUTH TWICE DAILY WITH A MEAL 60 tablet 1  . omeprazole (PRILOSEC) 20 MG capsule TAKE 1 CAPSULE BY MOUTH  DAILY 90 capsule 1  . QUEtiapine (SEROQUEL) 200 MG tablet TAKE 1 TABLET(100 MG) BY MOUTH AT BEDTIME 90 tablet 0   No current facility-administered medications for this visit.     Neurologic: Headache: No Seizure: No Paresthesias: No  Musculoskeletal: Strength & Muscle Tone: within normal limits Gait & Station: normal Patient leans: N/A  Psychiatric Specialty Exam: Review of Systems  Constitutional: Positive for weight loss.  HENT: Negative.   Eyes: Negative.   Respiratory: Negative.   Cardiovascular: Negative.   Musculoskeletal: Negative.   Skin: Negative.   Neurological: Negative for tremors.  Psychiatric/Behavioral: The patient has insomnia.     Blood pressure 140/88, pulse 73, height 5' 9.5" (1.765 m), weight 168 lb (76.2 kg), last menstrual  period 09/23/1988.Body mass index is 24.45 kg/m.  General Appearance: Casual  Eye Contact:  Fair  Speech:  Clear and Coherent  Volume:  Normal  Mood:  Irritable  Affect:  Constricted  Thought Process:  Goal Directed  Orientation:  Full (Time, Place, and Person)  Thought Content: WDL and Logical   Suicidal Thoughts:  No  Homicidal Thoughts:  No  Memory:  Immediate;   Fair Recent;   Fair Remote;   Fair  Judgement:  Fair  Insight:  Good  Psychomotor Activity:  Normal  Concentration:  Concentration: Fair and Attention Span: Fair  Recall:  Good  Fund of Knowledge: Good  Language: Good  Akathisia:  No  Handed:  Right  AIMS (if indicated):  0  Assets:  Communication Skills Desire for Improvement Housing Resilience  ADL's:  Intact  Cognition: WNL  Sleep:  fair   Assessment: Bipolar  disorder NOS  Plan: I review her current medication, blood work results and psychosocial stressors.  Patient continues to have mood lability and irritability.  She used to take higher dose of Seroquel.  Recommended to increase Seroquel 200 mg at bedtime.  Continue Prozac 30 mg daily.  She has no tremors shakes or any EPS.  Her hemoglobin A1c is improved from the past.  Her lithium level is 0.9.  Discussed medication side effects and benefits.  Recommended counseling but patient declined.  Discuss safety plan that anytime having active suicidal thoughts or homicidal thoughts and she need to call 911 or go to the local emergency room.  Follow-up in 3 months.    Churchill Grimsley T., MD 10/29/2016, 10:16 AM

## 2016-10-31 ENCOUNTER — Telehealth: Payer: Self-pay | Admitting: Family

## 2016-10-31 ENCOUNTER — Other Ambulatory Visit: Payer: Self-pay | Admitting: Family

## 2016-10-31 LAB — COLOGUARD: COLOGUARD: POSITIVE

## 2016-10-31 NOTE — Telephone Encounter (Signed)
Patient called requesting a call back regarding her lab results. Please advise  Phone: [phone]

## 2016-11-01 ENCOUNTER — Telehealth: Payer: Self-pay | Admitting: Family

## 2016-11-01 ENCOUNTER — Other Ambulatory Visit: Payer: Self-pay | Admitting: Family Medicine

## 2016-11-01 MED ORDER — LEVOTHYROXINE SODIUM 150 MCG PO TABS: 150.0000 ug | ORAL_TABLET | Freq: Every day | ORAL | 1 refills | Status: DC

## 2016-11-01 NOTE — Addendum Note (Signed)
Addended by: Debbrah Alar on: 11/01/2016 03:25 PM   Modules accepted: Orders

## 2016-11-01 NOTE — Telephone Encounter (Signed)
  Sugar looks great. Cholesterol is improved. Lithium level is therapeutic.  We discussed these results with her back in January.

## 2016-11-01 NOTE — Telephone Encounter (Signed)
Notified pt and she voices understanding. 

## 2016-11-01 NOTE — Telephone Encounter (Signed)
Notified pt. She states she does not want to go back to Dr Camillo Flaming since he is with [facility] and she is wanting a referral to pulmonologist with [facility].  Please advise?

## 2016-11-01 NOTE — Telephone Encounter (Signed)
Spoke with pt. She was calling about CT result from Dr Camillo Flaming. See previous phone note of 10/16/16.

## 2016-11-01 NOTE — Telephone Encounter (Signed)
CT from 06/2016 is now available in [place]. Please review and advise?

## 2016-11-01 NOTE — Telephone Encounter (Signed)
I will place referral to one of  Our pulmonologists. In the meantime, I will order follow up CT chest.

## 2016-11-01 NOTE — Telephone Encounter (Signed)
Melissa-- please see labs from January and advise?

## 2016-11-01 NOTE — Telephone Encounter (Signed)
I reviewed the CT scan of her chest from the end of October. It looks like a follow up CT scan was recommended to ensure resolution of the changes noted. Does she have follow up with Dr. Lonzo Candy? If not, I would recommend that she follow up with him for further recommendations.

## 2016-11-01 NOTE — Telephone Encounter (Signed)
Relation to LJ:QGBE Call back [facility], [place] - [address] AT [place] [phone] (Phone) [phone] (Fax)     Reason for call:  Patient requesting a refill atorvastatin (LIPITOR) 40 MG tablet and would like the [facility] removed from pharmacy list.

## 2016-11-01 NOTE — Telephone Encounter (Signed)
Left message for pt to return my call.

## 2016-11-01 NOTE — Telephone Encounter (Signed)
Atorvastatin refill was sent to [facility] before lunch today. I have now removed [facility] from her contact list. Notified pt. She is also requesting refill of levothyroxine. Refill sent.

## 2016-11-04 ENCOUNTER — Encounter: Payer: Self-pay | Admitting: Family

## 2016-11-04 ENCOUNTER — Telehealth: Payer: Self-pay | Admitting: *Deleted

## 2016-11-04 DIAGNOSIS — R195 Other fecal abnormalities: Secondary | ICD-10-CM

## 2016-11-04 NOTE — Telephone Encounter (Signed)
Received result via [employer] that cologuard is positive. Result abstracted.  Please advise?

## 2016-11-04 NOTE — Telephone Encounter (Signed)
Please notify pt.  Placed referral for GI.

## 2016-11-04 NOTE — Telephone Encounter (Signed)
Notified pt and she voices understanding. 

## 2016-11-12 DIAGNOSIS — J449 Chronic obstructive pulmonary disease, unspecified: Secondary | ICD-10-CM | POA: Diagnosis not present

## 2016-12-10 DIAGNOSIS — J449 Chronic obstructive pulmonary disease, unspecified: Secondary | ICD-10-CM | POA: Diagnosis not present

## 2016-12-20 ENCOUNTER — Other Ambulatory Visit (HOSPITAL_COMMUNITY): Payer: Self-pay | Admitting: Psychiatry

## 2016-12-20 DIAGNOSIS — F3131 Bipolar disorder, current episode depressed, mild: Secondary | ICD-10-CM

## 2016-12-30 ENCOUNTER — Telehealth: Payer: Self-pay | Admitting: Family

## 2016-12-30 MED ORDER — ONETOUCH ULTRASOFT LANCETS MISC: 1 refills | Status: DC

## 2016-12-30 MED ORDER — GLUCOSE BLOOD VI STRP: ORAL_STRIP | 1 refills | Status: DC

## 2016-12-30 NOTE — Telephone Encounter (Signed)
Rxs sent and pt has been notified.

## 2016-12-30 NOTE — Telephone Encounter (Signed)
Caller name: Anjelika Ausburn Relationship to patient: self Can be reached: [phone] Pharmacy: [facility], [place] - [address] AT [place]  Reason for call: Pt needing diabetic testing supplies. She is needing strips and lancets ordered for a One Touch Ultra II meter. She is testing 1x day.

## 2017-01-10 DIAGNOSIS — J449 Chronic obstructive pulmonary disease, unspecified: Secondary | ICD-10-CM | POA: Diagnosis not present

## 2017-01-23 ENCOUNTER — Ambulatory Visit: Payer: Self-pay | Admitting: Pulmonary Disease

## 2017-01-24 ENCOUNTER — Other Ambulatory Visit: Payer: Self-pay | Admitting: Medical

## 2017-01-28 ENCOUNTER — Ambulatory Visit (HOSPITAL_COMMUNITY): Payer: Self-pay | Admitting: Psychiatry

## 2017-01-31 ENCOUNTER — Other Ambulatory Visit (HOSPITAL_COMMUNITY): Payer: Self-pay | Admitting: Psychiatry

## 2017-01-31 DIAGNOSIS — F3131 Bipolar disorder, current episode depressed, mild: Secondary | ICD-10-CM

## 2017-02-02 ENCOUNTER — Other Ambulatory Visit (HOSPITAL_COMMUNITY): Payer: Self-pay | Admitting: Psychiatry

## 2017-02-02 DIAGNOSIS — F3131 Bipolar disorder, current episode depressed, mild: Secondary | ICD-10-CM

## 2017-02-04 ENCOUNTER — Encounter (HOSPITAL_COMMUNITY): Payer: Self-pay | Admitting: Psychiatry

## 2017-02-04 ENCOUNTER — Ambulatory Visit (INDEPENDENT_AMBULATORY_CARE_PROVIDER_SITE_OTHER): Payer: Medicare Other | Admitting: Psychiatry

## 2017-02-04 DIAGNOSIS — Z79899 Other long term (current) drug therapy: Secondary | ICD-10-CM

## 2017-02-04 DIAGNOSIS — Z818 Family history of other mental and behavioral disorders: Secondary | ICD-10-CM | POA: Diagnosis not present

## 2017-02-04 DIAGNOSIS — F17210 Nicotine dependence, cigarettes, uncomplicated: Secondary | ICD-10-CM | POA: Diagnosis not present

## 2017-02-04 DIAGNOSIS — F3131 Bipolar disorder, current episode depressed, mild: Secondary | ICD-10-CM | POA: Diagnosis not present

## 2017-02-04 MED ORDER — FLUOXETINE HCL 40 MG PO CAPS: 40.0000 mg | ORAL_CAPSULE | Freq: Every day | ORAL | 0 refills | Status: DC

## 2017-02-04 MED ORDER — QUETIAPINE FUMARATE 200 MG PO TABS: ORAL_TABLET | ORAL | 0 refills | Status: DC

## 2017-02-04 MED ORDER — LITHIUM CARBONATE ER 300 MG PO TBCR: 600.0000 mg | EXTENDED_RELEASE_TABLET | Freq: Every day | ORAL | 0 refills | Status: DC

## 2017-02-04 NOTE — Progress Notes (Signed)
BH MD/PA/NP OP Progress Note  02/04/2017 2:12 PM Kathryn Buckley  MRN:  [id number]  Chief Complaint:  Subjective:  I am sleeping better with Seroquel.  I still have no motivation to do things.  HPI: Patient came for her follow-up appointment.  She is sleeping better with increase Seroquel.  She denies any irritability or any anger but still have lack of motivation to do things.  She has no desire to leave her house unless it is important.  She stays most of the time in her bed.  She does not drive and her brother takes her for the medical doctor appointment.  She is taking the medication and reported no side effects.  She denies any suicidal thoughts or homicidal thought but sometime feeling lack of energy.  Her appetite is okay.  Her vital signs are stable.  Her blood sugar is stable.  She is checking her blood sugar regularly.  Patient denies any paranoia or any hallucination.  She has no tremors or any shakes.  Patient denies drinking alcohol or using any illegal substances.  She endorse much improvement physically because last year she was very sick but is still some time endorse mentally drained.  Visit Diagnosis:    ICD-9-CM ICD-10-CM   1. Bipolar affective disorder, depressed, mild (HCC) 296.51 F31.31 QUEtiapine (SEROQUEL) 200 MG tablet     lithium carbonate (LITHOBID) 300 MG CR tablet     FLUoxetine (PROZAC) 40 MG capsule    Past Psychiatric History:  Patient has history of bipolar disorder.  She started getting treatment in this office since 2008.  In the past she had tried Lexapro, Cymbalta, Paxil, Zoloft and Geodon.  Patient denies any history of suicidal attempt.  We have tried Celexa which worked very well but it was stopped due to interaction with Nexium.  Past Medical History:  Past Medical History:  Diagnosis Date  . COPD (chronic obstructive pulmonary disease) ([place])   . Depression   . HTN (hypertension)   . Hyperlipemia   . Seizures ([place])    Due to brain tumor that was  removed in 2004  . Thyroid disease     Past Surgical History:  Procedure Laterality Date  . BRAIN SURGERY      Family Psychiatric History: Reviewed.  Family History:  Family History  Problem Relation Age of Onset  . Bipolar disorder Father   . Heart disease Father   . Cancer Father        prostate  . Heart disease Mother   . Hypertension Brother   . Diabetes Brother   . Stroke Neg Hx   . Hyperlipidemia Neg Hx     Social History:  Social History   Social History  . Marital status: Married    Spouse name: N/A  . Number of children: N/A  . Years of education: N/A   Social History Main Topics  . Smoking status: Current Every Day Smoker    Packs/day: 2.50    Years: 40.00    Types: Cigarettes    Last attempt to quit: 03/14/2016  . Smokeless tobacco: Never Used     Comment: States interest in Chantix. Trying to quit.   . Alcohol use No     Comment: Occasional beer  . Drug use: No  . Sexual activity: Not Currently   Other Topics Concern  . None   Social History Narrative   Lives with husband and 2 chihuahua   She has 2 children- one son died of drug  overdose   1 living son- Kathryn Buckley- lives in [place].     2 step sons   She has worked in the past in Research officer, trade union)   She enjoys TV- likes to be home.             Allergies:  Allergies  Allergen Reactions  . Lamictal [Lamotrigine] Rash  . Codeine     Other reaction(s): GI Upset (intolerance)    Metabolic Disorder Labs: Lab Results  Component Value Date   HGBA1C 5.6 10/02/2016   MPG 128 (H) 06/29/2014   MPG 134 (H) 09/02/2012   No results found for: PROLACTIN Lab Results  Component Value Date   CHOL 149 10/02/2016   TRIG 133.0 10/02/2016   HDL 44.50 10/02/2016   CHOLHDL 3 10/02/2016   VLDL 26.6 10/02/2016   LDLCALC 78 10/02/2016   LDLCALC 157 (H) 08/07/2015     Current Medications: Current Outpatient Prescriptions  Medication Sig Dispense Refill  . atorvastatin (LIPITOR) 40  MG tablet TAKE 1 TABLET(40 MG) BY MOUTH DAILY 90 tablet 1  . Blood Glucose Monitoring Suppl (FREESTYLE LITE) DEVI Test daily before all meals/snacks and once before bedtime.    Marland Kitchen FLUoxetine (PROZAC) 10 MG capsule Take 3 capsules (30 mg total) by mouth daily. 90 capsule 2  . glucose blood (ONE TOUCH ULTRA TEST) test strip Use as instructed to check blood sugar once a day. 100 each 1  . Lancets (ONETOUCH ULTRASOFT) lancets Use as instructed to check blood sugar once a day. 100 each 1  . levothyroxine (SYNTHROID, LEVOTHROID) 150 MCG tablet Take 1 tablet (150 mcg total) by mouth daily. 90 tablet 1  . lithium carbonate (LITHOBID) 300 MG CR tablet Take 2 tablets (600 mg total) by mouth at bedtime. 180 tablet 0  . losartan (COZAAR) 50 MG tablet TAKE 1 TABLET(50 MG) BY MOUTH DAILY 30 tablet 5  . metFORMIN (GLUCOPHAGE) 500 MG tablet TAKE 1 TABLET(500 MG) BY MOUTH TWICE DAILY WITH A MEAL 60 tablet 3  . omeprazole (PRILOSEC) 20 MG capsule TAKE 1 CAPSULE BY MOUTH  DAILY 90 capsule 0  . QUEtiapine (SEROQUEL) 200 MG tablet TAKE 1 TABLET(100 MG) BY MOUTH AT BEDTIME 90 tablet 0   No current facility-administered medications for this visit.     Neurologic: Headache: No Seizure: No Paresthesias: No  Musculoskeletal: Strength & Muscle Tone: within normal limits Gait & Station: normal Patient leans: N/A  Psychiatric Specialty Exam: Review of Systems  Constitutional: Negative.   HENT: Negative.   Musculoskeletal: Negative.   Skin: Negative.   Neurological: Negative.     Blood pressure 128/80, pulse 79, height 5\' 9"  (1.753 m), weight 166 lb 6.4 oz (75.5 kg), last menstrual period 09/23/1988.Body mass index is 24.57 kg/m.  General Appearance: Casual  Eye Contact:  Good  Speech:  Slow  Volume:  Normal  Mood:  Euthymic  Affect:  Appropriate  Thought Process:  Goal Directed  Orientation:  Full (Time, Place, and Person)  Thought Content: WDL and Logical   Suicidal Thoughts:  No  Homicidal Thoughts:   No  Memory:  Immediate;   Good Recent;   Good Remote;   Good  Judgement:  Good  Insight:  Good  Psychomotor Activity:  Normal  Concentration:  Concentration: Good and Attention Span: Good  Recall:  Good  Fund of Knowledge: Good  Language: Good  Akathisia:  No  Handed:  Right  AIMS (if indicated):  0  Assets:  Communication Skills Desire for Improvement  Housing Physical Health Resilience Social Support  ADL's:  Intact  Cognition: WNL  Sleep:  Improved    Assessment: Bipolar disorder type I  Plan: Encourage to walk at least 10-15 minutes every day, recommended to read books as she enjoys reading books.  Discuss boredom and lack of activity.  Patient acknowledged and promised that she will start reading books and gardening.  I recommended to try increasing Prozac to help residual depression.  She is tolerating medication without any side effects.  Continue Seroquel 200 mg at bedtime but is helping her insomnia and racing thoughts.  Continue lithium 600 mg at bedtime.  I offer counseling but patient declined.  Recommended to call us back if she has any question, concern or if she feels worsening of the symptom.  Follow-up in 3 months.  Suvan Stcyr T., MD 02/04/2017, 2:12 PM

## 2017-02-09 DIAGNOSIS — J449 Chronic obstructive pulmonary disease, unspecified: Secondary | ICD-10-CM | POA: Diagnosis not present

## 2017-03-10 ENCOUNTER — Encounter: Payer: Self-pay | Admitting: Family

## 2017-03-12 DIAGNOSIS — J449 Chronic obstructive pulmonary disease, unspecified: Secondary | ICD-10-CM | POA: Diagnosis not present

## 2017-03-22 ENCOUNTER — Telehealth: Payer: Self-pay | Admitting: Family

## 2017-03-24 NOTE — Telephone Encounter (Signed)
2 week supply of metformin was sent to pharmacy. Pt was due for follow up on 01/30/17 and is past due. Please call pt to schedule appt with Melissa as soon as possible. Thanks!

## 2017-03-25 NOTE — Telephone Encounter (Signed)
Please try pt again tomorrow as we only gave her a 2 week supply and I hope she can schedule a f/u before that runs out. Thanks!

## 2017-03-25 NOTE — Telephone Encounter (Signed)
lvm for pt advising to schedule F/U appt.

## 2017-03-27 NOTE — Telephone Encounter (Signed)
Pt returned call. She said that she have a hard time getting transportation to come in to the office. She said that she will work on transportation and give [place] a call back. Expressed to pt that she would need to be seen within 2 weeks. Pt expressed understanding.

## 2017-04-11 DIAGNOSIS — J449 Chronic obstructive pulmonary disease, unspecified: Secondary | ICD-10-CM | POA: Diagnosis not present

## 2017-04-17 ENCOUNTER — Telehealth (HOSPITAL_COMMUNITY): Payer: Self-pay

## 2017-04-17 ENCOUNTER — Other Ambulatory Visit: Payer: Self-pay | Admitting: Emergency Medicine

## 2017-04-17 ENCOUNTER — Telehealth: Payer: Self-pay | Admitting: Family

## 2017-04-17 DIAGNOSIS — F3131 Bipolar disorder, current episode depressed, mild: Secondary | ICD-10-CM

## 2017-04-17 MED ORDER — LITHIUM CARBONATE ER 300 MG PO TBCR: 600.0000 mg | EXTENDED_RELEASE_TABLET | Freq: Every day | ORAL | 0 refills | Status: DC

## 2017-04-17 MED ORDER — QUETIAPINE FUMARATE 200 MG PO TABS: ORAL_TABLET | ORAL | 0 refills | Status: DC

## 2017-04-17 MED ORDER — FLUOXETINE HCL 40 MG PO CAPS: 40.0000 mg | ORAL_CAPSULE | Freq: Every day | ORAL | 0 refills | Status: DC

## 2017-04-17 MED ORDER — METFORMIN HCL 500 MG PO TABS: ORAL_TABLET | ORAL | 0 refills | Status: DC

## 2017-04-17 NOTE — Telephone Encounter (Signed)
Caller name: Millenia Waldvogel  Relationship to patient: self Can be reached: [phone] Pharmacy: [facility], [place] - [address] AT [address]  Reason for call:  Pt has 2 1/2 days of metformin left. She has appt scheduled for 04/30/17. Requesting enough meds to get to her appt. Ok to send in on Friday.

## 2017-04-17 NOTE — Telephone Encounter (Signed)
Refill sent.

## 2017-04-17 NOTE — Telephone Encounter (Signed)
Patient has a follow up appointment with you on 8/29, however she will be out of medication on 8/15. She has to get a 90 day order so she is asking that you approve that so that she does not run out. Patient says that she is doing well and her current meds are working. Please review and advise, thank you

## 2017-04-17 NOTE — Telephone Encounter (Signed)
Sent in 90 day rx for Prozac, Lithium and Seroquel

## 2017-04-17 NOTE — Telephone Encounter (Signed)
Okay to refill her prescription.

## 2017-04-23 ENCOUNTER — Other Ambulatory Visit: Payer: Self-pay | Admitting: Family

## 2017-04-30 ENCOUNTER — Ambulatory Visit (INDEPENDENT_AMBULATORY_CARE_PROVIDER_SITE_OTHER): Payer: Medicare Other | Admitting: Family

## 2017-04-30 VITALS — BP 154/90 | HR 76 | Temp 98.3°F | Ht 70.8 in | Wt 166.8 lb

## 2017-04-30 DIAGNOSIS — E119 Type 2 diabetes mellitus without complications: Secondary | ICD-10-CM | POA: Diagnosis not present

## 2017-04-30 DIAGNOSIS — E039 Hypothyroidism, unspecified: Secondary | ICD-10-CM

## 2017-04-30 DIAGNOSIS — I10 Essential (primary) hypertension: Secondary | ICD-10-CM

## 2017-04-30 DIAGNOSIS — F3162 Bipolar disorder, current episode mixed, moderate: Secondary | ICD-10-CM | POA: Diagnosis not present

## 2017-04-30 DIAGNOSIS — T380X5A Adverse effect of glucocorticoids and synthetic analogues, initial encounter: Secondary | ICD-10-CM | POA: Insufficient documentation

## 2017-04-30 DIAGNOSIS — E785 Hyperlipidemia, unspecified: Secondary | ICD-10-CM

## 2017-04-30 DIAGNOSIS — R739 Hyperglycemia, unspecified: Secondary | ICD-10-CM

## 2017-04-30 LAB — BASIC METABOLIC PANEL
BUN: 16 mg/dL (ref 6–23)
CO2: 26 mEq/L (ref 19–32)
CREATININE: 1.13 mg/dL (ref 0.40–1.20)
Calcium: 8.9 mg/dL (ref 8.4–10.5)
Chloride: 111 mEq/L (ref 96–112)
GFR: 51.03 mL/min — AB (ref >60.00)
GLUCOSE: 104 mg/dL — AB (ref 70–99)
Potassium: 4.7 mEq/L (ref 3.5–5.1)
SODIUM: 140 meq/L (ref 135–145)

## 2017-04-30 LAB — TSH: TSH: 0.26 u[IU]/mL — AB (ref 0.35–4.50)

## 2017-04-30 MED ORDER — LEVOTHYROXINE SODIUM 150 MCG PO TABS: 150.0000 ug | ORAL_TABLET | Freq: Every day | ORAL | 1 refills | Status: DC

## 2017-04-30 MED ORDER — ATORVASTATIN CALCIUM 40 MG PO TABS: ORAL_TABLET | ORAL | 1 refills | Status: DC

## 2017-04-30 MED ORDER — LOSARTAN POTASSIUM 50 MG PO TABS: ORAL_TABLET | ORAL | 1 refills | Status: DC

## 2017-04-30 MED ORDER — METFORMIN HCL 500 MG PO TABS: ORAL_TABLET | ORAL | 1 refills | Status: DC

## 2017-04-30 NOTE — Patient Instructions (Signed)
Please contact Dr. Blane Ohara office to schedule follow up for you pacemaker.  [phone] Complete lab work prior to leaving.

## 2017-04-30 NOTE — Assessment & Plan Note (Signed)
BP is elevated. Did not take meds. She will check her BP at [facility] this week and call me with reading after taking her meds.

## 2017-04-30 NOTE — Progress Notes (Signed)
Subjective:    Patient ID: Kathryn Buckley, female    DOB: 11-Apr-1951, 66 y.o.   MRN: [id number]  HPI  Kathryn Buckley is a 66 yr old female who presents today for follow up.  1) Bipolar 1 disorder- continues with Dr. Adele Schilder.  Reports that her mood is stable on current medications.   2) GERD-reports reflux is stable on omeprazole 20mg .    3) DM2- maintained on metformin.  Lab Results  Component Value Date   HGBA1C 5.6 10/02/2016   HGBA1C 6.2 12/14/2014   HGBA1C 6.1 (H) 06/29/2014   Lab Results  Component Value Date   LDLCALC 78 10/02/2016   CREATININE 1.05 10/02/2016   4) hyperlipidemia- tolerating statin, no myalgia.  Lab Results  Component Value Date   CHOL 149 10/02/2016   HDL 44.50 10/02/2016   LDLCALC 78 10/02/2016   LDLDIRECT 134.0 03/01/2016   TRIG 133.0 10/02/2016   CHOLHDL 3 10/02/2016   5) HTN- did not take her AM meds today.  BP Readings from Last 3 Encounters:  04/30/17 (!) 154/90  10/02/16 (!) 118/57  07/16/16 100/60   6) Hypothyroid- reports good energy on current dose of synthroid.  Lab Results  Component Value Date   TSH 3.01 03/01/2016     Review of Systems See HPI  Past Medical History:  Diagnosis Date  . COPD (chronic obstructive pulmonary disease) ([place])   . Depression   . HTN (hypertension)   . Hyperlipemia   . Seizures ([place])    Due to brain tumor that was removed in 2004  . Thyroid disease      Social History   Social History  . Marital status: Married    Spouse name: N/A  . Number of children: N/A  . Years of education: N/A   Occupational History  . Not on file.   Social History Main Topics  . Smoking status: Current Every Day Smoker    Packs/day: 2.50    Years: 40.00    Types: Cigarettes    Last attempt to quit: 03/14/2016  . Smokeless tobacco: Never Used     Comment: States interest in Chantix. Trying to quit.   . Alcohol use No     Comment: Occasional beer  . Drug use: No  . Sexual activity: Not Currently     Other Topics Concern  . Not on file   Social History Narrative   Lives with husband and 2 chihuahua   She has 2 children- one son died of drug overdose   1 living son- Kathryn Buckley- lives in [place].     2 step sons   She has worked in the past in Research officer, trade union)   She enjoys TV- likes to be home.             Past Surgical History:  Procedure Laterality Date  . BRAIN SURGERY      Family History  Problem Relation Age of Onset  . Bipolar disorder Father   . Heart disease Father   . Cancer Father        prostate  . Heart disease Mother   . Hypertension Brother   . Diabetes Brother   . Stroke Neg Hx   . Hyperlipidemia Neg Hx     Allergies  Allergen Reactions  . Lamictal [Lamotrigine] Rash  . Codeine     Other reaction(s): GI Upset (intolerance)    Current Outpatient Prescriptions on File Prior to Visit  Medication Sig Dispense Refill  .  FLUoxetine (PROZAC) 40 MG capsule Take 1 capsule (40 mg total) by mouth daily. 90 capsule 0  . glucose blood (ONE TOUCH ULTRA TEST) test strip Use as instructed to check blood sugar once a day. 100 each 1  . Lancets (ONETOUCH ULTRASOFT) lancets Use as instructed to check blood sugar once a day. 100 each 1  . lithium carbonate (LITHOBID) 300 MG CR tablet Take 2 tablets (600 mg total) by mouth at bedtime. 180 tablet 0  . omeprazole (PRILOSEC) 20 MG capsule TAKE 1 CAPSULE BY MOUTH  DAILY 90 capsule 0  . QUEtiapine (SEROQUEL) 200 MG tablet TAKE 1 TABLET(100 MG) BY MOUTH AT BEDTIME 90 tablet 0   No current facility-administered medications on file prior to visit.     BP (!) 154/90   Pulse 76   Temp 98.3 F (36.8 C) (Oral)   Ht 5' 10.8" (1.798 m)   Wt 166 lb 12.8 oz (75.7 kg)   LMP 09/23/1988   SpO2 96%   BMI 23.40 kg/m       Objective:   Physical Exam  Constitutional: She is oriented to person, place, and time. She appears well-developed and well-nourished.  Cardiovascular: Normal rate, regular rhythm and  normal heart sounds.   No murmur heard. Pulmonary/Chest: Effort normal and breath sounds normal. No respiratory distress. She has no wheezes.  Musculoskeletal: She exhibits no edema.  Neurological: She is alert and oriented to person, place, and time.  Psychiatric: She has a normal mood and affect. Her behavior is normal. Judgment and thought content normal.          Assessment & Plan:

## 2017-04-30 NOTE — Assessment & Plan Note (Signed)
Stable, management per psychiatry.

## 2017-04-30 NOTE — Progress Notes (Signed)
Pre visit review using our clinic tool,if applicable. No additional management support is needed unless otherwise documented below in the visit note.  

## 2017-04-30 NOTE — Assessment & Plan Note (Signed)
Obtain follow up A1C.  May consider d/c metformin is still <6.

## 2017-04-30 NOTE — Assessment & Plan Note (Signed)
Stable on statin. Continue same.

## 2017-05-01 LAB — HEMOGLOBIN A1C
HEMOGLOBIN A1C: 5.1 % (ref <5.7)
Mean Plasma Glucose: 100 mg/dL

## 2017-05-04 ENCOUNTER — Telehealth: Payer: Self-pay | Admitting: Family

## 2017-05-04 DIAGNOSIS — E039 Hypothyroidism, unspecified: Secondary | ICD-10-CM

## 2017-05-04 MED ORDER — LEVOTHYROXINE SODIUM 137 MCG PO TABS: 137.0000 ug | ORAL_TABLET | Freq: Every day | ORAL | 2 refills | Status: DC

## 2017-05-04 NOTE — Telephone Encounter (Signed)
Sugar is stable. It appears her thyroid medication needs to be decreased based on her lab work. Would like her to decrease Synthroid from 150-137 g daily. Repeat TSH in 6 weeks.

## 2017-05-06 ENCOUNTER — Telehealth: Payer: Self-pay | Admitting: *Deleted

## 2017-05-06 NOTE — Telephone Encounter (Signed)
I spoke with patient about scheduling below appts. Pt declines appt at this time. Reports that she relies on her brother and sister for transportation to appts and she already has 2 upcoming appts and brother just had surgery. Gave pt phone number to radiology to call and schedule appts at her earliest convenience.

## 2017-05-06 NOTE — Telephone Encounter (Signed)
-----   Message from Katha Hamming sent at 01/22/2017  8:51 AM EDT ----- Regarding: RE: CT chest I guess she hasn't had it done.   Do you want to give her a call since she hasn't called [place] back or get Danise Mina or Drue Dun to send her a letter?  Thanks! ----- Message ----- From: Ronny Flurry, CMA Sent: 01/22/2017   8:44 AM To: Katha Hamming Subject: RE: CT chest                                   I do not see any of these results in [facility] except the CT chest from 07/12/16.  ----- Message ----- From: Katha Hamming Sent: 01/20/2017   1:43 PM To: Ronny Flurry, CMA Subject: CT chest                                       Hey Amanada Philbrick:  Alajiah has not called [place] back to schedule her mammogram, bone density or CT chest.   Before I call her back, do you know if you she had any of these done anywhere else.   Her previous CT was done at [facility].  I can't see any of those reports.  Do you know if she had the CT chest done there?  This CT would be in follow-up to the one done October 2017per radiology order.   Thanks, Hoyle Sauer

## 2017-05-06 NOTE — Telephone Encounter (Signed)
Notified pt and she voices understanding. Pt declines to schedule appointment at this time. States she relies on her brother or sister to transport her to appointments and brother has just had major surgery. States she will call back to schedule lab appt in 6 weeks (06/17/17).

## 2017-05-12 DIAGNOSIS — J449 Chronic obstructive pulmonary disease, unspecified: Secondary | ICD-10-CM | POA: Diagnosis not present

## 2017-05-21 ENCOUNTER — Ambulatory Visit (HOSPITAL_COMMUNITY): Payer: Medicare Other | Admitting: Psychiatry

## 2017-05-27 ENCOUNTER — Ambulatory Visit (INDEPENDENT_AMBULATORY_CARE_PROVIDER_SITE_OTHER): Payer: Medicare Other | Admitting: Psychiatry

## 2017-05-27 ENCOUNTER — Encounter (HOSPITAL_COMMUNITY): Payer: Self-pay | Admitting: Psychiatry

## 2017-05-27 DIAGNOSIS — F3131 Bipolar disorder, current episode depressed, mild: Secondary | ICD-10-CM | POA: Diagnosis not present

## 2017-05-27 DIAGNOSIS — F17210 Nicotine dependence, cigarettes, uncomplicated: Secondary | ICD-10-CM

## 2017-05-27 DIAGNOSIS — Z818 Family history of other mental and behavioral disorders: Secondary | ICD-10-CM | POA: Diagnosis not present

## 2017-05-27 MED ORDER — QUETIAPINE FUMARATE 200 MG PO TABS: ORAL_TABLET | ORAL | 0 refills | Status: DC

## 2017-05-27 MED ORDER — FLUOXETINE HCL 40 MG PO CAPS: 40.0000 mg | ORAL_CAPSULE | Freq: Every day | ORAL | 0 refills | Status: DC

## 2017-05-27 MED ORDER — LITHIUM CARBONATE ER 300 MG PO TBCR: 600.0000 mg | EXTENDED_RELEASE_TABLET | Freq: Every day | ORAL | 0 refills | Status: DC

## 2017-05-27 NOTE — Progress Notes (Signed)
[facility] MD/PA/NP OP Progress Note  05/27/2017 2:21 PM [name]  MRN:  [id number]  Chief Complaint:  I am doing much better with increase Prozac.  HPI: Kathryn Buckley came for her follow-up appointment.  On her last visit we increase Prozac 40 mg.  She is doing much better.  She is less anxious and less depressed.  She has more energy and more motivation.  She started walking to 3 times a week.  She also had a good trip to [place] with her sister.  She sleeping good.  She denies any irritability, anger, mania or any psychosis.  Recently she's seen her primary care physician and she had blood work.  Her TSH is 0.26 but her hemoglobin A1c and and basic chemistry is normal.  Patient has no tremors, shakes or any EPS.  Her energy level is good.  She denies any feeling of hopelessness or worthlessness.  Patient denies drinking alcohol or using any illegal substances.  She is not interested in counseling.    Visit Diagnosis:    ICD-10-CM   1. Bipolar affective disorder, depressed, mild (HCC) F31.31 QUEtiapine (SEROQUEL) 200 MG tablet    lithium carbonate (LITHOBID) 300 MG CR tablet    FLUoxetine (PROZAC) 40 MG capsule    Past Psychiatric History: Reviewed. Patient has history of bipolar disorder. She started getting treatment in this office since 2008. In the past she had tried Lexapro, Cymbalta, Paxil, Zoloft and Geodon. Patient denies any history of suicidal attempt. We have tried Celexa which worked very well but it was stopped due to interaction with Nexium.  Past Medical History:  Past Medical History:  Diagnosis Date  . COPD (chronic obstructive pulmonary disease) ([place])   . Depression   . HTN (hypertension)   . Hyperlipemia   . Seizures ([place])    Due to brain tumor that was removed in 2004  . Thyroid disease     Past Surgical History:  Procedure Laterality Date  . BRAIN SURGERY      Family Psychiatric History: Reviewed.  Family History:  Family History  Problem Relation Age of  Onset  . Bipolar disorder Father   . Heart disease Father   . Cancer Father        prostate  . Heart disease Mother   . Hypertension Brother   . Diabetes Brother   . Stroke Neg Hx   . Hyperlipidemia Neg Hx     Social History:  Social History   Social History  . Marital status: Married    Spouse name: N/A  . Number of children: N/A  . Years of education: N/A   Social History Main Topics  . Smoking status: Current Every Day Smoker    Packs/day: 2.50    Years: 40.00    Types: Cigarettes    Last attempt to quit: 03/14/2016  . Smokeless tobacco: Never Used     Comment: States interest in Chantix. Trying to quit.   . Alcohol use No     Comment: Occasional beer  . Drug use: No  . Sexual activity: Not Currently   Other Topics Concern  . Not on file   Social History Narrative   Lives with husband and 2 chihuahua   She has 2 children- one son died of drug overdose   1 living son- Kathryn Buckley- lives in [place].     2 step sons   She has worked in the past in Research officer, trade union)   She enjoys TV- likes to be  home.             Allergies:  Allergies  Allergen Reactions  . Lamictal [Lamotrigine] Rash  . Codeine     Other reaction(s): GI Upset (intolerance)    Metabolic Disorder Labs: Lab Results  Component Value Date   HGBA1C 5.1 04/30/2017   MPG 100 04/30/2017   MPG 128 (H) 06/29/2014   No results found for: PROLACTIN Lab Results  Component Value Date   CHOL 149 10/02/2016   TRIG 133.0 10/02/2016   HDL 44.50 10/02/2016   CHOLHDL 3 10/02/2016   VLDL 26.6 10/02/2016   LDLCALC 78 10/02/2016   LDLCALC 157 (H) 08/07/2015   Lab Results  Component Value Date   TSH 0.26 (L) 04/30/2017   TSH 3.01 03/01/2016    Therapeutic Level Labs: Lab Results  Component Value Date   LITHIUM 0.9 10/02/2016   LITHIUM 0.50 (L) 06/29/2014   No results found for: VALPROATE No components found for:  CBMZ  Current Medications: Current Outpatient Prescriptions   Medication Sig Dispense Refill  . atorvastatin (LIPITOR) 40 MG tablet TAKE 1 TABLET(40 MG) BY MOUTH DAILY 90 tablet 1  . FLUoxetine (PROZAC) 40 MG capsule Take 1 capsule (40 mg total) by mouth daily. 90 capsule 0  . glucose blood (ONE TOUCH ULTRA TEST) test strip Use as instructed to check blood sugar once a day. 100 each 1  . Lancets (ONETOUCH ULTRASOFT) lancets Use as instructed to check blood sugar once a day. 100 each 1  . levothyroxine (SYNTHROID) 137 MCG tablet Take 1 tablet (137 mcg total) by mouth daily before breakfast. 30 tablet 2  . lithium carbonate (LITHOBID) 300 MG CR tablet Take 2 tablets (600 mg total) by mouth at bedtime. 180 tablet 0  . losartan (COZAAR) 50 MG tablet TAKE 1 TABLET(50 MG) BY MOUTH DAILY 90 tablet 1  . metFORMIN (GLUCOPHAGE) 500 MG tablet TAKE 1 TABLET(500 MG) BY MOUTH TWICE DAILY WITH A MEAL 180 tablet 1  . omeprazole (PRILOSEC) 20 MG capsule TAKE 1 CAPSULE BY MOUTH  DAILY 90 capsule 0  . QUEtiapine (SEROQUEL) 200 MG tablet TAKE 1 TABLET(100 MG) BY MOUTH AT BEDTIME 90 tablet 0   No current facility-administered medications for this visit.      Musculoskeletal: Strength & Muscle Tone: within normal limits Gait & Station: normal Patient leans: N/A  Psychiatric Specialty Exam: ROS  Blood pressure 140/84, pulse 76, height 5' 8.25" (1.734 m), weight 165 lb (74.8 kg), last menstrual period 09/23/1988.Body mass index is 24.9 kg/m.  General Appearance: Casual  Eye Contact:  Good  Speech:  Clear and Coherent  Volume:  Normal  Mood:  Euthymic  Affect:  Appropriate  Thought Process:  Goal Directed  Orientation:  Full (Time, Place, and Person)  Thought Content: Logical   Suicidal Thoughts:  No  Homicidal Thoughts:  No  Memory:  Immediate;   Good Recent;   Good Remote;   Good  Judgement:  Good  Insight:  Good  Psychomotor Activity:  Normal  Concentration:  Concentration: Good and Attention Span: Good  Recall:  Good  Fund of Knowledge: Good   Language: Good  Akathisia:  No  Handed:  Right  AIMS (if indicated): not done  Assets:  Communication Skills Desire for Improvement Housing Resilience Social Support  ADL's:  Intact  Cognition: WNL  Sleep:  Good   Screenings: PHQ2-9     Office Visit from 03/01/2016 in [facility] at [facility]  Score  4  PHQ-9 Total Score  6       Assessment and Plan: Bipolar disorder type I  Patient is a stable on her current psychiatric medication.  She is feeling better with increase Prozac.  Continue Prozac 40 mg daily, Seroquel 200 mg at bedtime and lithium 600 mg at bedtime.  We will do lithium level on her next appointment.  Encourage to continue walk 10-15 minutes 2 to 3 times a week.  Discussed medication side effects and benefits.  Recommended to call us back if she has any question or any concern.  Follow-up in 3 months.   Jette Lewan T., MD 05/27/2017, 2:21 PM

## 2017-06-12 DIAGNOSIS — J449 Chronic obstructive pulmonary disease, unspecified: Secondary | ICD-10-CM | POA: Diagnosis not present

## 2017-07-12 DIAGNOSIS — J449 Chronic obstructive pulmonary disease, unspecified: Secondary | ICD-10-CM | POA: Diagnosis not present

## 2017-07-22 ENCOUNTER — Other Ambulatory Visit: Payer: Self-pay | Admitting: Family Medicine

## 2017-08-12 DIAGNOSIS — J449 Chronic obstructive pulmonary disease, unspecified: Secondary | ICD-10-CM | POA: Diagnosis not present

## 2017-08-26 ENCOUNTER — Ambulatory Visit (HOSPITAL_COMMUNITY): Payer: Self-pay | Admitting: Psychiatry

## 2017-09-11 DIAGNOSIS — J449 Chronic obstructive pulmonary disease, unspecified: Secondary | ICD-10-CM | POA: Diagnosis not present

## 2017-10-01 ENCOUNTER — Ambulatory Visit (INDEPENDENT_AMBULATORY_CARE_PROVIDER_SITE_OTHER): Payer: Medicare Other | Admitting: Psychiatry

## 2017-10-01 ENCOUNTER — Encounter (HOSPITAL_COMMUNITY): Payer: Self-pay | Admitting: Psychiatry

## 2017-10-01 VITALS — BP 136/86 | HR 90 | Ht 67.5 in | Wt 163.0 lb

## 2017-10-01 DIAGNOSIS — F1099 Alcohol use, unspecified with unspecified alcohol-induced disorder: Secondary | ICD-10-CM

## 2017-10-01 DIAGNOSIS — F17210 Nicotine dependence, cigarettes, uncomplicated: Secondary | ICD-10-CM

## 2017-10-01 DIAGNOSIS — F3131 Bipolar disorder, current episode depressed, mild: Secondary | ICD-10-CM | POA: Diagnosis not present

## 2017-10-01 DIAGNOSIS — Z818 Family history of other mental and behavioral disorders: Secondary | ICD-10-CM

## 2017-10-01 MED ORDER — QUETIAPINE FUMARATE 200 MG PO TABS: ORAL_TABLET | ORAL | 0 refills | Status: DC

## 2017-10-01 MED ORDER — CITALOPRAM HYDROBROMIDE 20 MG PO TABS: 20.0000 mg | ORAL_TABLET | Freq: Every day | ORAL | 0 refills | Status: DC

## 2017-10-01 MED ORDER — LITHIUM CARBONATE ER 300 MG PO TBCR: 600.0000 mg | EXTENDED_RELEASE_TABLET | Freq: Every day | ORAL | 0 refills | Status: DC

## 2017-10-01 NOTE — Progress Notes (Signed)
[facility] MD/PA/NP OP Progress Note  10/01/2017 12:24 PM Kathryn Buckley  MRN:  [id number]  Chief Complaint: I think Prozac is not working.  I like to go back on Celexa.  I am no longer taking Nexium.  HPI: Kathryn Buckley came for her follow-up appointment.  We have increase Prozac 40 mg in the beginning she felt better but now she feel her depression is coming back.  She has lack of energy, lack of motivation to do things.  She is concerned about her sister-in-law who diagnosed with bone cancer.  She had a good Christmas because her son came to visit her.  Her sister also visit.  Patient and her sister are planning to buy a house together and she wanted to move out from her place.  Patient told she may have to stop smoking because her sister does not like smoke.  Patient denies any mania, psychosis, hallucination but admitted sometimes feeling hopeless, fatigue and lack of motivation.  She had a good response with Celexa however it was changed to Prozac due to interaction with Nexium.  Patient is no longer taking Nexium.  Her energy level is fair.  She denies any suicidal thoughts.  She is not interested in counseling.  Visit Diagnosis:    ICD-10-CM   1. Bipolar affective disorder, depressed, mild (HCC) F31.31 QUEtiapine (SEROQUEL) 200 MG tablet    lithium carbonate (LITHOBID) 300 MG CR tablet    citalopram (CELEXA) 20 MG tablet    Past Psychiatric History: Viewed. Patient has history of bipolar disorder. She started getting treatment in this office since 2008. In the past she had tried Lexapro, Cymbalta, Paxil, Zoloft and Geodon. Patient denies any history of suicidal attempt. We have tried Celexa which worked very well but it was stopped due to interaction with Nexium.  Past Medical History:  Past Medical History:  Diagnosis Date  . COPD (chronic obstructive pulmonary disease) ([place])   . Depression   . HTN (hypertension)   . Hyperlipemia   . Seizures ([place])    Due to brain tumor that was removed  in 2004  . Thyroid disease     Past Surgical History:  Procedure Laterality Date  . BRAIN SURGERY      Family Psychiatric History: Viewed.  Family History:  Family History  Problem Relation Age of Onset  . Bipolar disorder Father   . Heart disease Father   . Cancer Father        prostate  . Heart disease Mother   . Hypertension Brother   . Diabetes Brother   . Stroke Neg Hx   . Hyperlipidemia Neg Hx     Social History:  Social History   Socioeconomic History  . Marital status: Married    Spouse name: None  . Number of children: None  . Years of education: None  . Highest education level: None  Social Needs  . Financial resource strain: None  . Food insecurity - worry: None  . Food insecurity - inability: None  . Transportation needs - medical: None  . Transportation needs - non-medical: None  Occupational History  . None  Tobacco Use  . Smoking status: Current Every Day Smoker    Packs/day: 2.50    Years: 40.00    Pack years: 100.00    Types: Cigarettes    Last attempt to quit: 03/14/2016    Years since quitting: 1.5  . Smokeless tobacco: Never Used  . Tobacco comment: States interest in Chantix. Trying to quit.  Substance and Sexual Activity  . Alcohol use: Yes    Alcohol/week: 1.2 - 1.8 oz    Types: 2 - 3 Cans of beer per week    Comment: Occasional beer  . Drug use: No  . Sexual activity: Not Currently  Other Topics Concern  . None  Social History Narrative   Lives with husband and 2 chihuahua   She has 2 children- one son died of drug overdose   1 living son- Kathryn Buckley- lives in [place].     2 step sons   She has worked in the past in [employer], [employer])   She enjoys TV- likes to be home.             Allergies:  Allergies  Allergen Reactions  . Lamictal [Lamotrigine] Rash  . Codeine     Other reaction(s): GI Upset (intolerance)    Metabolic Disorder Labs: Lab Results  Component Value Date   HGBA1C 5.1 04/30/2017   MPG  100 04/30/2017   MPG 128 (H) 06/29/2014   No results found for: PROLACTIN Lab Results  Component Value Date   CHOL 149 10/02/2016   TRIG 133.0 10/02/2016   HDL 44.50 10/02/2016   CHOLHDL 3 10/02/2016   VLDL 26.6 10/02/2016   LDLCALC 78 10/02/2016   LDLCALC 157 (H) 08/07/2015   Lab Results  Component Value Date   TSH 0.26 (L) 04/30/2017   TSH 3.01 03/01/2016    Therapeutic Level Labs: Lab Results  Component Value Date   LITHIUM 0.9 10/02/2016   LITHIUM 0.50 (L) 06/29/2014   No results found for: VALPROATE No components found for:  CBMZ  Current Medications: Current Outpatient Medications  Medication Sig Dispense Refill  . atorvastatin (LIPITOR) 40 MG tablet TAKE 1 TABLET(40 MG) BY MOUTH DAILY 90 tablet 1  . FLUoxetine (PROZAC) 40 MG capsule Take 1 capsule (40 mg total) by mouth daily. 90 capsule 0  . glucose blood (ONE TOUCH ULTRA TEST) test strip Use as instructed to check blood sugar once a day. 100 each 1  . Lancets (ONETOUCH ULTRASOFT) lancets Use as instructed to check blood sugar once a day. 100 each 1  . levothyroxine (SYNTHROID) 137 MCG tablet Take 1 tablet (137 mcg total) by mouth daily before breakfast. 30 tablet 2  . lithium carbonate (LITHOBID) 300 MG CR tablet Take 2 tablets (600 mg total) by mouth at bedtime. 180 tablet 0  . losartan (COZAAR) 50 MG tablet TAKE 1 TABLET(50 MG) BY MOUTH DAILY 90 tablet 1  . metFORMIN (GLUCOPHAGE) 500 MG tablet TAKE 1 TABLET(500 MG) BY MOUTH TWICE DAILY WITH A MEAL 180 tablet 1  . omeprazole (PRILOSEC) 20 MG capsule TAKE 1 CAPSULE BY MOUTH  DAILY 90 capsule 0  . QUEtiapine (SEROQUEL) 200 MG tablet TAKE 1 TABLET(100 MG) BY MOUTH AT BEDTIME 90 tablet 0   No current facility-administered medications for this visit.      Musculoskeletal: Strength & Muscle Tone: within normal limits Gait & Station: normal Patient leans: N/A  Psychiatric Specialty Exam: ROS  Blood pressure 136/86, pulse 90, height 5' 7.5" (1.715 m), weight  163 lb (73.9 kg), last menstrual period 09/23/1988.Body mass index is 25.15 kg/m.  General Appearance: Casual  Eye Contact:  Good  Speech:  Clear and Coherent  Volume:  Normal  Mood:  Depressed and Dysphoric  Affect:  Constricted  Thought Process:  Goal Directed  Orientation:  Full (Time, Place, and Person)  Thought Content: Logical   Suicidal Thoughts:  No  Homicidal Thoughts:  No  Memory:  Immediate;   Good Recent;   Good Remote;   Good  Judgement:  Good  Insight:  Good  Psychomotor Activity:  Decreased  Concentration:  Concentration: Good and Attention Span: Good  Recall:  Good  Fund of Knowledge: Good  Language: Good  Akathisia:  No  Handed:  Right  AIMS (if indicated): not done  Assets:  Communication Skills Desire for Improvement Housing Resilience Social Support  ADL's:  Intact  Cognition: WNL  Sleep:  Fair   Screenings: PHQ2-9     Office Visit from 03/01/2016 in [facility] at [facility]  PHQ-2 Total Score  4  PHQ-9 Total Score  6       Assessment and Plan: Bipolar disorder type I.  I will discontinue Prozac.  Patient had a good response with Celexa in the past need to discontinue due to interaction with Nexium..  She is no longer taking Nexium.  Start Celexa 200 mg daily, continue Seroquel 200 mg at bedtime and lithium 600 mg at bedtime.  We will do lithium level on her next appointment.  Recommended to call us back if she has any question, concern if she feels worsening of the symptoms.  Discussed cigarette smoking cessation.  Patient realized once she moved in with her sister she cannot smoke.  Discussed healthy lifestyle.  Encourage to watch her calorie intake and do regular exercise.  Follow-up in 3 months.   Kathlee Nations, MD 10/01/2017, 12:24 PM

## 2017-10-07 ENCOUNTER — Other Ambulatory Visit (HOSPITAL_COMMUNITY): Payer: Self-pay | Admitting: Psychiatry

## 2017-10-07 ENCOUNTER — Telehealth (HOSPITAL_COMMUNITY): Payer: Self-pay

## 2017-10-07 NOTE — Telephone Encounter (Signed)
Patent called with concerns about taking Celexa with Seroquel, the pharmacist told her that it can cause elongated QT lines - please review and advise, thank you

## 2017-10-07 NOTE — Telephone Encounter (Signed)
Patient had a good response with Celexa in the past.  She need EKG to see her QTC.  Please order EKG.

## 2017-10-08 ENCOUNTER — Other Ambulatory Visit (HOSPITAL_COMMUNITY): Payer: Self-pay

## 2017-10-08 DIAGNOSIS — F3162 Bipolar disorder, current episode mixed, moderate: Secondary | ICD-10-CM

## 2017-10-08 DIAGNOSIS — Z79899 Other long term (current) drug therapy: Secondary | ICD-10-CM

## 2017-10-08 NOTE — Telephone Encounter (Signed)
I called the patient and she said that hse never started the Celexa. What is the dose and sig of Prozac you want me to send?

## 2017-10-08 NOTE — Telephone Encounter (Signed)
Entered the order into Epic and called the patient. Patient states that she would rather not take Celexa and does not want to go in for an EKG. She would like to continue her Prozac and would like to know if you can send in a prescription. Order cancelled for EKG. Please advise, thank you

## 2017-10-08 NOTE — Telephone Encounter (Signed)
If she wants to continue Prozac then we would discontinue Celexa and she can go back to Prozac.

## 2017-10-09 ENCOUNTER — Other Ambulatory Visit (HOSPITAL_COMMUNITY): Payer: Self-pay | Admitting: Psychiatry

## 2017-10-09 NOTE — Telephone Encounter (Signed)
She was taking prozac 40 mg

## 2017-10-10 MED ORDER — FLUOXETINE HCL 40 MG PO CAPS: 40.0000 mg | ORAL_CAPSULE | Freq: Every day | ORAL | 2 refills | Status: DC

## 2017-10-10 NOTE — Addendum Note (Signed)
Addended by: Lethea Killings on: 10/10/2017 10:27 AM   Modules accepted: Orders

## 2017-10-12 DIAGNOSIS — J449 Chronic obstructive pulmonary disease, unspecified: Secondary | ICD-10-CM | POA: Diagnosis not present

## 2017-10-13 ENCOUNTER — Other Ambulatory Visit: Payer: Self-pay | Admitting: Family

## 2017-10-24 ENCOUNTER — Telehealth: Payer: Self-pay | Admitting: Family

## 2017-10-24 ENCOUNTER — Other Ambulatory Visit (HOSPITAL_COMMUNITY): Payer: Self-pay | Admitting: Psychiatry

## 2017-10-24 DIAGNOSIS — F3131 Bipolar disorder, current episode depressed, mild: Secondary | ICD-10-CM

## 2017-10-24 DIAGNOSIS — E785 Hyperlipidemia, unspecified: Secondary | ICD-10-CM

## 2017-10-28 NOTE — Telephone Encounter (Signed)
Pt last seen 04/2017 and was due for a follow up in November. Pt is now past due for f/u.  Please call pt to schedule appt with Melissa soon. Pt will need to see Melissa before further refills can be given. Thanks!

## 2017-10-28 NOTE — Telephone Encounter (Signed)
Called pt and LVM to call back and schedule a follow up visit before and further refills could be approved.

## 2017-11-12 DIAGNOSIS — J449 Chronic obstructive pulmonary disease, unspecified: Secondary | ICD-10-CM | POA: Diagnosis not present

## 2017-11-26 ENCOUNTER — Other Ambulatory Visit: Payer: Self-pay | Admitting: Family

## 2017-11-26 DIAGNOSIS — E785 Hyperlipidemia, unspecified: Secondary | ICD-10-CM

## 2017-11-28 NOTE — Telephone Encounter (Signed)
2 week supply sent on atorvastatin, levothyroxine, losartan and metformin. Pt was due for follow up in November and is past due. Please call pt to schedule appt with Melissa soon. Thanks!

## 2017-12-01 NOTE — Telephone Encounter (Signed)
Called pt and scheduled a med follow up for 12/10/17

## 2017-12-10 ENCOUNTER — Encounter: Payer: Self-pay | Admitting: Family

## 2017-12-10 ENCOUNTER — Ambulatory Visit (INDEPENDENT_AMBULATORY_CARE_PROVIDER_SITE_OTHER): Payer: Medicare Other | Admitting: Family

## 2017-12-10 VITALS — BP 158/82 | HR 129 | Resp 20 | Ht 70.0 in | Wt 164.8 lb

## 2017-12-10 DIAGNOSIS — E119 Type 2 diabetes mellitus without complications: Secondary | ICD-10-CM | POA: Diagnosis not present

## 2017-12-10 DIAGNOSIS — I10 Essential (primary) hypertension: Secondary | ICD-10-CM | POA: Diagnosis not present

## 2017-12-10 DIAGNOSIS — E039 Hypothyroidism, unspecified: Secondary | ICD-10-CM

## 2017-12-10 DIAGNOSIS — F329 Major depressive disorder, single episode, unspecified: Secondary | ICD-10-CM | POA: Diagnosis not present

## 2017-12-10 DIAGNOSIS — F32A Depression, unspecified: Secondary | ICD-10-CM

## 2017-12-10 DIAGNOSIS — E785 Hyperlipidemia, unspecified: Secondary | ICD-10-CM | POA: Diagnosis not present

## 2017-12-10 DIAGNOSIS — K219 Gastro-esophageal reflux disease without esophagitis: Secondary | ICD-10-CM

## 2017-12-10 DIAGNOSIS — J449 Chronic obstructive pulmonary disease, unspecified: Secondary | ICD-10-CM | POA: Diagnosis not present

## 2017-12-10 LAB — HEMOGLOBIN A1C: HEMOGLOBIN A1C: 5.6 % (ref 4.6–6.5)

## 2017-12-10 LAB — TSH: TSH: 1.64 u[IU]/mL (ref 0.35–4.50)

## 2017-12-10 MED ORDER — METFORMIN HCL 500 MG PO TABS: ORAL_TABLET | ORAL | 1 refills | Status: DC

## 2017-12-10 MED ORDER — LEVOTHYROXINE SODIUM 137 MCG PO TABS: ORAL_TABLET | ORAL | 1 refills | Status: DC

## 2017-12-10 MED ORDER — LOSARTAN POTASSIUM 100 MG PO TABS: 100.0000 mg | ORAL_TABLET | Freq: Every day | ORAL | 0 refills | Status: DC

## 2017-12-10 MED ORDER — ATORVASTATIN CALCIUM 40 MG PO TABS: ORAL_TABLET | ORAL | 1 refills | Status: DC

## 2017-12-10 MED ORDER — OMEPRAZOLE 20 MG PO CPDR: 20.0000 mg | DELAYED_RELEASE_CAPSULE | Freq: Every day | ORAL | 1 refills | Status: DC

## 2017-12-10 NOTE — Patient Instructions (Addendum)
Please complete lab work prior to leaving.  Increase losartan to 100mg  once daily.

## 2017-12-10 NOTE — Progress Notes (Signed)
Subjective:    Patient ID: Kathryn Buckley, female    DOB: 09-19-1951, 67 y.o.   MRN: [id number]  HPI  Patient is a 67 year old female who presents today for routine follow-up.  DM2-medications include metformin 500 mg twice daily. Reports sugars have been stable at home.  Lab Results  Component Value Date   HGBA1C 5.1 04/30/2017   HGBA1C 5.6 10/02/2016   HGBA1C 6.2 12/14/2014   Lab Results  Component Value Date   LDLCALC 78 10/02/2016   CREATININE 1.13 04/30/2017   Hyperlipidemia-she is maintained on atorvastatin 40 mg once daily. Lab Results  Component Value Date   CHOL 149 10/02/2016   HDL 44.50 10/02/2016   LDLCALC 78 10/02/2016   LDLDIRECT 134.0 03/01/2016   TRIG 133.0 10/02/2016   CHOLHDL 3 10/02/2016     Hypothyroid-maintained on Synthroid 137 mcg once daily. Lab Results  Component Value Date   TSH 0.26 (L) 04/30/2017   Wt Readings from Last 3 Encounters:  12/10/17 164 lb 12.8 oz (74.8 kg)  04/30/17 166 lb 12.8 oz (75.7 kg)  10/02/16 171 lb (77.6 kg)   Hypertension-current blood pressure medication includes losartan 50 mg once daily. Reports good compliance with her medication. Very upset about her dog dying 3 days ago.   BP Readings from Last 3 Encounters:  12/10/17 (!) 168/84  04/30/17 (!) 154/90  10/02/16 (!) 118/57     GERD-maintained on omeprazole 20 mg once daily. Reports symptoms are well controlled.    Depression-this is managed by psychiatry. Reports depression has been stable.   Review of Systems    see HPI  Past Medical History:  Diagnosis Date  . COPD (chronic obstructive pulmonary disease) ([place])   . Depression   . HTN (hypertension)   . Hyperlipemia   . Seizures ([place])    Due to brain tumor that was removed in 2004  . Thyroid disease      Social History   Socioeconomic History  . Marital status: Married    Spouse name: Not on file  . Number of children: Not on file  . Years of education: Not on file  . Highest  education level: Not on file  Social Needs  . Financial resource strain: Not on file  . Food insecurity - worry: Not on file  . Food insecurity - inability: Not on file  . Transportation needs - medical: Not on file  . Transportation needs - non-medical: Not on file  Occupational History  . Not on file  Tobacco Use  . Smoking status: Current Every Day Smoker    Packs/day: 2.50    Years: 40.00    Pack years: 100.00    Types: Cigarettes    Last attempt to quit: 03/14/2016    Years since quitting: 1.7  . Smokeless tobacco: Never Used  . Tobacco comment: States interest in Chantix. Trying to quit.   Substance and Sexual Activity  . Alcohol use: Yes    Alcohol/week: 1.2 - 1.8 oz    Types: 2 - 3 Cans of beer per week    Comment: Occasional beer  . Drug use: No  . Sexual activity: Not Currently  Other Topics Concern  . Not on file  Social History Narrative   Lives with husband and 2 chihuahua   She has 2 children- one son died of drug overdose   1 living son- Kathryn Buckley- lives in [place].     2 step sons   She has worked in the past in  [employer]ing [employer])   She enjoys TV- likes to be home.             Past Surgical History:  Procedure Laterality Date  . BRAIN SURGERY      Family History  Problem Relation Age of Onset  . Bipolar disorder Father   . Heart disease Father   . Cancer Father        prostate  . Heart disease Mother   . Hypertension Brother   . Diabetes Brother   . Stroke Neg Hx   . Hyperlipidemia Neg Hx     Allergies  Allergen Reactions  . Lamictal [Lamotrigine] Rash  . Codeine     Other reaction(s): GI Upset (intolerance)    Current Outpatient Medications on File Prior to Visit  Medication Sig Dispense Refill  . atorvastatin (LIPITOR) 40 MG tablet TAKE 1 TABLET(40 MG) BY MOUTH DAILY 14 tablet 0  . FLUoxetine (PROZAC) 40 MG capsule Take 1 capsule (40 mg total) by mouth daily. 30 capsule 2  . glucose blood (ONE TOUCH ULTRA TEST) test  strip Use as instructed to check blood sugar once a day. 100 each 1  . Lancets (ONETOUCH ULTRASOFT) lancets Use as instructed to check blood sugar once a day. 100 each 1  . levothyroxine (SYNTHROID, LEVOTHROID) 137 MCG tablet TAKE 1 TABLET(137 MCG) BY MOUTH DAILY BEFORE BREAKFAST 14 tablet 0  . lithium carbonate (LITHOBID) 300 MG CR tablet Take 2 tablets (600 mg total) by mouth at bedtime. 180 tablet 0  . losartan (COZAAR) 50 MG tablet TAKE 1 TABLET BY MOUTH DAILY 14 tablet 0  . metFORMIN (GLUCOPHAGE) 500 MG tablet TAKE 1 TABLET BY MOUTH TWICE DAILY WITH A MEAL. 28 tablet 0  . omeprazole (PRILOSEC) 20 MG capsule TAKE 1 CAPSULE BY MOUTH  DAILY 30 capsule 0  . QUEtiapine (SEROQUEL) 200 MG tablet TAKE 1 TABLET(100 MG) BY MOUTH AT BEDTIME 90 tablet 0   No current facility-administered medications on file prior to visit.     BP (!) 168/84 (BP Location: Left Arm, Patient Position: Sitting, Cuff Size: Normal)   Pulse (!) 129   Resp 20   Ht 5\' 10"  (1.778 m)   Wt 164 lb 12.8 oz (74.8 kg)   LMP 09/23/1988   SpO2 100%   BMI 23.65 kg/m    Objective:   Physical Exam  Constitutional: She is oriented to person, place, and time. She appears well-developed and well-nourished.  Cardiovascular: Normal rate, regular rhythm and normal heart sounds.  No murmur heard. Pulmonary/Chest: Effort normal and breath sounds normal. No respiratory distress. She has no wheezes.  Musculoskeletal: She exhibits no edema.  Neurological: She is alert and oriented to person, place, and time.  Psychiatric: Her behavior is normal. Judgment and thought content normal.  Seems sad today          Assessment & Plan:  Diabetes type 2-appears very well controlled.  Continue metformin obtain follow-up A1c.  Hypertension- blood pressure is elevated today.  Will increase losartan from 50-100 mg once daily.  Check basic metabolic panel today.  She is advised to follow-up in 2 weeks for nurse visit for blood pressure recheck  as well as a follow-up basic metabolic panel at that time.  Depression-appears sad today however she reports that she lost her favorite dog on Sunday.  She is grieving from this.  Support is provided.  She reports overall her depression has been well controlled.  This is managed by psychiatry.  GERD-stable on 20 mg of omeprazole.  Hyperlipidemia-tolerating statin.  Obtain follow-up lipid panel.  Hypothyroid-clinically stable on Synthroid will obtain follow-up TSH.  It appears that she did not follow-up as instructed after last medication adjustment for TSH recheck.

## 2017-12-11 ENCOUNTER — Telehealth: Payer: Self-pay | Admitting: Family

## 2017-12-11 LAB — BASIC METABOLIC PANEL
BUN: 17 mg/dL (ref 6–23)
CO2: 20 meq/L (ref 19–32)
CREATININE: 1.3 mg/dL — AB (ref 0.40–1.20)
Calcium: 9.4 mg/dL (ref 8.4–10.5)
Chloride: 113 mEq/L — ABNORMAL HIGH (ref 96–112)
GFR: 43.33 mL/min — ABNORMAL LOW (ref >60.00)
Glucose, Bld: 122 mg/dL — ABNORMAL HIGH (ref 70–99)
Potassium: 5.5 mEq/L — ABNORMAL HIGH (ref 3.5–5.1)
Sodium: 142 mEq/L (ref 135–145)

## 2017-12-11 LAB — LIPID PANEL
Cholesterol: 156 mg/dL (ref 0–200)
HDL: 62.1 mg/dL (ref >39.00)
LDL Cholesterol: 79 mg/dL (ref 0–99)
NONHDL: 94.31
Total CHOL/HDL Ratio: 3
Triglycerides: 79 mg/dL (ref 0.0–149.0)
VLDL: 15.8 mg/dL (ref 0.0–40.0)

## 2017-12-11 LAB — HEPATIC FUNCTION PANEL
ALT: 12 U/L (ref 0–35)
AST: 18 U/L (ref 0–37)
Albumin: 4.6 g/dL (ref 3.5–5.2)
Alkaline Phosphatase: 55 U/L (ref 39–117)
BILIRUBIN DIRECT: 0.1 mg/dL (ref 0.0–0.3)
TOTAL PROTEIN: 6.5 g/dL (ref 6.0–8.3)
Total Bilirubin: 0.2 mg/dL (ref 0.2–1.2)

## 2017-12-11 MED ORDER — AMLODIPINE BESYLATE 5 MG PO TABS
5.0000 mg | ORAL_TABLET | Freq: Every day | ORAL | 3 refills | Status: DC
Start: 2017-12-11 — End: 2018-03-19

## 2017-12-11 NOTE — Telephone Encounter (Signed)
Thyroid testing looks good. Sugar is stable. I would like her to stop metformin.  Potassium is elevated- This may be due to her losartan.  I would like her to stop losartan and start amlodipine 5mg . Also, kidney function is decreased slightly. If she iis taking any NSAIDS she should stop.  Follow up with me in 2 weeks.

## 2017-12-15 NOTE — Telephone Encounter (Signed)
Left message for pt to return my call.

## 2017-12-16 DIAGNOSIS — Z950 Presence of cardiac pacemaker: Secondary | ICD-10-CM | POA: Diagnosis not present

## 2017-12-24 NOTE — Telephone Encounter (Signed)
Left message for pt to return my call. Mailed letter as well.

## 2017-12-30 ENCOUNTER — Ambulatory Visit (HOSPITAL_COMMUNITY): Payer: Medicare Other | Admitting: Psychiatry

## 2018-01-10 DIAGNOSIS — J449 Chronic obstructive pulmonary disease, unspecified: Secondary | ICD-10-CM | POA: Diagnosis not present

## 2018-01-14 ENCOUNTER — Other Ambulatory Visit (HOSPITAL_COMMUNITY): Payer: Self-pay | Admitting: Psychiatry

## 2018-01-14 DIAGNOSIS — F3131 Bipolar disorder, current episode depressed, mild: Secondary | ICD-10-CM

## 2018-01-19 ENCOUNTER — Other Ambulatory Visit (HOSPITAL_COMMUNITY): Payer: Self-pay

## 2018-01-19 DIAGNOSIS — F3131 Bipolar disorder, current episode depressed, mild: Secondary | ICD-10-CM

## 2018-01-20 ENCOUNTER — Other Ambulatory Visit (HOSPITAL_COMMUNITY): Payer: Self-pay

## 2018-01-20 DIAGNOSIS — F3131 Bipolar disorder, current episode depressed, mild: Secondary | ICD-10-CM

## 2018-01-20 MED ORDER — QUETIAPINE FUMARATE 200 MG PO TABS: ORAL_TABLET | ORAL | 0 refills | Status: DC

## 2018-02-04 ENCOUNTER — Ambulatory Visit (INDEPENDENT_AMBULATORY_CARE_PROVIDER_SITE_OTHER): Payer: Medicare Other | Admitting: Psychiatry

## 2018-02-04 ENCOUNTER — Encounter (HOSPITAL_COMMUNITY): Payer: Self-pay | Admitting: Psychiatry

## 2018-02-04 VITALS — BP 132/80 | HR 63 | Ht 69.5 in | Wt 162.0 lb

## 2018-02-04 DIAGNOSIS — F17210 Nicotine dependence, cigarettes, uncomplicated: Secondary | ICD-10-CM

## 2018-02-04 DIAGNOSIS — F3131 Bipolar disorder, current episode depressed, mild: Secondary | ICD-10-CM | POA: Diagnosis not present

## 2018-02-04 DIAGNOSIS — Z818 Family history of other mental and behavioral disorders: Secondary | ICD-10-CM | POA: Diagnosis not present

## 2018-02-04 DIAGNOSIS — F1099 Alcohol use, unspecified with unspecified alcohol-induced disorder: Secondary | ICD-10-CM

## 2018-02-04 DIAGNOSIS — Z79899 Other long term (current) drug therapy: Secondary | ICD-10-CM

## 2018-02-04 MED ORDER — FLUOXETINE HCL 40 MG PO CAPS: 40.0000 mg | ORAL_CAPSULE | Freq: Every day | ORAL | 0 refills | Status: DC

## 2018-02-04 MED ORDER — LITHIUM CARBONATE ER 300 MG PO TBCR: 600.0000 mg | EXTENDED_RELEASE_TABLET | Freq: Every day | ORAL | 0 refills | Status: DC

## 2018-02-04 MED ORDER — QUETIAPINE FUMARATE 200 MG PO TABS: ORAL_TABLET | ORAL | 0 refills | Status: DC

## 2018-02-04 NOTE — Progress Notes (Signed)
BH MD/PA/NP OP Progress Note  02/04/2018 1:55 PM Kathryn Buckley  MRN:  [id number]  Chief Complaint: I am doing good on my medication.  I do not think my medicine needs to change.  Prozac working very well.  HPI: Kathryn Buckley came for her follow-up appointment.  On her last visit we switch from Prozac to Celexa because she felt Prozac not working.  However she is also taking Nexium and interaction with Nexium caused QT prolongation and she never took Celexa.  She does not want to change her Nexium since it is working very well for her chronic pain.  She decided to go back on Prozac and not she feel it is working very well.  She recently had a beach trip with her sister that went very well.  She is sleeping good.  She denies any irritability, anger, mania, psychosis or any hallucination.  Her mood is a stable.  She denies any irritability or any anger.  She denies any crying spells.  Recently she seen her primary care physician and she has blood work.  Her hemoglobin A1c is normal however her creatinine is 1.30.  She admitted taking too many Aleve for her pain which she stopped now.  She also drinking enough water.  Her primary care physician did not do lithium level.  She denies any tremors or shakes.  She has a good support from her sister.  Patient denies drinking or using any illegal substances.  Visit Diagnosis:    ICD-10-CM   1. Bipolar affective disorder, depressed, mild (HCC) F31.31 QUEtiapine (SEROQUEL) 200 MG tablet    lithium carbonate (LITHOBID) 300 MG CR tablet    FLUoxetine (PROZAC) 40 MG capsule    Past Psychiatric History: Reviewed Patient has history of bipolar disorder. She started getting treatment in this office since 2008. In the past she had tried Lexapro, Cymbalta, Paxil, Zoloft and Geodon. Patient denies any history of suicidal attempt. We have tried Celexa which worked very well but it was stopped due to interaction with Nexium.  Past Medical History:  Past Medical  History:  Diagnosis Date  . COPD (chronic obstructive pulmonary disease) ([place])   . Depression   . HTN (hypertension)   . Hyperlipemia   . Seizures ([place])    Due to brain tumor that was removed in 2004  . Thyroid disease     Past Surgical History:  Procedure Laterality Date  . BRAIN SURGERY      Family Psychiatric History: Reviewed  Family History:  Family History  Problem Relation Age of Onset  . Bipolar disorder Father   . Heart disease Father   . Cancer Father        prostate  . Heart disease Mother   . Hypertension Brother   . Diabetes Brother   . Stroke Neg Hx   . Hyperlipidemia Neg Hx     Social History:  Social History   Socioeconomic History  . Marital status: Married    Spouse name: Not on file  . Number of children: Not on file  . Years of education: Not on file  . Highest education level: Not on file  Occupational History  . Not on file  Social Needs  . Financial resource strain: Not on file  . Food insecurity:    Worry: Not on file    Inability: Not on file  . Transportation needs:    Medical: Not on file    Non-medical: Not on file  Tobacco Use  . Smoking  status: Current Every Day Smoker    Packs/day: 2.50    Years: 40.00    Pack years: 100.00    Types: Cigarettes    Last attempt to quit: 03/14/2016    Years since quitting: 1.8  . Smokeless tobacco: Never Used  . Tobacco comment: States interest in Chantix. Trying to quit.   Substance and Sexual Activity  . Alcohol use: Yes    Alcohol/week: 1.2 - 1.8 oz    Types: 2 - 3 Cans of beer per week    Comment: Occasional beer  . Drug use: No  . Sexual activity: Not Currently  Lifestyle  . Physical activity:    Days per week: Not on file    Minutes per session: Not on file  . Stress: Not on file  Relationships  . Social connections:    Talks on phone: Not on file    Gets together: Not on file    Attends religious service: Not on file    Active member of club or organization: Not on file     Attends meetings of clubs or organizations: Not on file    Relationship status: Not on file  Other Topics Concern  . Not on file  Social History Narrative   Lives with husband and 2 chihuahua   She has 2 children- one son died of drug overdose   1 living son- Kathryn Buckley- lives in [place].     2 step sons   She has worked in the past in Research officer, trade union)   She enjoys TV- likes to be home.             Allergies:  Allergies  Allergen Reactions  . Lamictal [Lamotrigine] Rash  . Codeine     Other reaction(s): GI Upset (intolerance)    Metabolic Disorder Labs: Recent Results (from the past 2160 hour(s))  Basic metabolic panel     Status: Abnormal   Collection Time: 12/10/17 11:41 AM  Result Value Ref Range   Sodium 142 135 - 145 mEq/L   Potassium 5.5 (H) 3.5 - 5.1 mEq/L   Chloride 113 (H) 96 - 112 mEq/L   CO2 20 19 - 32 mEq/L   Glucose, Bld 122 (H) 70 - 99 mg/dL   BUN 17 6 - 23 mg/dL   Creatinine, Ser 1.30 (H) 0.40 - 1.20 mg/dL   Calcium 9.4 8.4 - 10.5 mg/dL   GFR 43.33 (L) >60.00 mL/min  Hepatic function panel     Status: None   Collection Time: 12/10/17 11:41 AM  Result Value Ref Range   Total Bilirubin 0.2 0.2 - 1.2 mg/dL   Bilirubin, Direct 0.1 0.0 - 0.3 mg/dL   Alkaline Phosphatase 55 39 - 117 U/L   AST 18 0 - 37 U/L   ALT 12 0 - 35 U/L   Total Protein 6.5 6.0 - 8.3 g/dL   Albumin 4.6 3.5 - 5.2 g/dL  TSH     Status: None   Collection Time: 12/10/17 11:41 AM  Result Value Ref Range   TSH 1.64 0.35 - 4.50 uIU/mL  Hemoglobin A1c     Status: None   Collection Time: 12/10/17 11:41 AM  Result Value Ref Range   Hgb A1c MFr Bld 5.6 4.6 - 6.5 %    Comment: Glycemic Control Guidelines for People with Diabetes:Non Diabetic:  <6%Goal of Therapy: <7%Additional Action Suggested:  >8%   Lipid panel     Status: None   Collection Time: 12/10/17 11:41 AM  Result Value Ref Range   Cholesterol 156 0 - 200 mg/dL    Comment: ATP III Classification       Desirable:   < 200 mg/dL               Borderline High:  200 - 239 mg/dL          High:  > = 240 mg/dL   Triglycerides 79.0 0.0 - 149.0 mg/dL    Comment: Normal:  <150 mg/dLBorderline High:  150 - 199 mg/dL   HDL 62.10 >39.00 mg/dL   VLDL 15.8 0.0 - 40.0 mg/dL   LDL Cholesterol 79 0 - 99 mg/dL   Total CHOL/HDL Ratio 3     Comment:                Men          Women1/2 Average Risk     3.4          3.3Average Risk          5.0          4.42X Average Risk          9.6          7.13X Average Risk          15.0          11.0                       NonHDL 94.31     Comment: NOTE:  Non-HDL goal should be 30 mg/dL higher than patient's LDL goal (i.e. LDL goal of < 70 mg/dL, would have non-HDL goal of < 100 mg/dL)   Lab Results  Component Value Date   HGBA1C 5.6 12/10/2017   MPG 100 04/30/2017   MPG 128 (H) 06/29/2014   No results found for: PROLACTIN Lab Results  Component Value Date   CHOL 156 12/10/2017   TRIG 79.0 12/10/2017   HDL 62.10 12/10/2017   CHOLHDL 3 12/10/2017   VLDL 15.8 12/10/2017   LDLCALC 79 12/10/2017   LDLCALC 78 10/02/2016   Lab Results  Component Value Date   TSH 1.64 12/10/2017   TSH 0.26 (L) 04/30/2017    Therapeutic Level Labs: Lab Results  Component Value Date   LITHIUM 0.9 10/02/2016   LITHIUM 0.50 (L) 06/29/2014   No results found for: VALPROATE No components found for:  CBMZ  Current Medications: Current Outpatient Medications  Medication Sig Dispense Refill  . amLODipine (NORVASC) 5 MG tablet Take 1 tablet (5 mg total) by mouth daily. 30 tablet 3  . atorvastatin (LIPITOR) 40 MG tablet 1 tab by mouth once daily 90 tablet 1  . FLUoxetine (PROZAC) 40 MG capsule Take 1 capsule (40 mg total) by mouth daily. 30 capsule 2  . glucose blood (ONE TOUCH ULTRA TEST) test strip Use as instructed to check blood sugar once a day. 100 each 1  . Lancets (ONETOUCH ULTRASOFT) lancets Use as instructed to check blood sugar once a day. 100 each 1  . levothyroxine (SYNTHROID,  LEVOTHROID) 137 MCG tablet TAKE 1 TABLET(137 MCG) BY MOUTH DAILY BEFORE BREAKFAST 90 tablet 1  . lithium carbonate (LITHOBID) 300 MG CR tablet Take 2 tablets (600 mg total) by mouth at bedtime. 180 tablet 0  . omeprazole (PRILOSEC) 20 MG capsule Take 1 capsule (20 mg total) by mouth daily. 90 capsule 1  . QUEtiapine (SEROQUEL) 200 MG tablet TAKE 1 TABLET(100 MG) BY MOUTH AT BEDTIME 30  tablet 0   No current facility-administered medications for this visit.      Musculoskeletal: Strength & Muscle Tone: within normal limits Gait & Station: normal Patient leans: N/A  Psychiatric Specialty Exam: ROS  Blood pressure 132/80, pulse 63, height 5' 9.5" (1.765 m), weight 162 lb (73.5 kg), last menstrual period 09/23/1988.Body mass index is 23.58 kg/m.  General Appearance: Casual  Eye Contact:  Good  Speech:  Clear and Coherent  Volume:  Normal  Mood:  Euthymic  Affect:  Congruent  Thought Process:  Goal Directed  Orientation:  Full (Time, Place, and Person)  Thought Content: Logical   Suicidal Thoughts:  No  Homicidal Thoughts:  No  Memory:  Immediate;   Good Recent;   Good Remote;   Good  Judgement:  Good  Insight:  Good  Psychomotor Activity:  Normal  Concentration:  Concentration: Good and Attention Span: Good  Recall:  Good  Fund of Knowledge: Good  Language: Good  Akathisia:  No  Handed:  Right  AIMS (if indicated): not done  Assets:  Communication Skills Desire for Improvement Housing Resilience  ADL's:  Intact  Cognition: WNL  Sleep:  Good   Screenings: PHQ2-9     Office Visit from 12/10/2017 in [facility] at [place] Visit from 03/01/2016 in [place] at [facility]  PHQ-2 Total Score  4  4  PHQ-9 Total Score  8  6       Assessment and Plan: Bipolar disorder type I.  Patient like to continue Prozac since she does not want to change Nexium and she also feel Prozac working very well.  I reviewed  blood work results.  She has creatinine 1.30.  Her hemoglobin A1c is normal.  We will do lithium level today and repeat comprehensive metabolic panel.  Patient does not want to change lithium since it is working very well.  However if creatinine continues to go up then we will consider switching to Lamictal.  Recommended to call us back if she has any question, concern for feel worsening of the symptoms.  Follow-up in 3 months.   Kathlee Nations, MD 02/04/2018, 1:55 PM

## 2018-02-05 LAB — COMPREHENSIVE METABOLIC PANEL
ALBUMIN: 4.7 g/dL (ref 3.6–4.8)
ALT: 13 IU/L (ref 0–32)
AST: 23 IU/L (ref 0–40)
Albumin/Globulin Ratio: 2.1 (ref 1.2–2.2)
Alkaline Phosphatase: 73 IU/L (ref 39–117)
BUN / CREAT RATIO: 14 (ref 12–28)
BUN: 19 mg/dL (ref 8–27)
Bilirubin Total: 0.2 mg/dL (ref 0.0–1.2)
CO2: 15 mmol/L — ABNORMAL LOW (ref 20–29)
CREATININE: 1.4 mg/dL — AB (ref 0.57–1.00)
Calcium: 9.6 mg/dL (ref 8.7–10.3)
Chloride: 111 mmol/L — ABNORMAL HIGH (ref 96–106)
GFR calc Af Amer: 45 mL/min/{1.73_m2} — ABNORMAL LOW (ref >59)
GFR, EST NON AFRICAN AMERICAN: 39 mL/min/{1.73_m2} — AB (ref >59)
GLOBULIN, TOTAL: 2.2 g/dL (ref 1.5–4.5)
Glucose: 106 mg/dL — ABNORMAL HIGH (ref 65–99)
Potassium: 5.7 mmol/L — ABNORMAL HIGH (ref 3.5–5.2)
SODIUM: 145 mmol/L — AB (ref 134–144)
Total Protein: 6.9 g/dL (ref 6.0–8.5)

## 2018-02-05 LAB — LITHIUM LEVEL: LITHIUM LVL: 0.8 mmol/L (ref 0.6–1.2)

## 2018-02-09 DIAGNOSIS — J449 Chronic obstructive pulmonary disease, unspecified: Secondary | ICD-10-CM | POA: Diagnosis not present

## 2018-03-12 DIAGNOSIS — J449 Chronic obstructive pulmonary disease, unspecified: Secondary | ICD-10-CM | POA: Diagnosis not present

## 2018-03-17 ENCOUNTER — Other Ambulatory Visit: Payer: Self-pay

## 2018-03-17 DIAGNOSIS — Z45010 Encounter for checking and testing of cardiac pacemaker pulse generator [battery]: Secondary | ICD-10-CM | POA: Diagnosis not present

## 2018-03-17 NOTE — Patient Outreach (Signed)
[facility]) Care Management  03/17/2018  Kathryn Buckley March 12, 1951 [id number]   Medication Adherence call to [name] left a message for patient to call back patient is due on Metformin 500 mg and Losartan 100 mg [facility] said last time she pick up was in March.[name] is showing past due under [place].  [facility] Management Direct Dial [phone]  Fax [phone] [name]

## 2018-03-19 ENCOUNTER — Telehealth: Payer: Self-pay | Admitting: Family

## 2018-03-20 NOTE — Telephone Encounter (Signed)
Amlodipine 5mg  sent to pharmacy for 30 day supply. Pt last seen by PCP in March but has no future appts scheduled. When should pt follow up in the office?

## 2018-03-21 NOTE — Telephone Encounter (Signed)
Needs 6 month follow up please.

## 2018-03-24 ENCOUNTER — Other Ambulatory Visit: Payer: Self-pay

## 2018-03-24 NOTE — Patient Outreach (Signed)
[facility]) Care Management  03/24/2018  MOSETTA FERDINAND 1951-06-04 [id number]   Medication Adherence to Mrs. Adler Alton left a message for patient to call back patient is due on Metformin 500 mg and Losartan 100 mg.Mrs. Styles is showing past due under [place] [employer].   [place] Management Direct Dial [phone]  Fax [phone] [email or web address]

## 2018-03-25 NOTE — Telephone Encounter (Signed)
Called pt and LVM informing pt that a 30 day supply was sent to the pharmacy, however, they are due for a 6 month follow up. Advised pt to call back and schedule at her convenience.

## 2018-04-11 DIAGNOSIS — J449 Chronic obstructive pulmonary disease, unspecified: Secondary | ICD-10-CM | POA: Diagnosis not present

## 2018-04-22 ENCOUNTER — Other Ambulatory Visit: Payer: Self-pay | Admitting: Family

## 2018-04-22 ENCOUNTER — Other Ambulatory Visit (HOSPITAL_COMMUNITY): Payer: Self-pay | Admitting: Psychiatry

## 2018-04-22 DIAGNOSIS — F3131 Bipolar disorder, current episode depressed, mild: Secondary | ICD-10-CM

## 2018-04-23 ENCOUNTER — Other Ambulatory Visit: Payer: Self-pay

## 2018-04-23 NOTE — Patient Outreach (Signed)
[facility]) Care Management  04/23/2018  Kathryn Buckley 1950-10-06 [id number]   Medication Adherence call to mrs. Kathryn Buckley spoke with patient she is no longer taking Losartan 100 mg and Metformin 500 mg doctor Conley Canal took her off on both medications.[name] is showing past due under [place].  [place] Management Direct Dial [phone]  Fax [phone] [email or web address]

## 2018-04-24 NOTE — Telephone Encounter (Signed)
Kathryn Buckley -- please advise refill request. Pt was due for nurse visit in April for BP and is past due. 30 day supply was given at end of June w/note that f/u was needed. Message was left on her voicemail on 03/25/18 of need for office visit.

## 2018-04-29 ENCOUNTER — Other Ambulatory Visit (HOSPITAL_COMMUNITY): Payer: Self-pay

## 2018-04-29 DIAGNOSIS — F3131 Bipolar disorder, current episode depressed, mild: Secondary | ICD-10-CM

## 2018-04-29 MED ORDER — FLUOXETINE HCL 40 MG PO CAPS: 40.0000 mg | ORAL_CAPSULE | Freq: Every day | ORAL | 0 refills | Status: DC

## 2018-04-29 MED ORDER — LITHIUM CARBONATE ER 300 MG PO TBCR: 600.0000 mg | EXTENDED_RELEASE_TABLET | Freq: Every day | ORAL | 0 refills | Status: DC

## 2018-04-29 MED ORDER — QUETIAPINE FUMARATE 200 MG PO TABS: ORAL_TABLET | ORAL | 0 refills | Status: DC

## 2018-05-02 DIAGNOSIS — J449 Chronic obstructive pulmonary disease, unspecified: Secondary | ICD-10-CM | POA: Diagnosis not present

## 2018-05-03 NOTE — Progress Notes (Signed)
Subjective:    Patient ID: Kathryn Buckley, female    DOB: 01-25-1951, 67 y.o.   MRN: [id number]  HPI  Patient presents today for routine follow up:  HTN- maintained on losartan once daily. Reports that she did not take the AM of her last appointment when it was higher.  BP Readings from Last 3 Encounters:  05/04/18 124/78  12/10/17 (!) 158/82  04/30/17 (!) 154/90   DM2- no longer on metformin.  Lab Results  Component Value Date   HGBA1C 5.6 12/10/2017   HGBA1C 5.1 04/30/2017   HGBA1C 5.6 10/02/2016   Lab Results  Component Value Date   LDLCALC 79 12/10/2017   CREATININE 1.40 (H) 02/04/2018   Hypothyroid- maintained on synthroid 137 mcg once daily.  Lab Results  Component Value Date   TSH 1.64 12/10/2017   GERD- maintained on omeprazole 20mg .  Reports symptoms are well controlled.   Depression- continues to follow with psychiatry. Reports mood is stable on current regimen.   Hyperlipidemia- maintianed on atorvastatin.   Lab Results  Component Value Date   CHOL 156 12/10/2017   HDL 62.10 12/10/2017   LDLCALC 79 12/10/2017   LDLDIRECT 134.0 03/01/2016   TRIG 79.0 12/10/2017   CHOLHDL 3 12/10/2017     Review of Systems See HPI  Past Medical History:  Diagnosis Date  . COPD (chronic obstructive pulmonary disease) ([place])   . Depression   . HTN (hypertension)   . Hyperlipemia   . Seizures ([place])    Due to brain tumor that was removed in 2004  . Thyroid disease      Social History   Socioeconomic History  . Marital status: Married    Spouse name: Not on file  . Number of children: Not on file  . Years of education: Not on file  . Highest education level: Not on file  Occupational History  . Not on file  Social Needs  . Financial resource strain: Not on file  . Food insecurity:    Worry: Not on file    Inability: Not on file  . Transportation needs:    Medical: Not on file    Non-medical: Not on file  Tobacco Use  . Smoking status: Current  Every Day Smoker    Packs/day: 2.50    Years: 40.00    Pack years: 100.00    Types: Cigarettes    Last attempt to quit: 03/14/2016    Years since quitting: 2.1  . Smokeless tobacco: Never Used  . Tobacco comment: States interest in Chantix. Trying to quit.   Substance and Sexual Activity  . Alcohol use: Yes    Alcohol/week: 2.0 - 3.0 standard drinks    Types: 2 - 3 Cans of beer per week    Comment: Occasional beer  . Drug use: No  . Sexual activity: Not Currently  Lifestyle  . Physical activity:    Days per week: Not on file    Minutes per session: Not on file  . Stress: Not on file  Relationships  . Social connections:    Talks on phone: Not on file    Gets together: Not on file    Attends religious service: Not on file    Active member of club or organization: Not on file    Attends meetings of clubs or organizations: Not on file    Relationship status: Not on file  . Intimate partner violence:    Fear of current or ex partner: Not on  file    Emotionally abused: Not on file    Physically abused: Not on file    Forced sexual activity: Not on file  Other Topics Concern  . Not on file  Social History Narrative   Lives with husband and 2 chihuahua   She has 2 children- one son died of drug overdose   1 living son- [name]- lives in [place].     2 step sons   She has worked in the past in Research officer, trade union)   She enjoys TV- likes to be home.             Past Surgical History:  Procedure Laterality Date  . BRAIN SURGERY      Family History  Problem Relation Age of Onset  . Bipolar disorder Father   . Heart disease Father   . Cancer Father        prostate  . Heart disease Mother   . Hypertension Brother   . Diabetes Brother   . Stroke Neg Hx   . Hyperlipidemia Neg Hx     Allergies  Allergen Reactions  . Lamictal [Lamotrigine] Rash  . Codeine     Other reaction(s): GI Upset (intolerance)    Current Outpatient Medications on File Prior to  Visit  Medication Sig Dispense Refill  . amLODipine (NORVASC) 5 MG tablet TAKE 1 TABLET(5 MG) BY MOUTH DAILY 7 tablet 0  . atorvastatin (LIPITOR) 40 MG tablet 1 tab by mouth once daily 90 tablet 1  . FLUoxetine (PROZAC) 40 MG capsule Take 1 capsule (40 mg total) by mouth daily. 90 capsule 0  . glucose blood (ONE TOUCH ULTRA TEST) test strip Use as instructed to check blood sugar once a day. 100 each 1  . Lancets (ONETOUCH ULTRASOFT) lancets Use as instructed to check blood sugar once a day. 100 each 1  . levothyroxine (SYNTHROID, LEVOTHROID) 137 MCG tablet TAKE 1 TABLET(137 MCG) BY MOUTH DAILY BEFORE BREAKFAST 90 tablet 1  . lithium carbonate (LITHOBID) 300 MG CR tablet Take 2 tablets (600 mg total) by mouth at bedtime. 180 tablet 0  . omeprazole (PRILOSEC) 20 MG capsule Take 1 capsule (20 mg total) by mouth daily. 90 capsule 1  . QUEtiapine (SEROQUEL) 200 MG tablet TAKE 1 TABLET(100 MG) BY MOUTH AT BEDTIME 90 tablet 0   No current facility-administered medications on file prior to visit.     BP 124/78 (BP Location: Right Arm, Patient Position: Sitting, Cuff Size: Small)   Pulse (!) 55   Temp 98.1 F (36.7 C) (Oral)   Resp 16   Ht 5' (1.524 m)   Wt 163 lb 3.2 oz (74 kg)   LMP 09/23/1988   SpO2 95%   BMI 31.87 kg/m       Objective:   Physical Exam  Constitutional: She is oriented to person, place, and time. She appears well-developed and well-nourished.  Cardiovascular: Normal rate, regular rhythm and normal heart sounds.  No murmur heard. Pulmonary/Chest: Effort normal and breath sounds normal. No respiratory distress. She has no wheezes.  Musculoskeletal: She exhibits no edema.  Neurological: She is alert and oriented to person, place, and time.  Psychiatric: She has a normal mood and affect. Her behavior is normal. Judgment and thought content normal.          Assessment & Plan:  HTN- bp stable, continue current med/check bmet.   DM2- clinically stable off of meds.  Obtain follow up BMET/A1C.  Hyperlipidemia- lipids at goal, tolerating  statin, continue same.   Depression- stable. Management per psychiatry.   GERD- stable on PPI, continue same.   Hx of pacemaker- requests referral to cardiology- will arrange.

## 2018-05-04 ENCOUNTER — Ambulatory Visit (INDEPENDENT_AMBULATORY_CARE_PROVIDER_SITE_OTHER): Payer: Medicare Other | Admitting: Family

## 2018-05-04 ENCOUNTER — Telehealth: Payer: Self-pay | Admitting: Family

## 2018-05-04 ENCOUNTER — Encounter: Payer: Self-pay | Admitting: Family

## 2018-05-04 ENCOUNTER — Other Ambulatory Visit: Payer: Self-pay

## 2018-05-04 VITALS — BP 124/78 | HR 55 | Temp 98.1°F | Resp 16 | Ht 60.0 in | Wt 163.2 lb

## 2018-05-04 DIAGNOSIS — F32A Depression, unspecified: Secondary | ICD-10-CM

## 2018-05-04 DIAGNOSIS — Z950 Presence of cardiac pacemaker: Secondary | ICD-10-CM

## 2018-05-04 DIAGNOSIS — I10 Essential (primary) hypertension: Secondary | ICD-10-CM

## 2018-05-04 DIAGNOSIS — E119 Type 2 diabetes mellitus without complications: Secondary | ICD-10-CM | POA: Diagnosis not present

## 2018-05-04 DIAGNOSIS — K219 Gastro-esophageal reflux disease without esophagitis: Secondary | ICD-10-CM

## 2018-05-04 DIAGNOSIS — E039 Hypothyroidism, unspecified: Secondary | ICD-10-CM | POA: Diagnosis not present

## 2018-05-04 DIAGNOSIS — F329 Major depressive disorder, single episode, unspecified: Secondary | ICD-10-CM

## 2018-05-04 DIAGNOSIS — E785 Hyperlipidemia, unspecified: Secondary | ICD-10-CM

## 2018-05-04 LAB — BASIC METABOLIC PANEL
BUN: 19 mg/dL (ref 6–23)
CHLORIDE: 110 meq/L (ref 96–112)
CO2: 25 meq/L (ref 19–32)
Calcium: 9.5 mg/dL (ref 8.4–10.5)
Creatinine, Ser: 1.51 mg/dL — ABNORMAL HIGH (ref 0.40–1.20)
GFR: 36.41 mL/min — ABNORMAL LOW (ref >60.00)
Glucose, Bld: 128 mg/dL — ABNORMAL HIGH (ref 70–99)
POTASSIUM: 5.7 meq/L — AB (ref 3.5–5.1)
Sodium: 141 mEq/L (ref 135–145)

## 2018-05-04 LAB — HEMOGLOBIN A1C: HEMOGLOBIN A1C: 5.7 % (ref 4.6–6.5)

## 2018-05-04 LAB — TSH: TSH: 0.28 u[IU]/mL — AB (ref 0.35–4.50)

## 2018-05-04 MED ORDER — ATORVASTATIN CALCIUM 40 MG PO TABS: ORAL_TABLET | ORAL | 1 refills | Status: DC

## 2018-05-04 MED ORDER — LEVOTHYROXINE SODIUM 137 MCG PO TABS: ORAL_TABLET | ORAL | 1 refills | Status: DC

## 2018-05-04 MED ORDER — LEVOTHYROXINE SODIUM 125 MCG PO TABS: 125.0000 ug | ORAL_TABLET | Freq: Every day | ORAL | 0 refills | Status: DC

## 2018-05-04 MED ORDER — AMLODIPINE BESYLATE 5 MG PO TABS: ORAL_TABLET | ORAL | 1 refills | Status: DC

## 2018-05-04 MED ORDER — OMEPRAZOLE 20 MG PO CPDR: 20.0000 mg | DELAYED_RELEASE_CAPSULE | Freq: Every day | ORAL | 1 refills | Status: DC

## 2018-05-04 NOTE — Patient Instructions (Signed)
Please complete lab work prior to leaving.   

## 2018-05-04 NOTE — Telephone Encounter (Signed)
Pt made aware of lab results and will call back to schedule lab appt this week once she is able to find someone to bring her to the appt. Pt has already picked up synthroid rx and was not able to look at the bottle at the moment. I informed her that when she has a chance to make sure the bottle has 125 mcg on it and not 137 mcg. Pt will check and if it's not correct will give the office or the pharmacy a call back.

## 2018-05-04 NOTE — Telephone Encounter (Signed)
Please let pt know that potassium is elevated. Please repeat bmet this week to make sure it wasn't due to the specimen being hemolyzed. Dx hyperkalemia. Also, looks like we need to decrease synthroid from 137 mcg to 119mcg. Repeat tsh in 6 weeks.  Please ask pharmacy to cancel previous rx for 121mcg sent this AM.

## 2018-05-12 DIAGNOSIS — J449 Chronic obstructive pulmonary disease, unspecified: Secondary | ICD-10-CM | POA: Diagnosis not present

## 2018-06-02 DIAGNOSIS — J449 Chronic obstructive pulmonary disease, unspecified: Secondary | ICD-10-CM | POA: Diagnosis not present

## 2018-06-12 DIAGNOSIS — J449 Chronic obstructive pulmonary disease, unspecified: Secondary | ICD-10-CM | POA: Diagnosis not present

## 2018-06-17 DIAGNOSIS — Z950 Presence of cardiac pacemaker: Secondary | ICD-10-CM | POA: Diagnosis not present

## 2018-07-12 DIAGNOSIS — J449 Chronic obstructive pulmonary disease, unspecified: Secondary | ICD-10-CM | POA: Diagnosis not present

## 2018-07-29 ENCOUNTER — Other Ambulatory Visit (HOSPITAL_COMMUNITY): Payer: Self-pay | Admitting: Psychiatry

## 2018-07-29 DIAGNOSIS — F3131 Bipolar disorder, current episode depressed, mild: Secondary | ICD-10-CM

## 2018-08-10 ENCOUNTER — Ambulatory Visit (INDEPENDENT_AMBULATORY_CARE_PROVIDER_SITE_OTHER): Payer: Medicare Other | Admitting: Psychiatry

## 2018-08-10 ENCOUNTER — Encounter (HOSPITAL_COMMUNITY): Payer: Self-pay | Admitting: Psychiatry

## 2018-08-10 VITALS — BP 132/80 | Ht 69.0 in | Wt 164.0 lb

## 2018-08-10 DIAGNOSIS — F411 Generalized anxiety disorder: Secondary | ICD-10-CM

## 2018-08-10 DIAGNOSIS — F3131 Bipolar disorder, current episode depressed, mild: Secondary | ICD-10-CM

## 2018-08-10 MED ORDER — FLUOXETINE HCL 40 MG PO CAPS: 40.0000 mg | ORAL_CAPSULE | Freq: Every day | ORAL | 0 refills | Status: DC

## 2018-08-10 NOTE — Progress Notes (Signed)
BH MD/PA/NP OP Progress Note  08/10/2018 2:07 PM Kathryn Buckley  MRN:  [id number]  Chief Complaint: Last week I fell and had a black eye and pain in my knee joint.  HPI: Kathryn Buckley came for her follow-up appointment.  She is taking her medication and she feel her medicine is working.  She denies any depression, irritability, mania or any psychosis.  Last week she fell at her porch and had a black eye and pain in the knee joint.  She is feeling better.  She still have a old bruises around her eye.  She is sleeping okay.  She denies any tremors or shakes.  However I review her blood work results.  Her creatinine is slowly going up.  Her last creatinine was 1.51 which was done 3 months ago.  Her potassium was also 5.7.  We have discussed in the past switching to a different mood stabilizer but she is very reluctant to change her lithium since it had helped her a lot.  She reported Prozac helping her depression and she denies any recent crying spells or any feeling of hopelessness or worthlessness.  She admitted sometimes drink on and off and there are days when she drinks 4-5 beer in one day.  Though she denies any intoxication, blackouts or any seizures.  She also admitted that she used to take a lot of Aleve but now she has cut down and has not taken it in past 4 weeks.  Her energy level is fair.  She had a good support from her sister.  She denies any major panic attack.  Visit Diagnosis:    ICD-10-CM   1. GAD (generalized anxiety disorder) F41.1 FLUoxetine (PROZAC) 40 MG capsule  2. Bipolar affective disorder, depressed, mild (HCC) F31.31 FLUoxetine (PROZAC) 40 MG capsule    Past Psychiatric History: Reviewed Patient has bipolar disorder. She started treatment in this office since 2008. She tried Lexapro, Cymbalta, Paxil, Zoloft and Geodon. No history of suicidal attempt.  We have tried Celexa which worked very well but it was stopped due to interaction with Nexium.  Past Medical History:   Past Medical History:  Diagnosis Date  . COPD (chronic obstructive pulmonary disease) ([place])   . Depression   . HTN (hypertension)   . Hyperlipemia   . Seizures ([place])    Due to brain tumor that was removed in 2004  . Thyroid disease     Past Surgical History:  Procedure Laterality Date  . BRAIN SURGERY      Family Psychiatric History: Viewed.  Family History:  Family History  Problem Relation Age of Onset  . Bipolar disorder Father   . Heart disease Father   . Cancer Father        prostate  . Heart disease Mother   . Hypertension Brother   . Diabetes Brother   . Stroke Neg Hx   . Hyperlipidemia Neg Hx     Social History:  Social History   Socioeconomic History  . Marital status: Married    Spouse name: Not on file  . Number of children: Not on file  . Years of education: Not on file  . Highest education level: Not on file  Occupational History  . Not on file  Social Needs  . Financial resource strain: Not on file  . Food insecurity:    Worry: Not on file    Inability: Not on file  . Transportation needs:    Medical: Not on file  Non-medical: Not on file  Tobacco Use  . Smoking status: Current Every Day Smoker    Packs/day: 2.50    Years: 40.00    Pack years: 100.00    Types: Cigarettes    Last attempt to quit: 03/14/2016    Years since quitting: 2.4  . Smokeless tobacco: Never Used  . Tobacco comment: States interest in Chantix. Trying to quit.   Substance and Sexual Activity  . Alcohol use: Yes    Alcohol/week: 2.0 - 3.0 standard drinks    Types: 2 - 3 Cans of beer per week    Comment: Occasional beer  . Drug use: No  . Sexual activity: Not Currently  Lifestyle  . Physical activity:    Days per week: Not on file    Minutes per session: Not on file  . Stress: Not on file  Relationships  . Social connections:    Talks on phone: Not on file    Gets together: Not on file    Attends religious service: Not on file    Active member of club or  organization: Not on file    Attends meetings of clubs or organizations: Not on file    Relationship status: Not on file  Other Topics Concern  . Not on file  Social History Narrative   Lives with husband and 2 chihuahua   She has 2 children- one son died of drug overdose   1 living son- Kathryn Buckley- lives in [place].     2 step sons   She has worked in the past in Research officer, trade union)   She enjoys TV- likes to be home.             Allergies:  Allergies  Allergen Reactions  . Lamictal [Lamotrigine] Rash  . Codeine     Other reaction(s): GI Upset (intolerance)    Metabolic Disorder Labs: No results found for this or any previous visit (from the past 2160 hour(s)). Lab Results  Component Value Date   HGBA1C 5.7 05/04/2018   MPG 100 04/30/2017   MPG 128 (H) 06/29/2014   No results found for: PROLACTIN Lab Results  Component Value Date   CHOL 156 12/10/2017   TRIG 79.0 12/10/2017   HDL 62.10 12/10/2017   CHOLHDL 3 12/10/2017   VLDL 15.8 12/10/2017   LDLCALC 79 12/10/2017   LDLCALC 78 10/02/2016   Lab Results  Component Value Date   TSH 0.28 (L) 05/04/2018   TSH 1.64 12/10/2017    Therapeutic Level Labs: Lab Results  Component Value Date   LITHIUM 0.8 02/04/2018   LITHIUM 0.9 10/02/2016   No results found for: VALPROATE No components found for:  CBMZ  Current Medications: Current Outpatient Medications  Medication Sig Dispense Refill  . amLODipine (NORVASC) 5 MG tablet TAKE 1 TABLET(5 MG) BY MOUTH DAILY 90 tablet 1  . atorvastatin (LIPITOR) 40 MG tablet 1 tab by mouth once daily 90 tablet 1  . FLUoxetine (PROZAC) 40 MG capsule Take 1 capsule (40 mg total) by mouth daily. 90 capsule 0  . glucose blood (ONE TOUCH ULTRA TEST) test strip Use as instructed to check blood sugar once a day. 100 each 1  . Lancets (ONETOUCH ULTRASOFT) lancets Use as instructed to check blood sugar once a day. 100 each 1  . levothyroxine (SYNTHROID, LEVOTHROID) 125 MCG  tablet Take 1 tablet (125 mcg total) by mouth daily. 90 tablet 0  . lithium carbonate (LITHOBID) 300 MG CR tablet TAKE 2 TABLETS(600  MG) BY MOUTH AT BEDTIME 180 tablet 0  . omeprazole (PRILOSEC) 20 MG capsule Take 1 capsule (20 mg total) by mouth daily. 90 capsule 1  . QUEtiapine (SEROQUEL) 200 MG tablet TAKE 1 TABLET BY MOUTH AT BEDTIME 90 tablet 0   No current facility-administered medications for this visit.      Musculoskeletal: Strength & Muscle Tone: within normal limits Gait & Station: normal Patient leans: N/A  Psychiatric Specialty Exam: ROS  Blood pressure 132/80, height 5\' 9"  (1.753 m), weight 164 lb (74.4 kg), last menstrual period 09/23/1988.Body mass index is 24.22 kg/m.  General Appearance: Casual and Had a black eye on her right side with old bruises.  Eye Contact:  Good  Speech:  Clear and Coherent  Volume:  Normal  Mood:  Anxious  Affect:  Congruent  Thought Process:  Goal Directed  Orientation:  Full (Time, Place, and Person)  Thought Content: Logical   Suicidal Thoughts:  No  Homicidal Thoughts:  No  Memory:  Immediate;   Good Recent;   Good Remote;   Good  Judgement:  Fair  Insight:  Present  Psychomotor Activity:  Normal  Concentration:  Concentration: Fair and Attention Span: Fair  Recall:  [place] of Knowledge: Good  Language: Good  Akathisia:  No  Handed:  Right  AIMS (if indicated): not done  Assets:  Communication Skills Desire for Improvement Housing Resilience Social Support  ADL's:  Intact  Cognition: WNL  Sleep:  Good   Screenings: PHQ2-9     Office Visit from 12/10/2017 in [facility] at [place] Visit from 03/01/2016 in [place] at [facility]  PHQ-2 Total Score  4  4  PHQ-9 Total Score  8  6       Assessment and Plan: Bipolar disorder type I.  Generalized anxiety disorder.  Rule out alcohol use  I reviewed her blood work results.  Her creatinine is slowly  going up.  She is very reluctant to change her lithium which had her.  She believe taking too much Aleve had caused high creatinine and now she had stopped.  I recommended she should consult with her primary care physician and had another blood work and if her creatinine remains high then we have to switch to a different mood stabilizer.  We also talked about cutting down her drinking since it interferes with psychotropic medication.  In the meantime I will continue Seroquel 100 mg at bedtime, Prozac 40 mg daily and lithium 600 mg at bedtime.  Recommended to call us back if she has any question or any concern.  Follow-up in 6 weeks to 8 weeks.  I will forward my note to her primary care physician Orpah Cobb for the management of renal insufficiency.      Kathlee Nations, MD 08/10/2018, 2:07 PM

## 2018-08-12 DIAGNOSIS — J449 Chronic obstructive pulmonary disease, unspecified: Secondary | ICD-10-CM | POA: Diagnosis not present

## 2018-09-11 DIAGNOSIS — J449 Chronic obstructive pulmonary disease, unspecified: Secondary | ICD-10-CM | POA: Diagnosis not present

## 2018-10-06 DIAGNOSIS — J449 Chronic obstructive pulmonary disease, unspecified: Secondary | ICD-10-CM | POA: Diagnosis not present

## 2018-10-12 DIAGNOSIS — J449 Chronic obstructive pulmonary disease, unspecified: Secondary | ICD-10-CM | POA: Diagnosis not present

## 2018-10-26 ENCOUNTER — Other Ambulatory Visit (HOSPITAL_COMMUNITY): Payer: Self-pay

## 2018-10-26 DIAGNOSIS — F3131 Bipolar disorder, current episode depressed, mild: Secondary | ICD-10-CM

## 2018-10-26 MED ORDER — QUETIAPINE FUMARATE 200 MG PO TABS: ORAL_TABLET | ORAL | 0 refills | Status: DC

## 2018-11-06 ENCOUNTER — Other Ambulatory Visit (HOSPITAL_COMMUNITY): Payer: Self-pay

## 2018-11-06 DIAGNOSIS — J449 Chronic obstructive pulmonary disease, unspecified: Secondary | ICD-10-CM | POA: Diagnosis not present

## 2018-11-06 DIAGNOSIS — F3131 Bipolar disorder, current episode depressed, mild: Secondary | ICD-10-CM

## 2018-11-06 MED ORDER — LITHIUM CARBONATE ER 300 MG PO TBCR: EXTENDED_RELEASE_TABLET | ORAL | 0 refills | Status: DC

## 2018-11-10 ENCOUNTER — Ambulatory Visit (HOSPITAL_COMMUNITY): Payer: Medicare Other | Admitting: Psychiatry

## 2018-11-12 DIAGNOSIS — J449 Chronic obstructive pulmonary disease, unspecified: Secondary | ICD-10-CM | POA: Diagnosis not present

## 2018-11-27 ENCOUNTER — Ambulatory Visit (INDEPENDENT_AMBULATORY_CARE_PROVIDER_SITE_OTHER): Payer: Medicare Other | Admitting: Psychiatry

## 2018-11-27 DIAGNOSIS — F411 Generalized anxiety disorder: Secondary | ICD-10-CM

## 2018-11-27 DIAGNOSIS — F3131 Bipolar disorder, current episode depressed, mild: Secondary | ICD-10-CM | POA: Diagnosis not present

## 2018-11-27 MED ORDER — LITHIUM CARBONATE ER 300 MG PO TBCR: EXTENDED_RELEASE_TABLET | ORAL | 1 refills | Status: DC

## 2018-11-27 MED ORDER — FLUOXETINE HCL 40 MG PO CAPS: 40.0000 mg | ORAL_CAPSULE | Freq: Every day | ORAL | 0 refills | Status: DC

## 2018-11-27 MED ORDER — QUETIAPINE FUMARATE 200 MG PO TABS: ORAL_TABLET | ORAL | 1 refills | Status: DC

## 2018-11-27 MED ORDER — CARBAMAZEPINE ER 100 MG PO TB12: 100.0000 mg | ORAL_TABLET | Freq: Every day | ORAL | 1 refills | Status: DC

## 2018-11-27 NOTE — Progress Notes (Signed)
BH MD/PA/NP OP Progress Note  11/27/2018 11:53 AM Kathryn Buckley  MRN:  [id number]  Chief Complaint: I am completely out of Seroquel.  I am doing okay.  HPI: Kathryn Buckley came for her appointment.  She is taking her medication and her depression is a stable.  She denies any recent mania, psychosis or any anxiety attack.  I reviewed her blood work and her last lithium level was done in May 2019.  Her creatinine is 1.51.  We talked about high creatinine and high potassium and renal insufficiency.  In the beginning she was reluctant to try any other medicine as she felt that lithium has done a great job.  However after some discussion she agreed to give a trial of other mood stabilizer.  She had tried Lamictal that caused rash.  We will try Tegretol 100 mg daily and reduce lithium to 300 mg and if she do well then we will discontinue lithium.  She feels her depression is much better with the Prozac.  She has no more falls but sometimes she feels dizzy.  She admitted continue to drink 4-5 beers 1-2 times a week but she had cut down from the past.  She also stopped taking Aleve.  Her energy level is fair.  She had a good support from her sister.  She denies any paranoia or any hallucination.  She denies any suicidal thoughts.  She has no tremors or shakes.  Visit Diagnosis:    ICD-10-CM   1. Bipolar affective disorder, depressed, mild (HCC) F31.31 lithium carbonate (LITHOBID) 300 MG CR tablet    QUEtiapine (SEROQUEL) 200 MG tablet    FLUoxetine (PROZAC) 40 MG capsule    carbamazepine (TEGRETOL XR) 100 MG 12 hr tablet  2. GAD (generalized anxiety disorder) F41.1 FLUoxetine (PROZAC) 40 MG capsule    Past Psychiatric History: Reviewed. H/O bipolar disorder. Started treatment in this office since 2008. Tried Lexapro, Cymbalta, Paxil, Zoloft, Geodon and Lamictal (rash). No h/o suicidal attempt.  Tried Celexa worked very well but it was stopped due to interaction with Nexium.  Past Medical History:  Past  Medical History:  Diagnosis Date  . COPD (chronic obstructive pulmonary disease) ([place])   . Depression   . HTN (hypertension)   . Hyperlipemia   . Seizures ([place])    Due to brain tumor that was removed in 2004  . Thyroid disease     Past Surgical History:  Procedure Laterality Date  . BRAIN SURGERY      Family Psychiatric History: Reviewed.  Family History:  Family History  Problem Relation Age of Onset  . Bipolar disorder Father   . Heart disease Father   . Cancer Father        prostate  . Heart disease Mother   . Hypertension Brother   . Diabetes Brother   . Stroke Neg Hx   . Hyperlipidemia Neg Hx     Social History:  Social History   Socioeconomic History  . Marital status: Married    Spouse name: Not on file  . Number of children: Not on file  . Years of education: Not on file  . Highest education level: Not on file  Occupational History  . Not on file  Social Needs  . Financial resource strain: Not on file  . Food insecurity:    Worry: Not on file    Inability: Not on file  . Transportation needs:    Medical: Not on file    Non-medical: Not on file  Tobacco Use  . Smoking status: Current Every Day Smoker    Packs/day: 2.50    Years: 40.00    Pack years: 100.00    Types: Cigarettes    Last attempt to quit: 03/14/2016    Years since quitting: 2.7  . Smokeless tobacco: Never Used  . Tobacco comment: States interest in Chantix. Trying to quit.   Substance and Sexual Activity  . Alcohol use: Yes    Alcohol/week: 2.0 - 3.0 standard drinks    Types: 2 - 3 Cans of beer per week    Comment: Occasional beer  . Drug use: No  . Sexual activity: Not Currently  Lifestyle  . Physical activity:    Days per week: Not on file    Minutes per session: Not on file  . Stress: Not on file  Relationships  . Social connections:    Talks on phone: Not on file    Gets together: Not on file    Attends religious service: Not on file    Active member of club or  organization: Not on file    Attends meetings of clubs or organizations: Not on file    Relationship status: Not on file  Other Topics Concern  . Not on file  Social History Narrative   Lives with husband and 2 chihuahua   She has 2 children- one son died of drug overdose   1 living son- Kathryn Buckley- lives in [place].     2 step sons   She has worked in the past in Research officer, trade union)   She enjoys TV- likes to be home.             Allergies:  Allergies  Allergen Reactions  . Lamictal [Lamotrigine] Rash  . Codeine     Other reaction(s): GI Upset (intolerance)    Metabolic Disorder Labs: Lab Results  Component Value Date   HGBA1C 5.7 05/04/2018   MPG 100 04/30/2017   MPG 128 (H) 06/29/2014   No results found for: PROLACTIN Lab Results  Component Value Date   CHOL 156 12/10/2017   TRIG 79.0 12/10/2017   HDL 62.10 12/10/2017   CHOLHDL 3 12/10/2017   VLDL 15.8 12/10/2017   LDLCALC 79 12/10/2017   LDLCALC 78 10/02/2016   Lab Results  Component Value Date   TSH 0.28 (L) 05/04/2018   TSH 1.64 12/10/2017    Therapeutic Level Labs: Lab Results  Component Value Date   LITHIUM 0.8 02/04/2018   LITHIUM 0.9 10/02/2016   No results found for: VALPROATE No components found for:  CBMZ  Current Medications: Current Outpatient Medications  Medication Sig Dispense Refill  . amLODipine (NORVASC) 5 MG tablet TAKE 1 TABLET(5 MG) BY MOUTH DAILY 90 tablet 1  . atorvastatin (LIPITOR) 40 MG tablet 1 tab by mouth once daily 90 tablet 1  . FLUoxetine (PROZAC) 40 MG capsule Take 1 capsule (40 mg total) by mouth daily. 90 capsule 0  . glucose blood (ONE TOUCH ULTRA TEST) test strip Use as instructed to check blood sugar once a day. 100 each 1  . Lancets (ONETOUCH ULTRASOFT) lancets Use as instructed to check blood sugar once a day. 100 each 1  . levothyroxine (SYNTHROID, LEVOTHROID) 125 MCG tablet Take 1 tablet (125 mcg total) by mouth daily. 90 tablet 0  . lithium  carbonate (LITHOBID) 300 MG CR tablet TAKE 2 TABLETS(600 MG) BY MOUTH AT BEDTIME 60 tablet 0  . omeprazole (PRILOSEC) 20 MG capsule Take 1 capsule (20  mg total) by mouth daily. 90 capsule 1  . QUEtiapine (SEROQUEL) 200 MG tablet TAKE 1 TABLET BY MOUTH AT BEDTIME 30 tablet 0   No current facility-administered medications for this visit.      Musculoskeletal: Strength & Muscle Tone: within normal limits Gait & Station: normal Patient leans: N/A  Psychiatric Specialty Exam: ROS  Blood pressure 130/78, height 5\' 10"  (1.778 m), weight 172 lb (78 kg), last menstrual period 09/23/1988.There is no height or weight on file to calculate BMI.  General Appearance: Casual  Eye Contact:  Fair  Speech:  Slow  Volume:  Normal  Mood:  Euthymic  Affect:  Congruent  Thought Process:  Goal Directed  Orientation:  Full (Time, Place, and Person)  Thought Content: WDL   Suicidal Thoughts:  No  Homicidal Thoughts:  No  Memory:  Immediate;   Good Recent;   Good Remote;   Good  Judgement:  Good  Insight:  Good  Psychomotor Activity:  Normal  Concentration:  Concentration: Fair and Attention Span: Fair  Recall:  [place] of Knowledge: Good  Language: Good  Akathisia:  No  Handed:  Right  AIMS (if indicated): not done  Assets:  Communication Skills Desire for Improvement Housing Resilience Social Support  ADL's:  Intact  Cognition: WNL  Sleep:  Fair   Screenings: PHQ2-9     Office Visit from 12/10/2017 in [facility] at [place] Visit from 03/01/2016 in [place] at [facility]  PHQ-2 Total Score  4  4  PHQ-9 Total Score  8  6       Assessment and Plan: Polar disorder type I.  Generalized anxiety disorder.  Rule out alcohol use.  Review her blood work results which was done in August 2019.  Now she agree to try a different mood stabilizer since her creatinine is 1.51 and potassium is 5.7.  I recommended to try Tegretol  100 mg daily and reduce lithium to 300 mg at bedtime.  If she able to tolerate Tegretol better then we will slowly increase the dose of Tegretol and discontinue lithium.  Patient agreed with the plan.  Patient has allergic reaction with Lamictal and I reminded that if she had any side effects or reaction to Tegretol then she should call us immediately.  I will continue Seroquel 200 mg at bedtime, Prozac 40 mg daily.  Also discussed that she should stop drinking completely as it may cause interaction with psychotropic medication.  Recommended to call us back if she is any question or any concern.  Follow-up in 6 weeks.   Kathlee Nations, MD 11/27/2018, 11:53 AM

## 2018-12-05 DIAGNOSIS — R092 Respiratory arrest: Secondary | ICD-10-CM | POA: Diagnosis not present

## 2018-12-05 DIAGNOSIS — J449 Chronic obstructive pulmonary disease, unspecified: Secondary | ICD-10-CM | POA: Diagnosis not present

## 2018-12-10 ENCOUNTER — Other Ambulatory Visit: Payer: Self-pay | Admitting: Family

## 2019-01-05 DIAGNOSIS — R0902 Hypoxemia: Secondary | ICD-10-CM | POA: Diagnosis not present

## 2019-01-05 DIAGNOSIS — J449 Chronic obstructive pulmonary disease, unspecified: Secondary | ICD-10-CM | POA: Diagnosis not present

## 2019-01-08 ENCOUNTER — Other Ambulatory Visit: Payer: Self-pay | Admitting: Family

## 2019-01-08 ENCOUNTER — Ambulatory Visit (HOSPITAL_COMMUNITY): Payer: Medicare Other | Admitting: Psychiatry

## 2019-01-08 NOTE — Telephone Encounter (Signed)
30 day refill sent.  Pt is due for follow up visit. Please contact pt to schedule virtual visit.

## 2019-01-12 ENCOUNTER — Encounter (HOSPITAL_COMMUNITY): Payer: Self-pay | Admitting: Psychiatry

## 2019-01-12 ENCOUNTER — Other Ambulatory Visit: Payer: Self-pay

## 2019-01-12 ENCOUNTER — Ambulatory Visit (INDEPENDENT_AMBULATORY_CARE_PROVIDER_SITE_OTHER): Payer: Medicare Other | Admitting: Psychiatry

## 2019-01-12 DIAGNOSIS — F3131 Bipolar disorder, current episode depressed, mild: Secondary | ICD-10-CM | POA: Diagnosis not present

## 2019-01-12 MED ORDER — CARBAMAZEPINE ER 100 MG PO TB12: 100.0000 mg | ORAL_TABLET | Freq: Two times a day (BID) | ORAL | 1 refills | Status: DC

## 2019-01-12 MED ORDER — QUETIAPINE FUMARATE 200 MG PO TABS: ORAL_TABLET | ORAL | 0 refills | Status: DC

## 2019-01-12 NOTE — Progress Notes (Signed)
Virtual Visit via Telephone Note  I connected with Kathryn Buckley on 01/12/19 at  3:40 PM EDT by telephone and verified that I am speaking with the correct person using two identifiers.   I discussed the limitations, risks, security and privacy concerns of performing an evaluation and management service by telephone and the availability of in person appointments. I also discussed with the patient that there may be a patient responsible charge related to this service. The patient expressed understanding and agreed to proceed.   History of Present Illness: Patient was evaluated through phone session.  On her last visit we reduce her lithium and started on Tegretol due to high creatinine and potassium.  She is tolerating Tegretol without any side effects.  She admitted her mood is stable and she denies any mania, psychosis or any hallucination.  She is sleeping good.  She is very pleased because her sister and brother-in-law is currently living with her and she is hoping to move in with them on May 1.  She denies any crying spells or any feeling of hopelessness.  She has no tremors, rash or any itching.  She is scheduled to see her primary care physician Dr. Conley Canal in few weeks but not sure if appointment will occur due to pandemic coronavirus.  She is staying most of the time at home due to her chronic breathing issues.  Currently level is okay.  She walks almost every day.  She admitted continue to drink but significantly cut down from the past.  She denies intoxication or any blackouts.  She like to continue Seroquel, Prozac and Tegretol.    Past Psychiatric History: Reviewed. H/O bipolar disorder. Started treatment in this office since 2008. Tried Lexapro, Cymbalta, Paxil, Zoloft, Geodon and Lamictal (rash). No h/o suicidal attempt.Tried Celexa worked very well but it was stopped due to interaction with Nexium.   Observations/Objective: Mental status examination done on the phone.   Patient describes her mood is good.  She is pleasant during conversation.  Her speech is slow but clear and coherent.  Her thought process logical.  There were no delusions, paranoia.  She denies any auditory or visual hallucination.  She denies any active or passive suicidal thoughts or homicidal thought.  Her attention concentration is fair.  She has some difficulty remembering things but she is alert and oriented x3.  Her cognition is fair.  Her fund of knowledge is average.  Her insight judgment is okay.    Assessment and Plan: Bipolar disorder type I.  Generalized anxiety disorder.  Rule out alcohol use.  Patient is tolerating Tegretol better.  Recommended to discontinue lithium and try Tegretol 100 mg twice a day to help her mood lability.  Continue Seroquel 200 mg at bedtime and Prozac 40 mg daily.  Discussed her alcohol use.  She had cut down drinking from the past but is still drink a few beers 2-3 times a week but denies any intoxication.  Patient is not interested in therapy.  We discussed that she should stop drinking completely as it may interfere with her psychotropic medication.  She agreed and she is working on it.  I recommended to call us back if she has any question or any concern.  Follow-up in 3 months.  Follow Up Instructions:    I discussed the assessment and treatment plan with the patient. The patient was provided an opportunity to ask questions and all were answered. The patient agreed with the plan and demonstrated an understanding of  the instructions.   The patient was advised to call back or seek an in-person evaluation if the symptoms worsen or if the condition fails to improve as anticipated.  I provided 20 minutes of non-face-to-face time during this encounter.   Kathlee Nations, MD

## 2019-01-13 NOTE — Telephone Encounter (Signed)
Lm for patient to call back and schedule virtual visit

## 2019-02-04 DIAGNOSIS — J449 Chronic obstructive pulmonary disease, unspecified: Secondary | ICD-10-CM | POA: Diagnosis not present

## 2019-02-04 DIAGNOSIS — R0902 Hypoxemia: Secondary | ICD-10-CM | POA: Diagnosis not present

## 2019-02-12 ENCOUNTER — Ambulatory Visit (INDEPENDENT_AMBULATORY_CARE_PROVIDER_SITE_OTHER): Payer: Medicare Other | Admitting: Family

## 2019-02-12 ENCOUNTER — Other Ambulatory Visit: Payer: Self-pay

## 2019-02-12 ENCOUNTER — Telehealth: Payer: Self-pay | Admitting: Family

## 2019-02-12 DIAGNOSIS — E119 Type 2 diabetes mellitus without complications: Secondary | ICD-10-CM

## 2019-02-12 DIAGNOSIS — E039 Hypothyroidism, unspecified: Secondary | ICD-10-CM

## 2019-02-12 DIAGNOSIS — I10 Essential (primary) hypertension: Secondary | ICD-10-CM

## 2019-02-12 DIAGNOSIS — Z5181 Encounter for therapeutic drug level monitoring: Secondary | ICD-10-CM

## 2019-02-12 DIAGNOSIS — E785 Hyperlipidemia, unspecified: Secondary | ICD-10-CM | POA: Diagnosis not present

## 2019-02-12 MED ORDER — LEVOTHYROXINE SODIUM 137 MCG PO TABS: 137.0000 ug | ORAL_TABLET | Freq: Every day | ORAL | 5 refills | Status: DC

## 2019-02-12 MED ORDER — OMEPRAZOLE 40 MG PO CPDR: 40.0000 mg | DELAYED_RELEASE_CAPSULE | Freq: Every day | ORAL | 1 refills | Status: DC

## 2019-02-12 MED ORDER — ATORVASTATIN CALCIUM 40 MG PO TABS: ORAL_TABLET | ORAL | 1 refills | Status: DC

## 2019-02-12 MED ORDER — AMLODIPINE BESYLATE 5 MG PO TABS: ORAL_TABLET | ORAL | 1 refills | Status: DC

## 2019-02-12 NOTE — Progress Notes (Signed)
Virtual Visit via Telephone Note  I connected with Kathryn Buckley on 02/12/19 at  8:00 AM EDT by telephone and verified that I am speaking with the correct person using two identifiers. This method was chosen because of the COVID-19 pandemic/social distancing and because pt does not have equipment to complete a video visit.   Location: Patient: home Provider: office   I discussed the limitations, risks, security and privacy concerns of performing an evaluation and management service by telephone and the availability of in person appointments. I also discussed with the patient that there may be a patient responsible charge related to this service. The patient expressed understanding and agreed to proceed.   History of Present Illness:  Patient is a 68 yr old female who presents today for routine follow up.  HTN- she is maintained on amlodipine 5mg .  No edema today.  Not checking blood pressure regularly. Tried to use an old machine of her husband's but it did not appear to be functioning properly.   BP Readings from Last 3 Encounters:  05/04/18 124/78  12/10/17 (!) 158/82  04/30/17 (!) 154/90   DM2- reports that she rarely checks her sugar.   Lab Results  Component Value Date   HGBA1C 5.7 05/04/2018   HGBA1C 5.6 12/10/2017   HGBA1C 5.1 04/30/2017   Lab Results  Component Value Date   LDLCALC 79 12/10/2017   CREATININE 1.51 (H) 05/04/2018   Hypothyroid- It appears that she never decreased her synthroid from 137 to 125 mcg as instructed last visit.   Lab Results  Component Value Date   TSH 0.28 (L) 05/04/2018   Depression- followed by psychiatry. Reports that psychiatry changed her from lithium and placed her on tegretol. She notes some anxiety related to coronovirus.  Hyperlipidemia- continues lipitor.   Lab Results  Component Value Date   CHOL 156 12/10/2017   HDL 62.10 12/10/2017   LDLCALC 79 12/10/2017   LDLDIRECT 134.0 03/01/2016   TRIG 79.0 12/10/2017   CHOLHDL 3 12/10/2017   GERD- reports sxs were not controlled on 20mg  of omeprazole. Therefore she increased to 40mg .    Observations/Objective:   Gen: Awake, alert, no acute distress Resp: Breathing is even and non-labored Psych: calm/pleasant demeanor Neuro: Alert and Oriented x 3, + facial symmetry, speech is clear.    Assessment and Plan:  1)  Hyperlipidemia- stable on statin. Continue same.  2) gerd- improved on 40mg  of omeprazole. Rx has been sent.   3)  Depression- stable on current meds. She states that Dr. Adele Schilder wants her to have follow up blood work. Will obtain follow up tegretol level.  4) HTN- clinically stable on amlodipine.  Continue same.  5) Hypothyroid- obtain follow up TSH, adjust synthroid pending results.   6) DM2- clinically stable. Obtain follow up A1C.  7) hyperkalemia- pt never returned for follow up bmet following last visit. Will repeat bmet.    Follow Up Instructions:    I discussed the assessment and treatment plan with the patient. The patient was provided an opportunity to ask questions and all were answered. The patient agreed with the plan and demonstrated an understanding of the instructions.   The patient was advised to call back or seek an in-person evaluation if the symptoms worsen or if the condition fails to improve as anticipated.  I provided 21 minutes of non-face-to-face time during this encounter.   Nance Pear, NP

## 2019-02-12 NOTE — Telephone Encounter (Signed)
Return for in the next few weeks for Lab visit. 3 Months for a face to face visit with Melissa. Left msg to  Call back

## 2019-03-07 DIAGNOSIS — R0902 Hypoxemia: Secondary | ICD-10-CM | POA: Diagnosis not present

## 2019-03-07 DIAGNOSIS — J449 Chronic obstructive pulmonary disease, unspecified: Secondary | ICD-10-CM | POA: Diagnosis not present

## 2019-03-11 ENCOUNTER — Other Ambulatory Visit (HOSPITAL_COMMUNITY): Payer: Self-pay

## 2019-03-11 DIAGNOSIS — F411 Generalized anxiety disorder: Secondary | ICD-10-CM

## 2019-03-11 DIAGNOSIS — F3131 Bipolar disorder, current episode depressed, mild: Secondary | ICD-10-CM

## 2019-03-11 MED ORDER — FLUOXETINE HCL 40 MG PO CAPS: 40.0000 mg | ORAL_CAPSULE | Freq: Every day | ORAL | 0 refills | Status: DC

## 2019-03-25 ENCOUNTER — Other Ambulatory Visit (HOSPITAL_COMMUNITY): Payer: Self-pay

## 2019-03-25 DIAGNOSIS — F3131 Bipolar disorder, current episode depressed, mild: Secondary | ICD-10-CM

## 2019-03-25 MED ORDER — CARBAMAZEPINE ER 100 MG PO TB12: 100.0000 mg | ORAL_TABLET | Freq: Two times a day (BID) | ORAL | 0 refills | Status: DC

## 2019-04-06 DIAGNOSIS — J449 Chronic obstructive pulmonary disease, unspecified: Secondary | ICD-10-CM | POA: Diagnosis not present

## 2019-04-06 DIAGNOSIS — R0902 Hypoxemia: Secondary | ICD-10-CM | POA: Diagnosis not present

## 2019-04-09 DIAGNOSIS — Z950 Presence of cardiac pacemaker: Secondary | ICD-10-CM | POA: Diagnosis not present

## 2019-04-16 ENCOUNTER — Ambulatory Visit (HOSPITAL_COMMUNITY): Payer: Medicare Other | Admitting: Psychiatry

## 2019-04-21 ENCOUNTER — Other Ambulatory Visit: Payer: Self-pay

## 2019-04-21 ENCOUNTER — Encounter (HOSPITAL_COMMUNITY): Payer: Self-pay | Admitting: Psychiatry

## 2019-04-21 ENCOUNTER — Ambulatory Visit (INDEPENDENT_AMBULATORY_CARE_PROVIDER_SITE_OTHER): Payer: Medicare Other | Admitting: Psychiatry

## 2019-04-21 DIAGNOSIS — F3131 Bipolar disorder, current episode depressed, mild: Secondary | ICD-10-CM

## 2019-04-21 DIAGNOSIS — F411 Generalized anxiety disorder: Secondary | ICD-10-CM

## 2019-04-21 MED ORDER — QUETIAPINE FUMARATE 300 MG PO TABS: ORAL_TABLET | ORAL | 0 refills | Status: DC

## 2019-04-21 MED ORDER — FLUOXETINE HCL 40 MG PO CAPS: 40.0000 mg | ORAL_CAPSULE | Freq: Every day | ORAL | 0 refills | Status: DC

## 2019-04-21 MED ORDER — CARBAMAZEPINE ER 100 MG PO TB12: 100.0000 mg | ORAL_TABLET | Freq: Two times a day (BID) | ORAL | 0 refills | Status: DC

## 2019-04-21 NOTE — Progress Notes (Signed)
Virtual Visit via Telephone Note  I connected with Kathryn Buckley on 04/21/19 at  3:40 PM EDT by telephone and verified that I am speaking with the correct person using two identifiers.   I discussed the limitations, risks, security and privacy concerns of performing an evaluation and management service by telephone and the availability of in person appointments. I also discussed with the patient that there may be a patient responsible charge related to this service. The patient expressed understanding and agreed to proceed.   History of Present Illness: Patient was evaluated through phone session.  She is no longer taking lithium.  We have increased Tegretol 100 mg twice a day.  She admitted to struggle with anxiety, irritability and poor sleep.  She admitted racing thoughts and sometimes having manic-like symptoms.  She also admitted there are some nights when she take extra Seroquel which helps her a lot.  She has no tremors, shakes or any EPS.  She is not happy with the current pandemic as she does not leave the house and is staying with her brother-in-law and sister.  Patient told they are very supporting but she feels sometimes that she should have her own place.  Patient reported no tremors, shakes or any EPS.  Patient recently had virtual visit with her primary care physician and she had ordered labs which are not drawn yet.  Patient also proud that she had stopped drinking completely since June 1.  In the beginning she has some difficulty but now she is adjusting well without alcohol.  Her appetite is okay.  She reported her weight is stable.  She denies any illegal substance use.    Past Psychiatric History:Reviewed. H/Obipolar disorder. Started treatment in this office since 2008. Tried Lexapro, Cymbalta, Paxil, Zoloft, Geodon and Lamictal (rash). Lithium helped but stopped due to high creatinine. No h/osuicidal attempt.Tried Celexa worked very well but it was stopped due to  interaction with Nexium.  Psychiatric Specialty Exam: Physical Exam  ROS  Last menstrual period 09/23/1988.There is no height or weight on file to calculate BMI.  General Appearance: NA  Eye Contact:  NA  Speech:  Normal Rate  Volume:  Normal  Mood:  Anxious and Dysphoric  Affect:  NA  Thought Process:  Goal Directed  Orientation:  Full (Time, Place, and Person)  Thought Content:  Rumination  Suicidal Thoughts:  No  Homicidal Thoughts:  No  Memory:  Immediate;   Good Recent;   Good Remote;   Good  Judgement:  Good  Insight:  Good  Psychomotor Activity:  NA  Concentration:  Concentration: Fair and Attention Span: Fair  Recall:  Good  Fund of Knowledge:  Good  Language:  Good  Akathisia:  No  Handed:  Right  AIMS (if indicated):     Assets:  Communication Skills Desire for Improvement Housing Resilience Social Support  ADL's:  Intact  Cognition:  WNL  Sleep:   fair      Assessment and Plan: Bipolar disorder type I.  Generalized anxiety disorder.  History of alcohol use.  Discussed not to take extra Seroquel without doctor's permission and stay on the current medication.  Patient agreed with the plan.  I will increase Seroquel dose to 300 mg at bedtime.  Continue Prozac 40 mg daily and Tegretol 100 mg twice a day.  Discussed medication side effect specially if current medicine did not help then we may consider adjusting the dose or try a new medication.  Patient did well on  lithium however due to high creatinine it was discontinued.  Patient is not interested in therapy.  Recommended to call us back if she has any question or any concern.  Congratulations given to stopping alcohol.  Of her if she need any help including AA meeting or group therapy then she should let us know.  Patient promised to give [place] a call back.  Follow-up in 3 months.  Follow Up Instructions:    I discussed the assessment and treatment plan with the patient. The patient was provided an  opportunity to ask questions and all were answered. The patient agreed with the plan and demonstrated an understanding of the instructions.   The patient was advised to call back or seek an in-person evaluation if the symptoms worsen or if the condition fails to improve as anticipated.  I provided 20 minutes of non-face-to-face time during this encounter.   Kathlee Nations, MD

## 2019-05-07 DIAGNOSIS — J449 Chronic obstructive pulmonary disease, unspecified: Secondary | ICD-10-CM | POA: Diagnosis not present

## 2019-05-07 DIAGNOSIS — R0902 Hypoxemia: Secondary | ICD-10-CM | POA: Diagnosis not present

## 2019-05-19 ENCOUNTER — Encounter: Payer: Medicare Other | Admitting: Family

## 2019-05-24 ENCOUNTER — Other Ambulatory Visit: Payer: Self-pay

## 2019-05-25 ENCOUNTER — Encounter: Payer: Self-pay | Admitting: Family

## 2019-05-25 ENCOUNTER — Ambulatory Visit (INDEPENDENT_AMBULATORY_CARE_PROVIDER_SITE_OTHER): Payer: Medicare Other | Admitting: Family

## 2019-05-25 VITALS — BP 110/61 | HR 87 | Temp 97.4°F | Resp 16 | Ht 70.0 in | Wt 159.0 lb

## 2019-05-25 DIAGNOSIS — E785 Hyperlipidemia, unspecified: Secondary | ICD-10-CM | POA: Diagnosis not present

## 2019-05-25 DIAGNOSIS — Z0000 Encounter for general adult medical examination without abnormal findings: Secondary | ICD-10-CM | POA: Diagnosis not present

## 2019-05-25 DIAGNOSIS — Z23 Encounter for immunization: Secondary | ICD-10-CM | POA: Diagnosis not present

## 2019-05-25 DIAGNOSIS — E039 Hypothyroidism, unspecified: Secondary | ICD-10-CM | POA: Diagnosis not present

## 2019-05-25 DIAGNOSIS — I10 Essential (primary) hypertension: Secondary | ICD-10-CM | POA: Diagnosis not present

## 2019-05-25 DIAGNOSIS — F319 Bipolar disorder, unspecified: Secondary | ICD-10-CM | POA: Diagnosis not present

## 2019-05-25 DIAGNOSIS — Z5181 Encounter for therapeutic drug level monitoring: Secondary | ICD-10-CM

## 2019-05-25 LAB — COMPREHENSIVE METABOLIC PANEL
ALT: 6 U/L (ref 0–35)
AST: 9 U/L (ref 0–37)
Albumin: 4.1 g/dL (ref 3.5–5.2)
Alkaline Phosphatase: 90 U/L (ref 39–117)
BUN: 18 mg/dL (ref 6–23)
CO2: 25 mEq/L (ref 19–32)
Calcium: 8.4 mg/dL (ref 8.4–10.5)
Chloride: 111 mEq/L (ref 96–112)
Creatinine, Ser: 1.56 mg/dL — ABNORMAL HIGH (ref 0.40–1.20)
GFR: 32.89 mL/min — ABNORMAL LOW (ref >60.00)
Glucose, Bld: 148 mg/dL — ABNORMAL HIGH (ref 70–99)
Potassium: 4.2 mEq/L (ref 3.5–5.1)
Sodium: 144 mEq/L (ref 135–145)
Total Bilirubin: 0.3 mg/dL (ref 0.2–1.2)
Total Protein: 6.2 g/dL (ref 6.0–8.3)

## 2019-05-25 LAB — LIPID PANEL
Cholesterol: 156 mg/dL (ref 0–200)
HDL: 43.9 mg/dL (ref >39.00)
LDL Cholesterol: 74 mg/dL (ref 0–99)
NonHDL: 112.28
Total CHOL/HDL Ratio: 4
Triglycerides: 190 mg/dL — ABNORMAL HIGH (ref 0.0–149.0)
VLDL: 38 mg/dL (ref 0.0–40.0)

## 2019-05-25 LAB — TSH: TSH: 0.17 u[IU]/mL — ABNORMAL LOW (ref 0.35–4.50)

## 2019-05-25 MED ORDER — SHINGRIX 50 MCG/0.5ML IM SUSR: INTRAMUSCULAR | 1 refills | Status: DC

## 2019-05-25 NOTE — Patient Instructions (Signed)
Call me when you get settle in [facility] and let me know where you would like to be referred for colonoscopy, mammogram and bone density and we will send the orders.   Complete lab work prior to leaving.

## 2019-05-25 NOTE — Progress Notes (Signed)
Subjective:    Patient ID: Kathryn Buckley, female    DOB: [birth year], 68 y.o.   MRN: [id number]  HPI  Patient is a 68 yr old female who presents today for follow up.  HTN- maintained on amlodipine 5mg .Denies LE edema. Has chronic so which is at normal.  Will move to [facility] to live with his son.   BP Readings from Last 3 Encounters:  05/25/19 110/61  05/04/18 124/78  12/10/17 (!) 158/82   Hypothyroid- maintained on synthroid 137 mcg. Reports feeling well on synthroid.   Lab Results  Component Value Date   TSH 0.28 (L) 05/04/2018   GERD- maintained on omeprazole 40mg . Has occasional breakthrough symptoms.  Sometimes related to dietary indiscriation.   Bipolar disorder- follows with Dr. Adele Schilder. Reports stable mood.    Hyperlipidemia- maintained on lipitor.  Lab Results  Component Value Date   CHOL 156 12/10/2017   HDL 62.10 12/10/2017   LDLCALC 79 12/10/2017   LDLDIRECT 134.0 03/01/2016   TRIG 79.0 12/10/2017   CHOLHDL 3 12/10/2017   COPD- on oxygen 2.5 liters. She sleeps with it and prn during the day.   Patient presents today for complete physical.  Immunizations: tdap 2014 Diet: healthy Exercise: limited due to COPD Colonoscopy: declines Dexa: declines Mammogram:    Review of Systems  Constitutional: Negative for unexpected weight change.  HENT: Negative for hearing loss (some hearing loss) and rhinorrhea.   Eyes: Negative for visual disturbance.  Respiratory: Positive for shortness of breath (at baseline). Negative for cough.   Cardiovascular: Negative for chest pain.  Gastrointestinal: Negative for blood in stool, constipation and diarrhea.  Genitourinary: Positive for frequency (attributes to drinking lots of water). Negative for dysuria and hematuria.  Musculoskeletal: Negative for arthralgias.  Neurological: Negative for headaches.  Hematological: Negative for adenopathy.  Psychiatric/Behavioral:       Denies anxiety/depression        Past  Medical History:  Diagnosis Date  . COPD (chronic obstructive pulmonary disease) ([place])   . Depression   . HTN (hypertension)   . Hyperlipemia   . Seizures ([place])    Due to brain tumor that was removed in 2004  . Thyroid disease      Social History   Socioeconomic History  . Marital status: Married    Spouse name: Not on file  . Number of children: Not on file  . Years of education: Not on file  . Highest education level: Not on file  Occupational History  . Not on file  Social Needs  . Financial resource strain: Not on file  . Food insecurity    Worry: Not on file    Inability: Not on file  . Transportation needs    Medical: Not on file    Non-medical: Not on file  Tobacco Use  . Smoking status: Current Every Day Smoker    Packs/day: 2.50    Years: 40.00    Pack years: 100.00    Types: Cigarettes    Last attempt to quit: 03/14/2016    Years since quitting: 3.1  . Smokeless tobacco: Never Used  . Tobacco comment: States interest in Chantix. Trying to quit.   Substance and Sexual Activity  . Alcohol use: Yes    Alcohol/week: 2.0 - 3.0 standard drinks    Types: 2 - 3 Cans of beer per week    Comment: Occasional beer  . Drug use: No  . Sexual activity: Not Currently  Lifestyle  . Physical activity  Days per week: Not on file    Minutes per session: Not on file  . Stress: Not on file  Relationships  . Social Herbalist on phone: Not on file    Gets together: Not on file    Attends religious service: Not on file    Active member of club or organization: Not on file    Attends meetings of clubs or organizations: Not on file    Relationship status: Not on file  . Intimate partner violence    Fear of current or ex partner: Not on file    Emotionally abused: Not on file    Physically abused: Not on file    Forced sexual activity: Not on file  Other Topics Concern  . Not on file  Social History Narrative   Lives with husband and 2 chihuahua   She has  2 children- one son died of drug overdose   1 living son- [name]- lives in [place].     2 step sons   She has worked in the past in Research officer, trade union)   She enjoys TV- likes to be home.             Past Surgical History:  Procedure Laterality Date  . BRAIN SURGERY      Family History  Problem Relation Age of Onset  . Bipolar disorder Father   . Heart disease Father   . Cancer Father        prostate  . Heart disease Mother   . Hypertension Brother   . Diabetes Brother   . Stroke Neg Hx   . Hyperlipidemia Neg Hx     Allergies  Allergen Reactions  . Lamictal [Lamotrigine] Rash  . Codeine     Other reaction(s): GI Upset (intolerance)    Current Outpatient Medications on File Prior to Visit  Medication Sig Dispense Refill  . amLODipine (NORVASC) 5 MG tablet TAKE 1 TABLET BY MOUTH DAILY 90 tablet 1  . atorvastatin (LIPITOR) 40 MG tablet TAKE 1 TABLET BY MOUTH EVERY DAY 90 tablet 1  . carbamazepine (TEGRETOL XR) 100 MG 12 hr tablet Take 1 tablet (100 mg total) by mouth 2 (two) times daily. 180 tablet 0  . FLUoxetine (PROZAC) 40 MG capsule Take 1 capsule (40 mg total) by mouth daily. 90 capsule 0  . glucose blood (ONE TOUCH ULTRA TEST) test strip Use as instructed to check blood sugar once a day. 100 each 1  . Lancets (ONETOUCH ULTRASOFT) lancets Use as instructed to check blood sugar once a day. 100 each 1  . levothyroxine (SYNTHROID) 137 MCG tablet Take 1 tablet (137 mcg total) by mouth daily before breakfast. 30 tablet 5  . omeprazole (PRILOSEC) 40 MG capsule Take 1 capsule (40 mg total) by mouth daily. 90 capsule 1  . QUEtiapine (SEROQUEL) 300 MG tablet TAKE 1 TABLET BY MOUTH AT BEDTIME 90 tablet 0   No current facility-administered medications on file prior to visit.     BP 110/61 (BP Location: Right Arm, Patient Position: Sitting, Cuff Size: Small)   Pulse 87   Temp (!) 97.4 F (36.3 C) (Temporal)   Resp 16   Ht 5\' 10"  (1.778 m)   Wt 159 lb (72.1  kg)   LMP 09/23/1988   SpO2 98%   BMI 22.81 kg/m    Objective:   Physical Exam Constitutional:      Appearance: She is well-developed.  Neck:     Musculoskeletal:  Neck supple.     Thyroid: No thyromegaly.  Cardiovascular:     Rate and Rhythm: Normal rate and regular rhythm.     Heart sounds: Normal heart sounds. No murmur.  Pulmonary:     Effort: Pulmonary effort is normal. No respiratory distress.     Breath sounds: Normal breath sounds. No wheezing.  Abdominal:     General: Bowel sounds are normal.     Palpations: Abdomen is soft.     Tenderness: There is no abdominal tenderness.  Skin:    General: Skin is warm and dry.  Neurological:     Mental Status: She is alert and oriented to person, place, and time.  Psychiatric:        Behavior: Behavior normal.        Thought Content: Thought content normal.        Judgment: Judgment normal.   breast: normal breast exam bilaterally Pelvic: deferred        Assessment & Plan:  Preventative care- flu shot today. Tetanus up to date. Pt given printed rx for her to bring to [facility] where she is moving to begin shingrix series. She is due for colo, mammo and dexa. Declines at this time. She would like to complete after she moves. I have asked her to call me with information on where she would like these tests completed and we can send orders after she moves.  Hypothyroid- obtain follow up TSH. Clinically stable.  HTN- bp stable on current medication. Continue same.    GERD- stable on ppi, continue same.   Hyperlipidemia- tolerating statin, obtain follow up lipid panel    Depression- requesting tegretol level to be drawn for her psychiatrist.

## 2019-05-26 ENCOUNTER — Telehealth: Payer: Self-pay | Admitting: Family

## 2019-05-26 DIAGNOSIS — E039 Hypothyroidism, unspecified: Secondary | ICD-10-CM

## 2019-05-26 LAB — CARBAMAZEPINE LEVEL, TOTAL: Carbamazepine Lvl: 4 mg/L (ref 4.0–12.0)

## 2019-05-26 MED ORDER — LEVOTHYROXINE SODIUM 125 MCG PO TABS: 125.0000 ug | ORAL_TABLET | Freq: Every day | ORAL | 3 refills | Status: DC

## 2019-05-26 NOTE — Telephone Encounter (Signed)
Please advise pt that lab work shows that her thyroid medication is too strong.  I would like her to decrease synthroid from 14mcg to 125 mcg. Repeat tsh in 6 weeks.   Her triglycerides are mildly elevated. Please work on avoiding concentrated sweets, and limiting white carbs (rice/bread/pasta/potatoes). Instead substitute whole grain versions with reasonable portions.  Tegretol level looks good.

## 2019-05-26 NOTE — Telephone Encounter (Signed)
Lvm for patient to call back for this results

## 2019-05-27 NOTE — Telephone Encounter (Signed)
Patient advised of results and medication change. She reported she is moving to [place] and will continue care there. I advised her to find a new pcp as soon as she gets there and let them know she is trying a new dose of Synthroid and needs testing in 6 weeks.

## 2019-06-07 DIAGNOSIS — J449 Chronic obstructive pulmonary disease, unspecified: Secondary | ICD-10-CM | POA: Diagnosis not present

## 2019-06-07 DIAGNOSIS — R0902 Hypoxemia: Secondary | ICD-10-CM | POA: Diagnosis not present

## 2019-06-18 ENCOUNTER — Telehealth: Payer: Self-pay

## 2019-06-18 NOTE — Telephone Encounter (Signed)
Copied from [place] [id number]. Topic: Quick Communication - See Telephone Encounter >> Jun 17, 2019  2:34 PM Loma [place] wrote: CRM for notification. See Telephone encounter for: 06/17/19. Pt has moved to [facility], was with [facility] and they had said before she left they serviced the area and when she got there they do not. [facility] [place] will service. # are [phone] Marzetta Board)  (fax) [phone] verbal order/last office note. Please reach to them  for pt to get service. Pt is getting oxygen, but can not go anywhere.  Lvm for patient to call back about this.

## 2019-06-21 NOTE — Telephone Encounter (Signed)
Pt is returning call to nurse.  °

## 2019-06-21 NOTE — Telephone Encounter (Signed)
Patient new address is [address] [place] [address]

## 2019-06-21 NOTE — Telephone Encounter (Signed)
Called [facility] and was advised by Laddie Aquas to fax patient's information for review. Patient's insurance information, last ov note and new address and phone number faxed to [facility], patient advised to contact then later today,

## 2019-06-23 ENCOUNTER — Telehealth: Payer: Self-pay

## 2019-06-23 NOTE — Telephone Encounter (Signed)
Called patient to day to inform her that [facility] home health in [place] is requesting patient to have a 6 minute walking test with Oxygen check to be able to provide oxygen supply for patient.  She has moved to [facility] and is not coming back, we were trying to get her oxygen supply started at this agency until she gets a new pcp.  Advised patient she needs to get a new pcp asap in order to be eligible for oxygen with this new company in [facility].  She verbalized understanding and will try to get stablished soon.  She will call us back if she needs anything from [place].

## 2019-06-30 ENCOUNTER — Other Ambulatory Visit (HOSPITAL_COMMUNITY): Payer: Self-pay

## 2019-06-30 DIAGNOSIS — F3131 Bipolar disorder, current episode depressed, mild: Secondary | ICD-10-CM

## 2019-06-30 MED ORDER — CARBAMAZEPINE ER 100 MG PO TB12: 100.0000 mg | ORAL_TABLET | Freq: Two times a day (BID) | ORAL | 0 refills | Status: DC

## 2019-06-30 MED ORDER — QUETIAPINE FUMARATE 300 MG PO TABS: ORAL_TABLET | ORAL | 0 refills | Status: DC

## 2019-07-06 DIAGNOSIS — E782 Mixed hyperlipidemia: Secondary | ICD-10-CM | POA: Diagnosis not present

## 2019-07-06 DIAGNOSIS — I10 Essential (primary) hypertension: Secondary | ICD-10-CM | POA: Diagnosis not present

## 2019-07-06 DIAGNOSIS — K219 Gastro-esophageal reflux disease without esophagitis: Secondary | ICD-10-CM | POA: Diagnosis not present

## 2019-07-06 DIAGNOSIS — J439 Emphysema, unspecified: Secondary | ICD-10-CM | POA: Diagnosis not present

## 2019-07-07 DIAGNOSIS — R0902 Hypoxemia: Secondary | ICD-10-CM | POA: Diagnosis not present

## 2019-07-07 DIAGNOSIS — J449 Chronic obstructive pulmonary disease, unspecified: Secondary | ICD-10-CM | POA: Diagnosis not present

## 2019-07-08 DIAGNOSIS — J439 Emphysema, unspecified: Secondary | ICD-10-CM | POA: Diagnosis not present

## 2019-07-21 ENCOUNTER — Encounter (HOSPITAL_COMMUNITY): Payer: Self-pay | Admitting: Psychiatry

## 2019-07-21 ENCOUNTER — Ambulatory Visit (INDEPENDENT_AMBULATORY_CARE_PROVIDER_SITE_OTHER): Payer: Medicare Other | Admitting: Psychiatry

## 2019-07-21 ENCOUNTER — Other Ambulatory Visit: Payer: Self-pay

## 2019-07-21 DIAGNOSIS — F3131 Bipolar disorder, current episode depressed, mild: Secondary | ICD-10-CM

## 2019-07-21 DIAGNOSIS — F411 Generalized anxiety disorder: Secondary | ICD-10-CM

## 2019-07-21 MED ORDER — QUETIAPINE FUMARATE 300 MG PO TABS: ORAL_TABLET | ORAL | 0 refills | Status: DC

## 2019-07-21 MED ORDER — FLUOXETINE HCL 40 MG PO CAPS: 40.0000 mg | ORAL_CAPSULE | Freq: Every day | ORAL | 0 refills | Status: DC

## 2019-07-21 MED ORDER — CARBAMAZEPINE ER 100 MG PO TB12: 100.0000 mg | ORAL_TABLET | Freq: Two times a day (BID) | ORAL | 0 refills | Status: DC

## 2019-07-21 NOTE — Progress Notes (Signed)
Virtual Visit via Telephone Note  I connected with Kathryn Buckley on 07/21/19 at  3:40 PM EDT by telephone and verified that I am speaking with the correct person using two identifiers.   I discussed the limitations, risks, security and privacy concerns of performing an evaluation and management service by telephone and the availability of in person appointments. I also discussed with the patient that there may be a patient responsible charge related to this service. The patient expressed understanding and agreed to proceed.   History of Present Illness: Patient was evaluated through phone session.  On her last visit we increase Seroquel and she is doing much better with sleep and she has no longer irritability, anger or depression.  She also very happy as she moved to [facility] to live with her son and his wife and 2 grandkids.  Patient told that she had made the best decision since she was living by herself or with her siblings but is still not happy.  She has not find a psychiatrist but she has a scheduled to see PCP and hoping that PCP will refer her to see a psychiatrist.  She does not want to change the medication.  Recently she had a blood work.  Her Tegretol level is 4.  Her creatinine is the stable 1.56.  Patient reported no tremors, shakes or any EPS.  She admitted once or twice drinking alcohol but denies any binge or any intoxication.  Her energy level is good.  Her mood is lifted.  Her appetite is okay.  She wants to continue current medication which is Seroquel, Tegretol and Prozac.    Past Psychiatric History:Reviewed. H/Obipolar disorder. Started treatment in this office since 2008. Tried Lexapro, Cymbalta, Paxil, Zoloft, Geodon and Lamictal (rash). Lithium helped but stopped due to high creatinine. No h/osuicidal attempt.Tried Celexa worked very well but it was stopped due to interaction with Nexium.  Recent Results (from the past 2160 hour(s))  Lipid panel      Status: Abnormal   Collection Time: 05/25/19  2:00 PM  Result Value Ref Range   Cholesterol 156 0 - 200 mg/dL    Comment: ATP III Classification       Desirable:  < 200 mg/dL               Borderline High:  200 - 239 mg/dL          High:  > = 240 mg/dL   Triglycerides 190.0 (H) 0.0 - 149.0 mg/dL    Comment: Normal:  <150 mg/dLBorderline High:  150 - 199 mg/dL   HDL 43.90 >39.00 mg/dL   VLDL 38.0 0.0 - 40.0 mg/dL   LDL Cholesterol 74 0 - 99 mg/dL   Total CHOL/HDL Ratio 4     Comment:                Men          Women1/2 Average Risk     3.4          3.3Average Risk          5.0          4.42X Average Risk          9.6          7.13X Average Risk          15.0          11.0  NonHDL 112.28     Comment: NOTE:  Non-HDL goal should be 30 mg/dL higher than patient's LDL goal (i.e. LDL goal of < 70 mg/dL, would have non-HDL goal of < 100 mg/dL)  Comp Met (CMET)     Status: Abnormal   Collection Time: 05/25/19  2:00 PM  Result Value Ref Range   Sodium 144 135 - 145 mEq/L   Potassium 4.2 3.5 - 5.1 mEq/L   Chloride 111 96 - 112 mEq/L   CO2 25 19 - 32 mEq/L   Glucose, Bld 148 (H) 70 - 99 mg/dL   BUN 18 6 - 23 mg/dL   Creatinine, Ser 1.56 (H) 0.40 - 1.20 mg/dL   Total Bilirubin 0.3 0.2 - 1.2 mg/dL   Alkaline Phosphatase 90 39 - 117 U/L   AST 9 0 - 37 U/L   ALT 6 0 - 35 U/L   Total Protein 6.2 6.0 - 8.3 g/dL   Albumin 4.1 3.5 - 5.2 g/dL   Calcium 8.4 8.4 - 10.5 mg/dL   GFR 32.89 (L) >60.00 mL/min  TSH     Status: Abnormal   Collection Time: 05/25/19  2:00 PM  Result Value Ref Range   TSH 0.17 (L) 0.35 - 4.50 uIU/mL  Carbamazepine Level (Tegretol), total     Status: None   Collection Time: 05/25/19  2:00 PM  Result Value Ref Range   Carbamazepine Lvl 4.0 4.0 - 12.0 mg/L      Psychiatric Specialty Exam: Physical Exam  ROS  Last menstrual period 09/23/1988.There is no height or weight on file to calculate BMI.  General Appearance: NA  Eye Contact:  NA  Speech:   Clear and Coherent and Normal Rate  Volume:  Normal  Mood:  Euthymic  Affect:  NA  Thought Process:  Goal Directed  Orientation:  Full (Time, Place, and Person)  Thought Content:  WDL and Logical  Suicidal Thoughts:  No  Homicidal Thoughts:  No  Memory:  Immediate;   Good Recent;   Good Remote;   Good  Judgement:  Good  Insight:  Good  Psychomotor Activity:  NA  Concentration:  Concentration: Good and Attention Span: Fair  Recall:  Good  Fund of Knowledge:  Good  Language:  Good  Akathisia:  No  Handed:  Right  AIMS (if indicated):     Assets:  Communication Skills Desire for Improvement Housing Resilience Social Support  ADL's:  Intact  Cognition:  WNL  Sleep:   ok      Assessment and Plan: Bipolar disorder type I.  Generalized anxiety disorder.  Alcohol use.  Patient doing better since she moved to [facility] to live with her son.  She is tolerating very well her increase Seroquel.  She is sleeping better.  She reported no side effects.  We discussed not to drink as it interfere with her psychotropic medication and recovery.  She agree with the plan.  She need to find a psychiatrist in the [facility] but like to have a follow-up appointment until then.  I will continue Seroquel 300 mg at bedtime, Prozac 40 mg daily and Tegretol 100 mg twice a day.  I reviewed blood work results with her.  Recommended to call us back if she has any question of any concern.  Follow-up in 3 months until she find a psychiatrist in [place].  Follow Up Instructions:    I discussed the assessment and treatment plan with the patient. The patient was provided an opportunity  to ask questions and all were answered. The patient agreed with the plan and demonstrated an understanding of the instructions.   The patient was advised to call back or seek an in-person evaluation if the symptoms worsen or if the condition fails to improve as anticipated.  I provided 20 minutes of non-face-to-face  time during this encounter.   Kathlee Nations, MD

## 2019-08-13 ENCOUNTER — Other Ambulatory Visit: Payer: Self-pay

## 2019-08-13 ENCOUNTER — Telehealth: Payer: Self-pay | Admitting: Family

## 2019-08-13 MED ORDER — OMEPRAZOLE 40 MG PO CPDR: 40.0000 mg | DELAYED_RELEASE_CAPSULE | Freq: Every day | ORAL | 1 refills | Status: DC

## 2019-08-13 MED ORDER — ATORVASTATIN CALCIUM 40 MG PO TABS: ORAL_TABLET | ORAL | 1 refills | Status: DC

## 2019-08-13 NOTE — Telephone Encounter (Signed)
Patient requesting 90 day supply omeprazole (PRILOSEC) 40 MG capsule and atorvastatin (LIPITOR) 40 MG tablet, patient states pharmacy reached on on several occasions, patient would like a follow up call when rx is sent  [facility] [place] [facility] (

## 2019-08-13 NOTE — Telephone Encounter (Signed)
Prescriptions have been sent, Lvm for patient to be aware.

## 2019-08-26 ENCOUNTER — Other Ambulatory Visit: Payer: Self-pay | Admitting: Family Medicine

## 2019-08-26 DIAGNOSIS — Z1231 Encounter for screening mammogram for malignant neoplasm of breast: Secondary | ICD-10-CM

## 2019-09-06 ENCOUNTER — Other Ambulatory Visit: Payer: Self-pay | Admitting: Family

## 2019-09-06 MED ORDER — AMLODIPINE BESYLATE 5 MG PO TABS: ORAL_TABLET | ORAL | 1 refills | Status: DC

## 2019-09-06 MED ORDER — LEVOTHYROXINE SODIUM 125 MCG PO TABS: 125.0000 ug | ORAL_TABLET | Freq: Every day | ORAL | 1 refills | Status: DC

## 2019-09-06 NOTE — Telephone Encounter (Signed)
Medication Refill - Medication:  amLODipine (NORVASC) 5 MG tablet  levothyroxine (SYNTHROID) 125 MCG tablet  Has the patient contacted their pharmacy?  Yes advised to call office.  Preferred Pharmacy (with phone number or street name):  [facility] [place] [facility] (  [place], [place] [address]  Phone:  [phone] Fax:  [phone]   Agent: Please be advised that RX refills may take up to 3 business days. We ask that you follow-up with your pharmacy.

## 2019-09-06 NOTE — Telephone Encounter (Signed)
Requested medication (s) are due for refill today: yes  Requested medication (s) are on the active medication list: yes  Last refill:  05/26/2019  Future visit scheduled:no  Notes to clinic: patient need labs Review for refill   Requested Prescriptions  Pending Prescriptions Disp Refills   levothyroxine (SYNTHROID) 125 MCG tablet 30 tablet 3    Sig: Take 1 tablet (125 mcg total) by mouth daily.      Endocrinology:  Hypothyroid Agents Failed - 09/06/2019  2:04 PM      Failed - TSH needs to be rechecked within 3 months after an abnormal result. Refill until TSH is due.      Failed - TSH in normal range and within 360 days    TSH  Date Value Ref Range Status  05/25/2019 0.17 (L) 0.35 - 4.50 uIU/mL Final          Passed - Valid encounter within last 12 months    Recent Outpatient Visits           3 months ago Preventative health care   [facility] [place], NP   6 months ago Essential hypertension   [facility] [place], NP   1 year ago Controlled type 2 diabetes mellitus without complication, without long-term current use of insulin ([place])   [facility] [place], NP   1 year ago Essential hypertension   [facility] [place] [place], NP   2 years ago Controlled type 2 diabetes mellitus without complication, without long-term current use of insulin ([place])   [facility], NP               Signed Prescriptions Disp Refills   amLODipine (NORVASC) 5 MG tablet 90 tablet 1    Sig: TAKE 1 TABLET BY MOUTH DAILY      Cardiovascular:  Calcium Channel Blockers Passed - 09/06/2019  2:04 PM      Passed - Last BP in normal range    BP Readings from Last 1 Encounters:  05/25/19 110/61          Passed - Valid encounter  within last 6 months    Recent Outpatient Visits           3 months ago Preventative health care   [facility], NP   6 months ago Essential hypertension   [facility], NP   1 year ago Controlled type 2 diabetes mellitus without complication, without long-term current use of insulin ([place])   [facility] [place], NP   1 year ago Essential hypertension   [facility] [place], NP   2 years ago Controlled type 2 diabetes mellitus without complication, without long-term current use of insulin ([place])   [facility], NP

## 2019-10-21 ENCOUNTER — Encounter (HOSPITAL_COMMUNITY): Payer: Self-pay | Admitting: Psychiatry

## 2019-10-21 ENCOUNTER — Other Ambulatory Visit: Payer: Self-pay

## 2019-10-21 ENCOUNTER — Ambulatory Visit (INDEPENDENT_AMBULATORY_CARE_PROVIDER_SITE_OTHER): Payer: Medicare Other | Admitting: Psychiatry

## 2019-10-21 DIAGNOSIS — F411 Generalized anxiety disorder: Secondary | ICD-10-CM | POA: Diagnosis not present

## 2019-10-21 DIAGNOSIS — F3131 Bipolar disorder, current episode depressed, mild: Secondary | ICD-10-CM

## 2019-10-21 DIAGNOSIS — F1010 Alcohol abuse, uncomplicated: Secondary | ICD-10-CM | POA: Diagnosis not present

## 2019-10-21 MED ORDER — CARBAMAZEPINE ER 100 MG PO TB12: 100.0000 mg | ORAL_TABLET | Freq: Two times a day (BID) | ORAL | 0 refills | Status: DC

## 2019-10-21 MED ORDER — FLUOXETINE HCL 40 MG PO CAPS: 40.0000 mg | ORAL_CAPSULE | Freq: Every day | ORAL | 0 refills | Status: DC

## 2019-10-21 MED ORDER — QUETIAPINE FUMARATE 300 MG PO TABS: ORAL_TABLET | ORAL | 0 refills | Status: DC

## 2019-10-21 NOTE — Progress Notes (Signed)
Virtual Visit via Telephone Note  I connected with Kathryn Buckley on 10/21/19 at  3:40 PM EST by telephone and verified that I am speaking with the correct person using two identifiers.   I discussed the limitations, risks, security and privacy concerns of performing an evaluation and management service by telephone and the availability of in person appointments. I also discussed with the patient that there may be a patient responsible charge related to this service. The patient expressed understanding and agreed to proceed.   History of Present Illness: Patient was evaluated by phone session.  She is now settling in [place].  She moved to [place] to live close to her son.  She admitted sometimes frustration because she does not know a lot of people but overall she feels her mood is stable.  She admitted drinking alcohol almost every day 2-3 drinks.  She does not feel it is a problem however realized that she need to stop the drinking because she is taking psychotropic medication.  She is frustrated because not able to get the vaccine but she is hoping to get one soon.  Her sleep is good.  She has no tremors, shakes or any EPS.  She denies any mania psychosis or any hallucination.  She reported her Christmas was quiet.  She still unable to find a psychiatrist but recently seen her PCP first time and had blood work.  She was told her labs were normal.  We do not have results.  Patient like to continue her current medication.  She denies any binge drinking or any intoxication.  She denies any blackouts but like to cut down her drinking eventually.  She does not feel she need any medication to help her drinking at this time.   Past Psychiatric History:Reviewed. H/Obipolar disorder. Seeing in this office since 2008. Tried Lexapro, Cymbalta, Paxil,Zoloft, Geodon and Lamictal (rash). Lithium helped but stopped due to high creatinine.No h/osuicidal attempt.Tried Celexa worked very  well but it was stopped due to interaction with Nexium.    Psychiatric Specialty Exam: Physical Exam  Review of Systems  Psychiatric/Behavioral:       Substance use    Last menstrual period 09/23/1988.There is no height or weight on file to calculate BMI.  General Appearance: NA  Eye Contact:  NA  Speech:  Slow  Volume:  Normal  Mood:  Anxious  Affect:  NA  Thought Process:  Descriptions of Associations: Intact  Orientation:  Full (Time, Place, and Person)  Thought Content:  Logical  Suicidal Thoughts:  No  Homicidal Thoughts:  No  Memory:  Immediate;   Fair Recent;   Fair Remote;   Good  Judgement:  Fair  Insight:  Fair  Psychomotor Activity:  NA  Concentration:  Concentration: Fair and Attention Span: Fair  Recall:  [employer] of Knowledge:  Fair  Language:  Good  Akathisia:  No  Handed:  Right  AIMS (if indicated):     Assets:  Communication Skills Desire for Improvement Housing Social Support  ADL's:  Intact  Cognition:  WNL  Sleep:   ok      Assessment and Plan: Bipolar disorder type I.  Generalized anxiety disorder.  Alcohol use.  Discussed her alcohol use which has been persistent 2-3 drinks every day but denies any intoxication or any binge.  She does not feel she needed any medicine to help her drinking that she can do on her own.  She wants to continue current medication since  it is helping her mood and anxiety.  Continue Prozac 40 mg daily, Tegretol 100 mg twice a day and Seroquel 300 mg at bedtime.  Patient has blood work recently at her primary care physician and also looking for a local psychiatrist and until then she wants to continue to get refills from our office.  Discussed medication side effects and benefits.  Recommended to call us back if she has any question of any concern.  Follow-up in 3 months.  Will contact her PCP as patient had blood work one week ago in [facility]. Her PCP is Kizzie Ide at [facility]. [place], [place] Tel : [phone] and  Fax ; [phone].  Patient was evaluated through phone session.  She continues to have a lot of frustration and lately poor sleep Follow Up Instructions:    I discussed the assessment and treatment plan with the patient. The patient was provided an opportunity to ask questions and all were answered. The patient agreed with the plan and demonstrated an understanding of the instructions.   The patient was advised to call back or seek an in-person evaluation if the symptoms worsen or if the condition fails to improve as anticipated.  I provided 20 minutes of non-face-to-face time during this encounter.   Kathlee Nations, MD

## 2019-10-22 ENCOUNTER — Telehealth (HOSPITAL_COMMUNITY): Payer: Self-pay | Admitting: *Deleted

## 2019-10-22 NOTE — Telephone Encounter (Signed)
Writer left VM for pt regarding pt needs to fill out an ROI to [facility] to release latest lab work to this clinic. Writer checked with clinic and they do not have a ROI for [facility].

## 2020-01-19 ENCOUNTER — Ambulatory Visit (INDEPENDENT_AMBULATORY_CARE_PROVIDER_SITE_OTHER): Payer: Medicare Other | Admitting: Psychiatry

## 2020-01-19 ENCOUNTER — Other Ambulatory Visit: Payer: Self-pay

## 2020-01-19 DIAGNOSIS — F3131 Bipolar disorder, current episode depressed, mild: Secondary | ICD-10-CM

## 2020-01-19 DIAGNOSIS — F411 Generalized anxiety disorder: Secondary | ICD-10-CM

## 2020-01-19 MED ORDER — CARBAMAZEPINE ER 100 MG PO TB12: 100.0000 mg | ORAL_TABLET | Freq: Two times a day (BID) | ORAL | 0 refills | Status: DC

## 2020-01-19 MED ORDER — QUETIAPINE FUMARATE 300 MG PO TABS: ORAL_TABLET | ORAL | 0 refills | Status: DC

## 2020-01-19 MED ORDER — FLUOXETINE HCL 40 MG PO CAPS: 40.0000 mg | ORAL_CAPSULE | Freq: Every day | ORAL | 0 refills | Status: DC

## 2020-01-19 NOTE — Progress Notes (Signed)
Virtual Visit via Telephone Note  I connected with Kathryn Buckley on 01/19/20 at  2:00 PM EDT by telephone and verified that I am speaking with the correct person using two identifiers.   I discussed the limitations, risks, security and privacy concerns of performing an evaluation and management service by telephone and the availability of in person appointments. I also discussed with the patient that there may be a patient responsible charge related to this service. The patient expressed understanding and agreed to proceed.   History of Present Illness: Patient is evaluated by phone session.  She is adjusting very well in [place].  She reported her son is very good to him and her daughter-in-law takes good care of her.  She denies any mania, psychosis or any hallucination.  She has significantly cut down her drinking and drink more water and she feels refreshed.  She is sleeping good.  She denies any crying spells or any feeling of hopelessness.  She has unable to find a psychiatrist so far.  She like to continue medicine until Covid is over so she can call more places to get psychiatry appointment.  We have not received blood work and she promised that she will remind her PCP to fax blood work results.  She has no tremors shakes or any EPS.  She like to keep her current medication.  She denies any mania, psychosis or any hallucination.  Her energy level is good.  Her appetite is okay.  Past Psychiatric History:Reviewed. H/Obipolar disorder. Seeing in this office since 2008. Tried Lexapro, Cymbalta, Paxil,Zoloft, Geodon and Lamictal (rash). Lithium helped but stopped due to high creatinine.No h/osuicidal attempt.Tried Celexa worked very well but it was stopped due to interaction with Nexium.    Psychiatric Specialty Exam: Physical Exam  Review of Systems  Weight 160 lb (72.6 kg), last menstrual period 09/23/1988.There is no height or weight on file to calculate BMI.  General  Appearance: NA  Eye Contact:  NA  Speech:  Clear and Coherent  Volume:  Normal  Mood:  Euthymic  Affect:  NA  Thought Process:  Goal Directed  Orientation:  Full (Time, Place, and Person)  Thought Content:  WDL  Suicidal Thoughts:  No  Homicidal Thoughts:  No  Memory:  Immediate;   Good Recent;   Good Remote;   Good  Judgement:  Good  Insight:  Good  Psychomotor Activity:  NA  Concentration:  Concentration: Good and Attention Span: Good  Recall:  Good  Fund of Knowledge:  Good  Language:  Good  Akathisia:  No  Handed:  Right  AIMS (if indicated):     Assets:  Communication Skills Desire for Improvement Housing Resilience Social Support Transportation  ADL's:  Intact  Cognition:  WNL  Sleep:   good      Assessment and Plan: Bipolar disorder type I.  Generalized anxiety disorder.  Alcohol use.  Patient has cut down her drinking from the past.  Discussed medication side effects and benefits.  Reminded that we need blood work results including Tegretol level.  Patient promised that she will call to send all the blood work results to [place].  I will continue Prozac 40 mg daily, Tegretol 100 mg twice a day and Seroquel 300 mg at bedtime.  Patient is trying to find a psychiatrist in [facility] until then she will continue med management from our office.  I recommend to call us back if is any question of any concern.  Follow-up in  3 months.  Follow Up Instructions:    I discussed the assessment and treatment plan with the patient. The patient was provided an opportunity to ask questions and all were answered. The patient agreed with the plan and demonstrated an understanding of the instructions.   The patient was advised to call back or seek an in-person evaluation if the symptoms worsen or if the condition fails to improve as anticipated.  I provided 15 minutes of non-face-to-face time during this encounter.   Kathlee Nations, MD

## 2020-03-30 ENCOUNTER — Other Ambulatory Visit: Payer: Self-pay

## 2020-03-30 MED ORDER — ATORVASTATIN CALCIUM 40 MG PO TABS: ORAL_TABLET | ORAL | 0 refills | Status: DC

## 2020-03-31 ENCOUNTER — Other Ambulatory Visit: Payer: Self-pay

## 2020-03-31 MED ORDER — ATORVASTATIN CALCIUM 40 MG PO TABS: ORAL_TABLET | ORAL | 0 refills | Status: DC

## 2020-04-19 ENCOUNTER — Telehealth (INDEPENDENT_AMBULATORY_CARE_PROVIDER_SITE_OTHER): Payer: Medicare Other | Admitting: Psychiatry

## 2020-04-19 ENCOUNTER — Other Ambulatory Visit: Payer: Self-pay

## 2020-04-19 ENCOUNTER — Encounter (HOSPITAL_COMMUNITY): Payer: Self-pay | Admitting: Psychiatry

## 2020-04-19 VITALS — Wt 157.0 lb

## 2020-04-19 DIAGNOSIS — F3131 Bipolar disorder, current episode depressed, mild: Secondary | ICD-10-CM

## 2020-04-19 DIAGNOSIS — F1010 Alcohol abuse, uncomplicated: Secondary | ICD-10-CM | POA: Diagnosis not present

## 2020-04-19 DIAGNOSIS — F411 Generalized anxiety disorder: Secondary | ICD-10-CM

## 2020-04-19 MED ORDER — FLUOXETINE HCL 40 MG PO CAPS: 40.0000 mg | ORAL_CAPSULE | Freq: Every day | ORAL | 0 refills | Status: DC

## 2020-04-19 MED ORDER — CARBAMAZEPINE ER 100 MG PO TB12: 100.0000 mg | ORAL_TABLET | Freq: Two times a day (BID) | ORAL | 0 refills | Status: DC

## 2020-04-19 MED ORDER — QUETIAPINE FUMARATE 300 MG PO TABS: ORAL_TABLET | ORAL | 0 refills | Status: DC

## 2020-04-19 NOTE — Progress Notes (Signed)
Virtual Visit via Telephone Note  I connected with Kathryn Buckley on 04/19/20 at  3:40 PM EDT by telephone and verified that I am speaking with the correct person using two identifiers.  Location: Patient: home Provider: home office   I discussed the limitations, risks, security and privacy concerns of performing an evaluation and management service by telephone and the availability of in person appointments. I also discussed with the patient that there may be a patient responsible charge related to this service. The patient expressed understanding and agreed to proceed.   History of Present Illness: Patient is evaluated by phone session.  She is doing well and recently her PCP changed her medication and she is taking Mucinex and antiallergy which is helping her a lot and she does not feel as tired.  She is pleased that she is able to walk without any shortness of breath.  She like to stop drinking and since June she has not drink heavily other than 1 or 2 beer in a week.  She feels things are going well.  She is in [place] closer to her son and she feels her son and daughter-in-law helps a lot and take care of her.  However she does miss [place] because her brother and sister lives there.  We received blood work results from her PCP from [place].  She has elevation of creatinine and she is aware about it.  She is drinking enough water and her PCP will do repeat blood work in few months.  She is not sure if she will continue omeprazole long-term because of chronic renal insufficiency.  She is compliant with Tegretol, Prozac, Seroquel.  She has no tremors shakes or any EPS.  She like to keep her current medication.  She denies any panic attack or any mania psychosis or any hallucination.   Past Psychiatric History:Reviewed. H/Obipolar disorder. Seeingin this office since 2008. Tried Lexapro, Cymbalta, Paxil,Zoloft, Geodon and Lamictal (rash). Lithium helped but stopped due  to high creatinine.No h/osuicidal attempt.Tried Celexa worked very well but it was stopped due to interaction with Nexium.    Psychiatric Specialty Exam: Physical Exam  Review of Systems  Weight 157 lb (71.2 kg), last menstrual period 09/23/1988.There is no height or weight on file to calculate BMI.  General Appearance: NA  Eye Contact:  NA  Speech:  Clear and Coherent  Volume:  Normal  Mood:  Euthymic  Affect:  NA  Thought Process:  Goal Directed  Orientation:  Full (Time, Place, and Person)  Thought Content:  WDL  Suicidal Thoughts:  No  Homicidal Thoughts:  No  Memory:  Immediate;   Good Recent;   Fair Remote;   Fair  Judgement:  Intact  Insight:  Good  Psychomotor Activity:  NA  Concentration:  Concentration: Fair and Attention Span: Fair  Recall:  Good  Fund of Knowledge:  Good  Language:  Good  Akathisia:  No  Handed:  Right  AIMS (if indicated):     Assets:  Communication Skills Desire for Improvement Housing Resilience Social Support  ADL's:  Intact  Cognition:  WNL  Sleep:   ok      Assessment and Plan: Bipolar disorder type I.  Generalized anxiety disorder.  Alcohol abuse.  Encouraged to stop the drinking as patient has chronic renal insufficiency and acid reflux which may help after stopping completely alcohol.  Overall she is doing better and cut down her drinking.  She does not want to change medication  since she feel it is working well.  She has not able to find a psychiatrist in [facility] and like to continue until she get appointment from psychiatry in [place].  She is also not sure if she wants to live long-term because she is sometime next [employer].  We will continue med management until then.  Continue Prozac 40 mg daily, Tegretol 100 mg twice a day and Seroquel 300 mg at bedtime.  Recommended to call us back if she has any questions or any concern.  Follow-up in 3 months.  Follow Up Instructions:    I discussed the assessment and  treatment plan with the patient. The patient was provided an opportunity to ask questions and all were answered. The patient agreed with the plan and demonstrated an understanding of the instructions.   The patient was advised to call back or seek an in-person evaluation if the symptoms worsen or if the condition fails to improve as anticipated.  I provided 15 minutes of non-face-to-face time during this encounter.   Kathlee Nations, MD

## 2020-07-20 ENCOUNTER — Encounter (HOSPITAL_COMMUNITY): Payer: Self-pay | Admitting: Psychiatry

## 2020-07-20 ENCOUNTER — Telehealth (INDEPENDENT_AMBULATORY_CARE_PROVIDER_SITE_OTHER): Payer: Medicare Other | Admitting: Psychiatry

## 2020-07-20 ENCOUNTER — Other Ambulatory Visit: Payer: Self-pay

## 2020-07-20 VITALS — Wt 160.0 lb

## 2020-07-20 DIAGNOSIS — F411 Generalized anxiety disorder: Secondary | ICD-10-CM

## 2020-07-20 DIAGNOSIS — F3131 Bipolar disorder, current episode depressed, mild: Secondary | ICD-10-CM | POA: Diagnosis not present

## 2020-07-20 DIAGNOSIS — F1010 Alcohol abuse, uncomplicated: Secondary | ICD-10-CM

## 2020-07-20 MED ORDER — CARBAMAZEPINE ER 100 MG PO TB12: 100.0000 mg | ORAL_TABLET | Freq: Two times a day (BID) | ORAL | 0 refills | Status: DC

## 2020-07-20 MED ORDER — QUETIAPINE FUMARATE 300 MG PO TABS: ORAL_TABLET | ORAL | 0 refills | Status: DC

## 2020-07-20 MED ORDER — FLUOXETINE HCL 40 MG PO CAPS: 40.0000 mg | ORAL_CAPSULE | Freq: Every day | ORAL | 0 refills | Status: DC

## 2020-07-20 NOTE — Progress Notes (Signed)
Virtual Visit via Telephone Note  I connected with Kathryn Buckley on 07/20/20 at  3:40 PM EDT by telephone and verified that I am speaking with the correct person using two identifiers.  Location: Patient: Home Provider: Home office   I discussed the limitations, risks, security and privacy concerns of performing an evaluation and management service by telephone and the availability of in person appointments. I also discussed with the patient that there may be a patient responsible charge related to this service. The patient expressed understanding and agreed to proceed.   History of Present Illness: Patient is evaluated by phone session.  She is a still in [place] but sometimes she thinks about her decision and like to come back to [place] because she misses her sister and brother who lives here.  She noticed her allergies got worse living in [place].  She continues to drink alcohol but only 1 and sometimes 2 a day.  She denies any intoxication.  She reported her son and daughter-in-law are very supportive and helpful.  She like to stay in [place] until the holidays but want to make a decision in January if moved back to [place].  She is sleeping much better.  She has better energy level since her sleep is improved.  She does not want to change her medication.  She wants to keep the Tegretol, Prozac and Seroquel.  She has no tremors, shakes or any EPS.  Her appetite is good.  Her weight is stable.  She denies any mania, psychosis or any hallucination.  She denies any panic attack.  Past Psychiatric History:Reviewed. H/Obipolar disorder. Seeingin this office since 2008. Tried Lexapro, Cymbalta, Paxil,Zoloft, Geodon and Lamictal (rash). Lithium helped but stopped due to high creatinine.No h/osuicidal attempt.Tried Celexa worked very well but it was stopped due to interaction with Nexium.   Psychiatric Specialty Exam: Physical Exam  Review of Systems   Weight 160 lb (72.6 kg), last menstrual period 09/23/1988.There is no height or weight on file to calculate BMI.  General Appearance: NA  Eye Contact:  NA  Speech:  Normal Rate  Volume:  Normal  Mood:  Euthymic  Affect:  NA  Thought Process:  Goal Directed  Orientation:  Full (Time, Place, and Person)  Thought Content:  WDL  Suicidal Thoughts:  No  Homicidal Thoughts:  No  Memory:  Immediate;   Good Recent;   Fair Remote;   Fair  Judgement:  Intact  Insight:  Present  Psychomotor Activity:  NA  Concentration:  Concentration: Fair and Attention Span: Fair  Recall:  Good  Fund of Knowledge:  Good  Language:  Good  Akathisia:  No  Handed:  Right  AIMS (if indicated):     Assets:  Communication Skills Desire for Improvement Housing Social Support  ADL's:  Intact  Cognition:  WNL  Sleep:   better      Assessment and Plan: Bipolar disorder type I.  Generalized anxiety disorder.  Alcohol abuse.  Patient is a stable on her medication.  She has cut down her drinking and usually drink 1 -2 beer a day.  She denies any intoxication.  She wants to keep her current medication since it is helping her mood, mania and depression.  She is not sure if she wants to stay [place] long-term but like to make a decision earlier in January.  I will continue Prozac 40 mg daily, Tegretol 100 mg twice a day and Seroquel 300 mg at  bedtime.  Recommended to call us back if she is any question or any concern.  Follow-up in 3 months.     Follow Up Instructions:    I discussed the assessment and treatment plan with the patient. The patient was provided an opportunity to ask questions and all were answered. The patient agreed with the plan and demonstrated an understanding of the instructions.   The patient was advised to call back or seek an in-person evaluation if the symptoms worsen or if the condition fails to improve as anticipated.  I provided 13 minutes of non-face-to-face time during  this encounter.   Kathlee Nations, MD

## 2020-08-03 ENCOUNTER — Telehealth: Payer: Self-pay | Admitting: Family

## 2020-08-03 NOTE — Telephone Encounter (Signed)
Patient states she is moving back to [place] on Sunday or Monday. She is going to need an oxygen tank. She is wondering if you are able to help her get set up with a medical supply here in [place]. Patient states she is not able to bring her current oxygen tank.

## 2020-08-03 NOTE — Telephone Encounter (Signed)
Lvm for patient to call back about this message. She has not been seen in over a year.

## 2020-08-04 NOTE — Telephone Encounter (Signed)
Patient will call back on Monday to set up appointment.

## 2020-08-14 ENCOUNTER — Ambulatory Visit: Payer: Medicare Other | Admitting: Family

## 2020-10-05 ENCOUNTER — Other Ambulatory Visit: Payer: Self-pay

## 2020-10-06 ENCOUNTER — Encounter: Payer: Self-pay | Admitting: Family

## 2020-10-06 ENCOUNTER — Telehealth: Payer: Self-pay | Admitting: Family

## 2020-10-06 ENCOUNTER — Ambulatory Visit (INDEPENDENT_AMBULATORY_CARE_PROVIDER_SITE_OTHER): Payer: Medicare Other | Admitting: Family

## 2020-10-06 VITALS — BP 117/80 | HR 61 | Temp 98.5°F | Resp 18 | Wt 151.0 lb

## 2020-10-06 DIAGNOSIS — I10 Essential (primary) hypertension: Secondary | ICD-10-CM

## 2020-10-06 DIAGNOSIS — E785 Hyperlipidemia, unspecified: Secondary | ICD-10-CM | POA: Diagnosis not present

## 2020-10-06 DIAGNOSIS — Z1159 Encounter for screening for other viral diseases: Secondary | ICD-10-CM

## 2020-10-06 DIAGNOSIS — E119 Type 2 diabetes mellitus without complications: Secondary | ICD-10-CM

## 2020-10-06 DIAGNOSIS — E348 Other specified endocrine disorders: Secondary | ICD-10-CM | POA: Diagnosis not present

## 2020-10-06 DIAGNOSIS — Z950 Presence of cardiac pacemaker: Secondary | ICD-10-CM

## 2020-10-06 DIAGNOSIS — E0590 Thyrotoxicosis, unspecified without thyrotoxic crisis or storm: Secondary | ICD-10-CM | POA: Diagnosis not present

## 2020-10-06 DIAGNOSIS — K5900 Constipation, unspecified: Secondary | ICD-10-CM

## 2020-10-06 DIAGNOSIS — J418 Mixed simple and mucopurulent chronic bronchitis: Secondary | ICD-10-CM

## 2020-10-06 DIAGNOSIS — Z0000 Encounter for general adult medical examination without abnormal findings: Secondary | ICD-10-CM

## 2020-10-06 DIAGNOSIS — N289 Disorder of kidney and ureter, unspecified: Secondary | ICD-10-CM

## 2020-10-06 DIAGNOSIS — K219 Gastro-esophageal reflux disease without esophagitis: Secondary | ICD-10-CM

## 2020-10-06 LAB — TSH: TSH: 1.04 u[IU]/mL (ref 0.35–4.50)

## 2020-10-06 LAB — LIPID PANEL
Cholesterol: 166 mg/dL (ref 0–200)
HDL: 37.9 mg/dL — ABNORMAL LOW (ref >39.00)
LDL Cholesterol: 98 mg/dL (ref 0–99)
NonHDL: 128.14
Total CHOL/HDL Ratio: 4
Triglycerides: 151 mg/dL — ABNORMAL HIGH (ref 0.0–149.0)
VLDL: 30.2 mg/dL (ref 0.0–40.0)

## 2020-10-06 LAB — COMPREHENSIVE METABOLIC PANEL
ALT: 8 U/L (ref 0–35)
AST: 7 U/L (ref 0–37)
Albumin: 4.2 g/dL (ref 3.5–5.2)
Alkaline Phosphatase: 110 U/L (ref 39–117)
BUN: 23 mg/dL (ref 6–23)
CO2: 24 mEq/L (ref 19–32)
Calcium: 8.6 mg/dL (ref 8.4–10.5)
Chloride: 108 mEq/L (ref 96–112)
Creatinine, Ser: 1.73 mg/dL — ABNORMAL HIGH (ref 0.40–1.20)
GFR: 29.56 mL/min — ABNORMAL LOW (ref >60.00)
Glucose, Bld: 107 mg/dL — ABNORMAL HIGH (ref 70–99)
Potassium: 4.5 mEq/L (ref 3.5–5.1)
Sodium: 139 mEq/L (ref 135–145)
Total Bilirubin: 0.2 mg/dL (ref 0.2–1.2)
Total Protein: 6.1 g/dL (ref 6.0–8.3)

## 2020-10-06 LAB — HEMOGLOBIN A1C: Hgb A1c MFr Bld: 6.2 % (ref 4.6–6.5)

## 2020-10-06 LAB — MICROALBUMIN / CREATININE URINE RATIO
Creatinine,U: 16.6 mg/dL
Microalb Creat Ratio: 8 mg/g (ref 0.0–30.0)
Microalb, Ur: 1.3 mg/dL (ref 0.0–1.9)

## 2020-10-06 MED ORDER — OMEPRAZOLE 40 MG PO CPDR: 40.0000 mg | DELAYED_RELEASE_CAPSULE | Freq: Every day | ORAL | 1 refills | Status: DC

## 2020-10-06 MED ORDER — ALBUTEROL SULFATE HFA 108 (90 BASE) MCG/ACT IN AERS: 2.0000 | INHALATION_SPRAY | Freq: Four times a day (QID) | RESPIRATORY_TRACT | 5 refills | Status: AC | PRN

## 2020-10-06 MED ORDER — ATORVASTATIN CALCIUM 40 MG PO TABS: ORAL_TABLET | ORAL | 1 refills | Status: DC

## 2020-10-06 MED ORDER — AMLODIPINE BESYLATE 5 MG PO TABS
ORAL_TABLET | ORAL | 1 refills | Status: DC
Start: 2020-10-06 — End: 2021-04-26

## 2020-10-06 MED ORDER — TRELEGY ELLIPTA 100-62.5-25 MCG/INH IN AEPB: 1.0000 | INHALATION_SPRAY | Freq: Every day | RESPIRATORY_TRACT | 5 refills | Status: AC

## 2020-10-06 NOTE — Progress Notes (Signed)
Subjective:    Patient ID: Kathryn Buckley, female    DOB: [birth year], 70 y.o.   MRN: [id number]  HPI  Patient is a 70 yr old female who presents today for follow up. She moved to [facility] for 1 year but has not returned to the [place].  DM2- maintained on diet only.  Lab Results  Component Value Date   HGBA1C 5.7 05/04/2018   HGBA1C 5.6 12/10/2017   HGBA1C 5.1 04/30/2017   Lab Results  Component Value Date   LDLCALC 74 05/25/2019   CREATININE 1.56 (H) 05/25/2019   HTN- continues amlodipine 5mg .   BP Readings from Last 3 Encounters:  10/06/20 117/80  05/25/19 110/61  05/04/18 124/78   GERD- stable on omeprazole 40mg  once daily.  Hypothyroid- Maintained on synthroid 125 mcg once daily.  Lab Results  Component Value Date   TSH 0.17 (L) 05/25/2019   Hyperlipidemia- maintained on atorvastatin 40 mg.  Lab Results  Component Value Date   CHOL 156 05/25/2019   HDL 43.90 05/25/2019   LDLCALC 74 05/25/2019   LDLDIRECT 134.0 03/01/2016   TRIG 190.0 (H) 05/25/2019   CHOLHDL 4 05/25/2019   C/o constipation since she stopped drinking. She has added fiber without much improvement.   Review of Systems See HPI  Past Medical History:  Diagnosis Date  . COPD (chronic obstructive pulmonary disease) ([place])   . Depression   . HTN (hypertension)   . Hyperlipemia   . Seizures ([place])    Due to brain tumor that was removed in 2004  . Thyroid disease      Social History   Socioeconomic History  . Marital status: Married    Spouse name: Not on file  . Number of children: Not on file  . Years of education: Not on file  . Highest education level: Not on file  Occupational History  . Not on file  Tobacco Use  . Smoking status: Current Every Day Smoker    Packs/day: 2.50    Years: 40.00    Pack years: 100.00    Types: Cigarettes    Last attempt to quit: 03/14/2016    Years since quitting: 4.5  . Smokeless tobacco: Never Used  . Tobacco comment: States interest in  Chantix. Trying to quit.   Vaping Use  . Vaping Use: Never used  Substance and Sexual Activity  . Alcohol use: Yes    Alcohol/week: 2.0 - 3.0 standard drinks    Types: 2 - 3 Cans of beer per week    Comment: Occasional beer  . Drug use: No  . Sexual activity: Not Currently  Other Topics Concern  . Not on file  Social History Narrative   Widowed   and 2 chihuahua   She has 2 children- one son died of drug overdose   1 living son- [name]- lives in [place].     2 step sons   She has worked in the past in Research [employer], [employer])   She enjoys TV- likes to be home.         Social Determinants of Health   Financial Resource Strain: Not on file  Food Insecurity: Not on file  Transportation Needs: Not on file  Physical Activity: Not on file  Stress: Not on file  Social Connections: Not on file  Intimate Partner Violence: Not on file    Past Surgical History:  Procedure Laterality Date  . BRAIN SURGERY      Family History  Problem  Relation Age of Onset  . Bipolar disorder Father   . Heart disease Father   . Cancer Father        prostate  . Heart disease Mother   . Hypertension Brother   . Diabetes Brother   . Stroke Neg Hx   . Hyperlipidemia Neg Hx     Allergies  Allergen Reactions  . Lamictal [Lamotrigine] Rash  . Codeine     Other reaction(s): GI Upset (intolerance)    Current Outpatient Medications on File Prior to Visit  Medication Sig Dispense Refill  . Acetaminophen (TYLENOL 8 HOUR PO) Take by mouth.    Marland Kitchen amLODipine (NORVASC) 5 MG tablet TAKE 1 TABLET BY MOUTH DAILY 90 tablet 1  . atorvastatin (LIPITOR) 40 MG tablet TAKE 1 TABLET BY MOUTH EVERY DAY 90 tablet 0  . carbamazepine (TEGRETOL XR) 100 MG 12 hr tablet Take 1 tablet (100 mg total) by mouth 2 (two) times daily. 180 tablet 0  . FLUoxetine (PROZAC) 40 MG capsule Take 1 capsule (40 mg total) by mouth daily. 90 capsule 0  . levothyroxine (SYNTHROID) 125 MCG tablet Take 1 tablet (125 mcg  total) by mouth daily before breakfast. 30 tablet 1  . loratadine (CLARITIN) 10 MG tablet Take 10 mg by mouth daily.    Marland Kitchen omeprazole (PRILOSEC) 40 MG capsule Take 1 capsule (40 mg total) by mouth daily. 90 capsule 1  . QUEtiapine (SEROQUEL) 300 MG tablet TAKE 1 TABLET BY MOUTH AT BEDTIME 90 tablet 0  . UNABLE TO FIND Med Name: Mucus releif D 60 mg q12H    . Wheat Dextrin (BENEFIBER PO) Take by mouth.    . Zoster Vaccine Adjuvanted [facility]) injection Inject 0.5mg  IM now and again in 2-6 months. 0.5 mL 1  . glucose blood (ONE TOUCH ULTRA TEST) test strip Use as instructed to check blood sugar once a day. (Patient not taking: Reported on 10/06/2020) 100 each 1  . Lancets (ONETOUCH ULTRASOFT) lancets Use as instructed to check blood sugar once a day. (Patient not taking: Reported on 10/06/2020) 100 each 1  . TRELEGY ELLIPTA 100-62.5-25 MCG/INH AEPB Take 1 puff by mouth daily.     No current facility-administered medications on file prior to visit.    BP 117/80 (BP Location: Right Arm, Patient Position: Sitting, Cuff Size: Normal)   Pulse 61   Temp 98.5 F (36.9 C) (Oral)   Resp 18   Wt 151 lb (68.5 kg)   LMP 09/23/1988   SpO2 99%   BMI 21.67 kg/m       Objective:   Physical Exam Constitutional:      Appearance: She is well-developed and well-nourished.  Cardiovascular:     Rate and Rhythm: Normal rate and regular rhythm.     Heart sounds: Normal heart sounds. No murmur heard.   Pulmonary:     Effort: Pulmonary effort is normal. No respiratory distress.     Breath sounds: Normal breath sounds. No wheezing.  Psychiatric:        Mood and Affect: Mood and affect normal.        Behavior: Behavior normal.        Thought Content: Thought content normal.        Judgment: Judgment normal.           Assessment & Plan:  HTN- bp stable. Continue amlodipine 5mg  once daily.  Hyperthyroid- follow up tsh is WNL. Continue synthroid 144mcg once daily.  Hyperlipidemia-LDL at goal,  continue atorvastatin 40mg  once  daily.  Lab Results  Component Value Date   CHOL 166 10/06/2020   HDL 37.90 (L) 10/06/2020   LDLCALC 98 10/06/2020   LDLDIRECT 134.0 03/01/2016   TRIG 151.0 (H) 10/06/2020   CHOLHDL 4 10/06/2020   Depression- reports stable mood- management per psychiatry.  DM2-  Lab Results  Component Value Date   HGBA1C 6.2 10/06/2020   Follow up A1C is stable. Refer for DM eye exam.   COPD- reports significant improvement in her breathing since beginning Trelegy. Continue one puff daily. She does not have an albuterol inhaler- will begin prn.  Constipation- uncontrolled. Recommended addition of OTC miralax- see avs for details.   PPM- needs to re-establish with cardiology.    Renal insufficiency-  Lab Results  Component Value Date   CREATININE 1.73 (H) 10/06/2020   Cr has risen. Will refer to nephrology.  This visit occurred during the SARS-CoV-2 public health emergency.  Safety protocols were in place, including screening questions prior to the visit, additional usage of staff PPE, and extensive cleaning of exam room while observing appropriate contact time as indicated for disinfecting solutions.

## 2020-10-06 NOTE — Telephone Encounter (Signed)
Please initiate cologuard test.

## 2020-10-06 NOTE — Telephone Encounter (Signed)
cologuard order faxed with demographics and insurance information

## 2020-10-06 NOTE — Patient Instructions (Addendum)
Add miralax 1 capful once daily for constipation with goal of 1-2 soft bm's a day. You can increase/decrease the dose as needed.  Please schedule a follow up visit with your dermatologist. Please get your shingles vaccine from the pharmacy.  Please complete lab work prior to leaving.

## 2020-10-07 ENCOUNTER — Other Ambulatory Visit: Payer: Self-pay | Admitting: Family

## 2020-10-07 ENCOUNTER — Telehealth: Payer: Self-pay | Admitting: Family

## 2020-10-07 DIAGNOSIS — N289 Disorder of kidney and ureter, unspecified: Secondary | ICD-10-CM

## 2020-10-07 MED ORDER — LEVOTHYROXINE SODIUM 125 MCG PO TABS
125.0000 ug | ORAL_TABLET | Freq: Every day | ORAL | 1 refills | Status: DC
Start: 2020-10-07 — End: 2021-04-26

## 2020-10-07 NOTE — Telephone Encounter (Signed)
Please advise pt that her Kidney function has worsened slightly.  Is she taking NSAIDS? (ibuprofen, aleve etc?) If so, please discontinue.   I would also like to place a referral for her to see a kidney doctor. Referral has been pended.

## 2020-10-09 NOTE — Telephone Encounter (Signed)
Spoke w/ Pt- informed of results, she has only been taking Tylenol as needed. Denies ibuprofen, aleve or similar medications. She has agreed to see nephrologist.

## 2020-10-10 LAB — HEPATITIS C ANTIBODY
Hepatitis C Ab: NONREACTIVE
SIGNAL TO CUT-OFF: 0.01 (ref <1.00)

## 2020-10-16 NOTE — Telephone Encounter (Signed)
Patient has transportation problems and will like to see nephrology in [place]. Can you please enter the referral for nephrologist in [place]

## 2020-10-16 NOTE — Telephone Encounter (Signed)
Patient states she would like a call back in reference to her labs, patient she would like to go over lab results in reference to her kidneys.  Patient would also a referral to someone here in [place] for her kidneys.  Please advise

## 2020-10-17 ENCOUNTER — Telehealth (INDEPENDENT_AMBULATORY_CARE_PROVIDER_SITE_OTHER): Payer: Medicare Other | Admitting: Psychiatry

## 2020-10-17 ENCOUNTER — Encounter (HOSPITAL_COMMUNITY): Payer: Self-pay | Admitting: Psychiatry

## 2020-10-17 ENCOUNTER — Other Ambulatory Visit: Payer: Self-pay

## 2020-10-17 VITALS — Wt 151.0 lb

## 2020-10-17 DIAGNOSIS — F1010 Alcohol abuse, uncomplicated: Secondary | ICD-10-CM

## 2020-10-17 DIAGNOSIS — F411 Generalized anxiety disorder: Secondary | ICD-10-CM

## 2020-10-17 DIAGNOSIS — F3131 Bipolar disorder, current episode depressed, mild: Secondary | ICD-10-CM

## 2020-10-17 MED ORDER — QUETIAPINE FUMARATE 300 MG PO TABS: ORAL_TABLET | ORAL | 0 refills | Status: DC

## 2020-10-17 MED ORDER — CARBAMAZEPINE ER 100 MG PO TB12: 100.0000 mg | ORAL_TABLET | Freq: Two times a day (BID) | ORAL | 0 refills | Status: DC

## 2020-10-17 MED ORDER — FLUOXETINE HCL 40 MG PO CAPS
40.0000 mg | ORAL_CAPSULE | Freq: Every day | ORAL | 0 refills | Status: DC
Start: 2020-10-17 — End: 2021-01-15

## 2020-10-17 NOTE — Addendum Note (Signed)
Addended by: Debbrah Alar on: 10/17/2020 08:56 AM   Modules accepted: Orders

## 2020-10-17 NOTE — Progress Notes (Signed)
Virtual Visit via Telephone Note  I connected with Kathryn Buckley on 10/17/20 at  9:20 AM EST by telephone and verified that I am speaking with the correct person using two identifiers.  Location: Patient: Home Provider: Home Office   I discussed the limitations, risks, security and privacy concerns of performing an evaluation and management service by telephone and the availability of in person appointments. I also discussed with the patient that there may be a patient responsible charge related to this service. The patient expressed understanding and agreed to proceed.   History of Present Illness: Patient is evaluated by phone session.  She is on the phone by herself.  Patient moved back to [place] and she decided to stay in [place].  However she is struggling to find her own place.  Currently she is living with her sister and brother-in-law.  She feels proud that since November 10 she has not drink alcohol.  She feeling much better and she noticed her thinking is much clearer.  She has more energy.  Recently she had blood work and her creatinine is 1.73.  She is scheduled to see nephrologist in [facility].  Patient denies any mood swings, anger, mania, crying spells or any suicidal thoughts.  She is sleeping good with the medication.  Her energy level is improved.  Appetite is okay.  She wants to keep the current medication.  Past Psychiatric History:Reviewed. H/Obipolar disorder. Seeingin this office since 2008. Tried Lexapro, Cymbalta, Paxil,Zoloft, Geodon and Lamictal (rash). Lithium helped but stopped due to high creatinine.No h/osuicidal attempt.Tried Celexa worked very well but it was stopped due to interaction with Nexium.  Recent Results (from the past 2160 hour(s))  Urine Microalbumin w/creat. ratio     Status: None   Collection Time: 10/06/20 12:04 PM  Result Value Ref Range   Microalb, Ur 1.3 0.0 - 1.9 mg/dL   Creatinine,U 16.6 mg/dL   Microalb  Creat Ratio 8.0 0.0 - 30.0 mg/g  Hemoglobin A1c     Status: None   Collection Time: 10/06/20 12:04 PM  Result Value Ref Range   Hgb A1c MFr Bld 6.2 4.6 - 6.5 %    Comment: Glycemic Control Guidelines for People with Diabetes:Non Diabetic:  <6%Goal of Therapy: <7%Additional Action Suggested:  >8%   Comp Met (CMET)     Status: Abnormal   Collection Time: 10/06/20 12:04 PM  Result Value Ref Range   Sodium 139 135 - 145 mEq/L   Potassium 4.5 3.5 - 5.1 mEq/L   Chloride 108 96 - 112 mEq/L   CO2 24 19 - 32 mEq/L   Glucose, Bld 107 (H) 70 - 99 mg/dL   BUN 23 6 - 23 mg/dL   Creatinine, Ser 1.73 (H) 0.40 - 1.20 mg/dL   Total Bilirubin 0.2 0.2 - 1.2 mg/dL   Alkaline Phosphatase 110 39 - 117 U/L   AST 7 0 - 37 U/L   ALT 8 0 - 35 U/L   Total Protein 6.1 6.0 - 8.3 g/dL   Albumin 4.2 3.5 - 5.2 g/dL   GFR 29.56 (L) >60.00 mL/min    Comment: Calculated using the CKD-EPI Creatinine Equation (2021)   Calcium 8.6 8.4 - 10.5 mg/dL  Lipid panel     Status: Abnormal   Collection Time: 10/06/20 12:04 PM  Result Value Ref Range   Cholesterol 166 0 - 200 mg/dL    Comment: ATP III Classification       Desirable:  < 200 mg/dL  Borderline High:  200 - 239 mg/dL          High:  > = 240 mg/dL   Triglycerides 151.0 (H) 0.0 - 149.0 mg/dL    Comment: Normal:  <150 mg/dLBorderline High:  150 - 199 mg/dL   HDL 37.90 (L) >39.00 mg/dL   VLDL 30.2 0.0 - 40.0 mg/dL   LDL Cholesterol 98 0 - 99 mg/dL   Total CHOL/HDL Ratio 4     Comment:                Men          Women1/2 Average Risk     3.4          3.3Average Risk          5.0          4.42X Average Risk          9.6          7.13X Average Risk          15.0          11.0                       NonHDL 128.14     Comment: NOTE:  Non-HDL goal should be 30 mg/dL higher than patient's LDL goal (i.e. LDL goal of < 70 mg/dL, would have non-HDL goal of < 100 mg/dL)  Hepatitis C Antibody     Status: None   Collection Time: 10/06/20 12:04 PM  Result Value Ref  Range   Hepatitis C Ab NON-REACTIVE NON-REACTI   SIGNAL TO CUT-OFF 0.01 <1.00    Comment: . HCV antibody was non-reactive. There is no laboratory  evidence of HCV infection. . In most cases, no further action is required. However, if recent HCV exposure is suspected, a test for HCV RNA (test code [id number]) is suggested. . For additional information please refer to http://education.questdiagnostics.com/faq/FAQ22v1 (This link is being provided for informational/ educational purposes only.) .   TSH     Status: None   Collection Time: 10/06/20 12:04 PM  Result Value Ref Range   TSH 1.04 0.35 - 4.50 uIU/mL     Psychiatric Specialty Exam: Physical Exam  Review of Systems  Weight 151 lb (68.5 kg), last menstrual period 09/23/1988.There is no height or weight on file to calculate BMI.  General Appearance: NA  Eye Contact:  NA  Speech:  Normal Rate  Volume:  Normal  Mood:  Euthymic  Affect:  NA  Thought Process:  Goal Directed  Orientation:  Full (Time, Place, and Person)  Thought Content:  WDL  Suicidal Thoughts:  No  Homicidal Thoughts:  No  Memory:  Immediate;   Good Recent;   Good Remote;   Fair  Judgement:  Intact  Insight:  Present  Psychomotor Activity:  NA  Concentration:  Concentration: Fair and Attention Span: Fair  Recall:  [employer] of Knowledge:  Good  Language:  Good  Akathisia:  No  Handed:  Right  AIMS (if indicated):     Assets:  Communication Skills Desire for Improvement Housing Resilience Social Support  ADL's:  Intact  Cognition:  WNL  Sleep:   ok      Assessment and Plan: Bipolar disorder type I.  Generalized anxiety disorder.  Alcohol abuse and partial remission.  Patient is doing much better since she stopped drinking 2 months ago.  Her thinking is much clearer and she feels her mood is  much better.  I reviewed her blood work results.  Her creatinine is 1.73 and hemoglobin A1c 6.2.  She is in the process of seeing a nephrologist.  Overall  she feels things are going well and like to keep the current medication.  Continue Prozac 40 mg daily, Tegretol 100 mg twice a day and Seroquel 300 mg bedtime.  Patient told her PCP forgot to do Tegretol level but she will remind the PCP to get the blood work.  Recommended to call us back if is any question or any concern.  Follow-up in 3 months.  Follow Up Instructions:    I discussed the assessment and treatment plan with the patient. The patient was provided an opportunity to ask questions and all were answered. The patient agreed with the plan and demonstrated an understanding of the instructions.   The patient was advised to call back or seek an in-person evaluation if the symptoms worsen or if the condition fails to improve as anticipated.  I provided 18 minutes of non-face-to-face time during this encounter.   Kathlee Nations, MD

## 2020-11-09 DIAGNOSIS — Z1211 Encounter for screening for malignant neoplasm of colon: Secondary | ICD-10-CM | POA: Diagnosis not present

## 2020-11-09 DIAGNOSIS — Z1212 Encounter for screening for malignant neoplasm of rectum: Secondary | ICD-10-CM | POA: Diagnosis not present

## 2020-11-10 LAB — COLOGUARD: Cologuard: POSITIVE — AB

## 2020-11-22 ENCOUNTER — Telehealth: Payer: Self-pay | Admitting: Family

## 2020-11-22 DIAGNOSIS — R195 Other fecal abnormalities: Secondary | ICD-10-CM | POA: Insufficient documentation

## 2020-11-22 NOTE — Telephone Encounter (Signed)
Please advised pt that I received the results of her cologuard test and she tested + (abnormal).  It is very important that she get a colonoscopy to follow up on this.  I have placed the order.

## 2020-11-22 NOTE — Telephone Encounter (Signed)
Called but no answer, lvm for patient to call us back about he cologuard results

## 2020-11-23 ENCOUNTER — Encounter: Payer: Self-pay | Admitting: Family

## 2020-11-27 NOTE — Telephone Encounter (Signed)
Left another message, patient advised to call in regards to important results.

## 2020-12-04 NOTE — Telephone Encounter (Signed)
Patient advised of results and the importance to schedule a colonoscopy. She verbalized understanding but said 'she doesn't know if she can through with the procedure" I told her a nurse at GI will explain the procedure to her and that is very important that she gets schedule.

## 2021-01-02 ENCOUNTER — Encounter (HOSPITAL_COMMUNITY): Payer: Self-pay

## 2021-01-09 ENCOUNTER — Encounter: Payer: Medicare Other | Admitting: Family

## 2021-01-15 ENCOUNTER — Other Ambulatory Visit: Payer: Self-pay

## 2021-01-15 ENCOUNTER — Telehealth (INDEPENDENT_AMBULATORY_CARE_PROVIDER_SITE_OTHER): Payer: Medicare Other | Admitting: Psychiatry

## 2021-01-15 ENCOUNTER — Encounter (HOSPITAL_COMMUNITY): Payer: Self-pay | Admitting: Psychiatry

## 2021-01-15 VITALS — Wt 155.0 lb

## 2021-01-15 DIAGNOSIS — F3131 Bipolar disorder, current episode depressed, mild: Secondary | ICD-10-CM

## 2021-01-15 DIAGNOSIS — F411 Generalized anxiety disorder: Secondary | ICD-10-CM

## 2021-01-15 MED ORDER — QUETIAPINE FUMARATE 300 MG PO TABS: ORAL_TABLET | ORAL | 0 refills | Status: DC

## 2021-01-15 MED ORDER — CARBAMAZEPINE ER 100 MG PO TB12: 100.0000 mg | ORAL_TABLET | Freq: Two times a day (BID) | ORAL | 0 refills | Status: DC

## 2021-01-15 MED ORDER — TRAZODONE HCL 100 MG PO TABS: 50.0000 mg | ORAL_TABLET | Freq: Every day | ORAL | 0 refills | Status: DC

## 2021-01-15 MED ORDER — FLUOXETINE HCL 40 MG PO CAPS: 40.0000 mg | ORAL_CAPSULE | Freq: Every day | ORAL | 0 refills | Status: DC

## 2021-01-15 NOTE — Progress Notes (Signed)
Virtual Visit via Telephone Note  I connected with Kathryn Buckley on 01/15/21 at  3:40 PM EDT by telephone and verified that I am speaking with the correct person using two identifiers.  Location: Patient: home Provider: home office   I discussed the limitations, risks, security and privacy concerns of performing an evaluation and management service by telephone and the availability of in person appointments. I also discussed with the patient that there may be a patient responsible charge related to this service. The patient expressed understanding and agreed to proceed.   History of Present Illness: Patient is evaluated by phone session.  She is frustrated because not able to move in to her own place.  She reported irritability, mood swings and lack of sleep.  She thinks all the time about her new place and not happy that it has been delayed.  She is living with her sister and brother-in-law for more than 6 months and now she feels very uncomfortable living there.  She is supposed to live only 1 month.  She admitted some time notice arguments between sister and brother-in-law and she does not come out from the room.  She told the new place is having renovation and she strongly believes it should be completed within a month.  She also he schedule her appointment with PCP because she thought she will move to her new place.  Now she has appointment on May 31 to see her PCP for blood work.  She also scheduled to see a nephrologist which she had rescheduled for same reason.  She endorsed having racing thoughts and sometimes she has to walk out of her room in the night because she could not sleep.  She admitted drinking coffee does not help either.  However overall she has cut down her caffeine intake to 3 cups a day.  She is to smoke on and off.  She denies any mania, psychosis, hallucination but admitted irritability, frustration and sometimes mood swings.  She denies any suicidal thoughts or homicidal  thoughts.  Her appetite is okay and she gained weight recently because she does not leave her room.  She was recommended colonoscopy after positive results on cologard but she is reluctant to do the colonoscopy.  She understand that she has to do that and also see the nephrologist but hoping her new place ready in few weeks.  She is taking Seroquel, Prozac, and Tegretol.  She has not had contact PCP for Tegretol level which she is supposed to do that and it was recommended on the last visit.  She promised to have a blood work done on May 31 and she will contact her PCP to add the Tegretol level.  Her PCP is Dr. Lemar Buckley.  Patient denies drinking and she feels proud that her last drink was on May 12.  She like to stay in [facility] and hoping she can move to her new place soon.  Past Psychiatric History:Reviewed. H/Obipolar disorder. Seeingin this office since 2008. Tried Lexapro, Cymbalta, Paxil,Zoloft, Geodon and Lamictal (rash). Lithium helped but stopped due to high creatinine.No h/osuicidal attempt.Tried Celexa worked very well but it was stopped due to interaction with Nexium.   Psychiatric Specialty Exam: Physical Exam  Review of Systems  Weight 155 lb (70.3 kg), last menstrual period 09/23/1988.There is no height or weight on file to calculate BMI.  General Appearance: NA  Eye Contact:  NA  Speech:  Slow  Volume:  Decreased  Mood:  Depressed and Irritable  Affect:  NA  Thought Process:  Descriptions of Associations: Intact  Orientation:  Full (Time, Place, and Person)  Thought Content:  Rumination  Suicidal Thoughts:  No  Homicidal Thoughts:  No  Memory:  Immediate;   Good Recent;   Good Remote;   Fair  Judgement:  Intact  Insight:  Present  Psychomotor Activity:  NA  Concentration:  Concentration: Fair and Attention Span: Fair  Recall:  Good  Fund of Knowledge:  Good  Language:  Good  Akathisia:  No  Handed:  Right  AIMS (if indicated):     Assets:   Communication Skills Desire for Improvement Housing Resilience  ADL's:  Intact  Cognition:  WNL  Sleep:   fair      Assessment and Plan: Bipolar disorder type I.  Generalized anxiety disorder.  Discussed lack of sleep which is precipitated because she does not have her own place and living with her sister and brother-in-law.  She is hoping to have her own place in few weeks.  I strongly encouraged she should keep the appointment with PCP and nephrologist and had blood work.  She feels the combination of the medicine working her mania, anxiety but does not help the sleep.  Recommend to try trazodone 50-100 mg for insomnia until she moved to her own place.  Continue Seroquel 300 mg at bedtime, Prozac 40 mg daily and Tegretol 100 mg twice a day.  I will forward my note to her PCP Dr. Conley Buckley to have her Tegretol level at her next visit to PCP.  Recommended to call us back if she has any question or any concern.  Follow-up in 3 months.  Follow Up Instructions:    I discussed the assessment and treatment plan with the patient. The patient was provided an opportunity to ask questions and all were answered. The patient agreed with the plan and demonstrated an understanding of the instructions.   The patient was advised to call back or seek an in-person evaluation if the symptoms worsen or if the condition fails to improve as anticipated.  I provided 19 minutes of non-face-to-face time during this encounter.   Kathryn Nations, MD

## 2021-02-14 ENCOUNTER — Other Ambulatory Visit (HOSPITAL_COMMUNITY): Payer: Self-pay | Admitting: Psychiatry

## 2021-02-14 DIAGNOSIS — F411 Generalized anxiety disorder: Secondary | ICD-10-CM

## 2021-02-16 ENCOUNTER — Telehealth: Payer: Self-pay | Admitting: Family

## 2021-02-16 NOTE — Telephone Encounter (Signed)
   EVALENA FUJII DOB: Jan 12, 1951 MRN: [id number]   RIDER WAIVER AND RELEASE OF LIABILITY  For purposes of improving physical access to our facilities, [facility] is pleased to partner with third parties to provide [facility] patients or other authorized individuals the option of convenient, on-demand ground transportation services (the Technical bre[employer]") through use of the technology service that enables users to request on-demand ground transportation from independent third-party providers.  By opting to use and accept these [employer], I, the undersigned, hereby agree on behalf of myself, and on behalf of any minor child using the [employer] for whom I am the parent or legal guardian, as follows:  1. Government social research officer provided to me are provided by independent third-party transportation providers who are not [employer] or employees and who are unaffiliated with [employer]. 2. [facility] is neither a transportation carrier nor a common or public carrier. 3. [facility] has no control over the quality or safety of the transportation that occurs as a result of the [employer]. 4. [facility] cannot guarantee that any third-party transportation provider will complete any arranged transportation service. 5. [facility] makes no representation, warranty, or guarantee regarding the reliability, timeliness, quality, safety, suitability, or availability of any of the Transport Services or that they will be error free. 6. I fully understand that traveling by vehicle involves risks and dangers of serious bodily injury, including permanent disability, paralysis, and death. I agree, on behalf of myself and on behalf of any minor child using the Transport Services for whom I am the parent or legal guardian, that the entire risk arising out of my use of the [employer] remains solely with me, to the maximum extent permitted under applicable law. 7. The [employer] are provided "as is" and "as available." [facility] disclaims all representations and warranties, express, implied or statutory, not expressly set out in these terms, including the implied warranties of merchantability and fitness for a particular purpose. 8. I hereby waive and release [facility], its agents, employees, officers, directors, representatives, insurers, attorneys, assigns, successors, subsidiaries, and affiliates from any and all past, present, or future claims, demands, liabilities, actions, causes of action, or suits of any kind directly or indirectly arising from acceptance and use of the [employer]. 9. I further waive and release [facility] and its affiliates from all present and future liability and responsibility for any injury or death to persons or damages to property caused by or related to the use of the [employer]. 10. I have read this Waiver and Release of Liability, and I understand the terms used in it and their legal significance. This Waiver is freely and voluntarily given with the understanding that my right (as well as the right of any minor child for whom I am the parent or legal guardian using the [employer]) to legal recourse against [facility] in connection with the [employer] is knowingly surrendered in return for use of these services.   I attest that I read the consent document to [name], gave Ms. Blane the opportunity to ask questions and answered the questions asked (if any). I affirm that [name] then provided consent for she's participation in this program.     Darrick Meigs Vilsaint

## 2021-02-20 ENCOUNTER — Telehealth: Payer: Self-pay | Admitting: Family

## 2021-02-20 ENCOUNTER — Other Ambulatory Visit: Payer: Self-pay

## 2021-02-20 ENCOUNTER — Ambulatory Visit (INDEPENDENT_AMBULATORY_CARE_PROVIDER_SITE_OTHER): Payer: Medicare Other | Admitting: Family

## 2021-02-20 ENCOUNTER — Encounter: Payer: Self-pay | Admitting: Family

## 2021-02-20 VITALS — BP 130/65 | HR 60 | Temp 98.6°F | Resp 16 | Ht 70.0 in | Wt 154.0 lb

## 2021-02-20 DIAGNOSIS — Z5181 Encounter for therapeutic drug level monitoring: Secondary | ICD-10-CM

## 2021-02-20 DIAGNOSIS — N289 Disorder of kidney and ureter, unspecified: Secondary | ICD-10-CM

## 2021-02-20 DIAGNOSIS — E039 Hypothyroidism, unspecified: Secondary | ICD-10-CM | POA: Diagnosis not present

## 2021-02-20 DIAGNOSIS — R195 Other fecal abnormalities: Secondary | ICD-10-CM

## 2021-02-20 DIAGNOSIS — Z0000 Encounter for general adult medical examination without abnormal findings: Secondary | ICD-10-CM | POA: Diagnosis not present

## 2021-02-20 DIAGNOSIS — F3162 Bipolar disorder, current episode mixed, moderate: Secondary | ICD-10-CM

## 2021-02-20 DIAGNOSIS — R739 Hyperglycemia, unspecified: Secondary | ICD-10-CM

## 2021-02-20 DIAGNOSIS — E348 Other specified endocrine disorders: Secondary | ICD-10-CM

## 2021-02-20 MED ORDER — SHINGRIX 50 MCG/0.5ML IM SUSR: INTRAMUSCULAR | 1 refills | Status: AC

## 2021-02-20 NOTE — Telephone Encounter (Signed)
Please call [facility] ([facility]) and check which immunizations they gave her.

## 2021-02-20 NOTE — Progress Notes (Signed)
Subjective:    Patient ID: Kathryn Buckley, female    DOB: [birth year], 70 y.o.   MRN: [id number]  HPI  Patient presents today for complete physical.  Immunizations: due for pneumovax 23 per our records- pt states she got it at the pharmacy- will request records. shingrix  Diet: eats a lot of sweets Exercise: limited due to pulmonary issues Wt Readings from Last 3 Encounters:  02/20/21 154 lb (69.9 kg)  10/06/20 151 lb (68.5 kg)  05/25/19 159 lb (72.1 kg)  Colonoscopy: did cologuard. 3 months at an outside system and it was +.   She uses overnight oxygen and prn.  due Dexa: due Pap Smear: N/A Mammogram: N/A Vision: due Dental:  Dentures     Review of Systems  Constitutional: Negative for unexpected weight change.  HENT: Negative for rhinorrhea.   Eyes: Positive for visual disturbance (chronic loss of peripheral vision due brain tumor hx).  Respiratory: Positive for shortness of breath. Negative for cough.   Cardiovascular: Negative for leg swelling.  Gastrointestinal: Positive for constipation (tried miralax, "hated it."  ). Negative for diarrhea.  Genitourinary: Positive for frequency (due to drinking a lot of water). Negative for dysuria.  Musculoskeletal: Negative for arthralgias.  Skin: Negative for rash.  Neurological: Negative for headaches.  Hematological: Negative for adenopathy.  Psychiatric/Behavioral:       Stable, see HPI   Past Medical History:  Diagnosis Date  . COPD (chronic obstructive pulmonary disease) ([facility])   . Depression   . HTN (hypertension)   . Hyperlipemia   . Seizures ([place])    Due to brain tumor that was removed in 2004  . Thyroid disease      Social History   Socioeconomic History  . Marital status: Married    Spouse name: Not on file  . Number of children: Not on file  . Years of education: Not on file  . Highest education level: Not on file  Occupational History  . Not on file  Tobacco Use  . Smoking status: Current  Every Day Smoker    Packs/day: 2.50    Years: 40.00    Pack years: 100.00    Types: Cigarettes    Last attempt to quit: 03/14/2016    Years since quitting: 4.9  . Smokeless tobacco: Never Used  Vaping Use  . Vaping Use: Never used  Substance and Sexual Activity  . Alcohol use: Yes    Alcohol/week: 2.0 - 3.0 standard drinks    Types: 2 - 3 Cans of beer per week    Comment: Occasional beer  . Drug use: No  . Sexual activity: Not Currently  Other Topics Concern  . Not on file  Social History Narrative   Widowed   and 2 chihuahua   She has 2 children- one son died of drug overdose   1 living son- Kathryn Buckley- lives in [place].     2 step sons   She has worked in the past in Research officer, trade union)   She enjoys TV- likes to be home.         Social Determinants of Health   Financial Resource Strain: Not on file  Food Insecurity: Not on file  Transportation Needs: Not on file  Physical Activity: Not on file  Stress: Not on file  Social Connections: Not on file  Intimate Partner Violence: Not on file    Past Surgical History:  Procedure Laterality Date  . BRAIN SURGERY     age  18, she reports a brain tumor removal.      Family History  Problem Relation Age of Onset  . Bipolar disorder Father   . Heart disease Father   . Cancer Father        prostate  . Heart disease Mother   . Hypertension Brother   . Diabetes Brother   . Diabetes Mellitus II Sister   . Stroke Neg Hx   . Hyperlipidemia Neg Hx     Allergies  Allergen Reactions  . Lamictal [Lamotrigine] Rash  . Codeine     Other reaction(s): GI Upset (intolerance)    Current Outpatient Medications on File Prior to Visit  Medication Sig Dispense Refill  . Acetaminophen (TYLENOL 8 HOUR PO) Take by mouth.    Marland Kitchen albuterol (VENTOLIN HFA) 108 (90 Base) MCG/ACT inhaler Inhale 2 puffs into the lungs every 6 (six) hours as needed for wheezing or shortness of breath. 8 g 5  . amLODipine (NORVASC) 5 MG tablet  TAKE 1 TABLET BY MOUTH DAILY 90 tablet 1  . atorvastatin (LIPITOR) 40 MG tablet TAKE 1 TABLET BY MOUTH EVERY DAY 90 tablet 1  . carbamazepine (TEGRETOL XR) 100 MG 12 hr tablet Take 1 tablet (100 mg total) by mouth 2 (two) times daily. 180 tablet 0  . FLUoxetine (PROZAC) 40 MG capsule Take 1 capsule (40 mg total) by mouth daily. 90 capsule 0  . levothyroxine (SYNTHROID) 125 MCG tablet Take 1 tablet (125 mcg total) by mouth daily before breakfast. 90 tablet 1  . loratadine (CLARITIN) 10 MG tablet Take 10 mg by mouth daily.    Marland Kitchen omeprazole (PRILOSEC) 40 MG capsule Take 1 capsule (40 mg total) by mouth daily. 90 capsule 1  . QUEtiapine (SEROQUEL) 300 MG tablet TAKE 1 TABLET BY MOUTH AT BEDTIME 90 tablet 0  . traZODone (DESYREL) 100 MG tablet Take 0.5-1 tablets (50-100 mg total) by mouth at bedtime. 30 tablet 0  . TRELEGY ELLIPTA 100-62.5-25 MCG/INH AEPB Take 1 puff by mouth daily. 28 each 5  . UNABLE TO FIND Med Name: Mucus releif D 60 mg q12H    . Wheat Dextrin (BENEFIBER PO) Take by mouth.     No current facility-administered medications on file prior to visit.    BP 130/65 (BP Location: Right Arm, Patient Position: Sitting, Cuff Size: Small)   Pulse 60   Temp 98.6 F (37 C) (Oral)   Resp 16   Ht 5\' 10"  (1.778 m)   Wt 154 lb (69.9 kg)   LMP 09/23/1988   SpO2 91%   BMI 22.10 kg/m       Objective:   Physical Exam  Physical Exam  Constitutional: She is oriented to person, place, and time. She appears well-developed and well-nourished. No distress.  HENT:  Head: Normocephalic and atraumatic.  Right Ear: Tympanic membrane and ear canal normal.  Left Ear: Tympanic membrane and ear canal normal.  Mouth/Throat: Not examined.  Eyes: Pupils are equal, round, and reactive to light. No scleral icterus.  Neck: Normal range of motion. No thyromegaly present.  Cardiovascular: Normal rate and regular rhythm.   No murmur heard. Pulmonary/Chest: Effort normal and breath sounds normal. No  respiratory distress. He has no wheezes. She has no rales. She exhibits no tenderness.  Abdominal: Soft. Bowel sounds are normal. She exhibits no distension and no mass. There is no tenderness. There is no rebound and no guarding.  Musculoskeletal: She exhibits no edema.  Lymphadenopathy:    She has no cervical  adenopathy.  Neurological: She is alert and oriented to person, place, and time. She has normal patellar reflexes. She exhibits normal muscle tone. Coordination normal.  Skin: Skin is warm and dry.  Psychiatric: She has a normal mood and affect. Her behavior is normal. Judgment and thought content normal.     Breast/pelvic: deferred.   Assessment & Plan:        Assessment & Plan:

## 2021-02-20 NOTE — Patient Instructions (Signed)
Please get your covid booster on the first floor.

## 2021-02-20 NOTE — Assessment & Plan Note (Signed)
Stable, being followed by Psychiatry-Dr. Adele Schilder.

## 2021-02-21 NOTE — Assessment & Plan Note (Addendum)
Discussed healthy diet, exercise.  Refer for colonoscopy (had abnormal cologuard in an outside system).  Refer for mammogram, dexa.  Recommended covid shot #4. Discussed importance of smoking cessation.

## 2021-02-21 NOTE — Telephone Encounter (Signed)
Per pharmacist she received a pfizer shot on 08-02-20, this is already on her records.

## 2021-02-23 ENCOUNTER — Telehealth: Payer: Self-pay | Admitting: Family

## 2021-02-23 DIAGNOSIS — J439 Emphysema, unspecified: Secondary | ICD-10-CM

## 2021-02-23 NOTE — Telephone Encounter (Signed)
Pt, called to request a oxygen company she does not want to deal with [employer] any more  Please advice

## 2021-02-25 ENCOUNTER — Telehealth: Payer: Self-pay | Admitting: Family

## 2021-02-25 NOTE — Telephone Encounter (Signed)
Looks like pt forgot to go to the lab. Please schedule a lab visit.

## 2021-02-26 NOTE — Addendum Note (Signed)
Addended by: Jiles Prows on: 02/26/2021 04:20 PM   Modules accepted: Orders

## 2021-02-26 NOTE — Telephone Encounter (Signed)
Order has been placed.  Can you please give print out to [facility] and notify pt?

## 2021-02-26 NOTE — Telephone Encounter (Signed)
The day of the visit patient had to go to the pharmacy to get a covid shot prior to having labs due to time. She reports when she came back the lab had a line and she was not able to wait.  The orders have been changed to future, patient said she will have to call back to schedule due to transportation problems.

## 2021-02-27 NOTE — Telephone Encounter (Signed)
Orders to be completed for [employer]. Will fax with patient's information when ready.

## 2021-03-02 ENCOUNTER — Telehealth: Payer: Self-pay | Admitting: Family

## 2021-03-02 NOTE — Telephone Encounter (Signed)
Received oxygen recertification for her new company.  How many liters of oxygen is she using and is she using it as needed or all the time?  Also, they are requesting room air oxygen reading at rest, with walking, and walking with oxygen. We can schedule a nurse visit for these tests please.

## 2021-03-02 NOTE — Telephone Encounter (Signed)
Patient advised she will need to come in for sone oxygen level check as part of completing the forms for [employer] to supply her oxygen. Patient showed very hesitant to come in on the phone. I offered her an appointment but she hang up w/o scheduling

## 2021-03-02 NOTE — Telephone Encounter (Signed)
Lvm for patient to call back

## 2021-03-02 NOTE — Telephone Encounter (Signed)
Noted that pt would not schedule oxygen evaluation as requested.  This information is required in order for [employer] to process her oxygen request. She was made aware of this by CMA.

## 2021-03-08 ENCOUNTER — Telehealth (HOSPITAL_COMMUNITY): Payer: Self-pay | Admitting: *Deleted

## 2021-03-08 DIAGNOSIS — F411 Generalized anxiety disorder: Secondary | ICD-10-CM

## 2021-03-08 MED ORDER — TRAZODONE HCL 100 MG PO TABS: 50.0000 mg | ORAL_TABLET | Freq: Every day | ORAL | 0 refills | Status: DC

## 2021-03-08 NOTE — Telephone Encounter (Signed)
Send to [facility] in [place].

## 2021-03-08 NOTE — Telephone Encounter (Signed)
Patient called and stated she is having trouble sleeping. She said she has moved into her new place and states that she is happy but needs help with sleep. She would like to continue her Trazadone and needs a refill.

## 2021-03-29 ENCOUNTER — Other Ambulatory Visit: Payer: Self-pay | Admitting: Family

## 2021-03-29 ENCOUNTER — Other Ambulatory Visit (HOSPITAL_COMMUNITY): Payer: Self-pay | Admitting: Psychiatry

## 2021-03-29 DIAGNOSIS — F411 Generalized anxiety disorder: Secondary | ICD-10-CM

## 2021-04-17 ENCOUNTER — Encounter (HOSPITAL_COMMUNITY): Payer: Self-pay | Admitting: Psychiatry

## 2021-04-17 ENCOUNTER — Telehealth (INDEPENDENT_AMBULATORY_CARE_PROVIDER_SITE_OTHER): Payer: Medicare Other | Admitting: Psychiatry

## 2021-04-17 ENCOUNTER — Other Ambulatory Visit: Payer: Self-pay

## 2021-04-17 DIAGNOSIS — F3131 Bipolar disorder, current episode depressed, mild: Secondary | ICD-10-CM

## 2021-04-17 DIAGNOSIS — F411 Generalized anxiety disorder: Secondary | ICD-10-CM

## 2021-04-17 MED ORDER — QUETIAPINE FUMARATE 300 MG PO TABS
ORAL_TABLET | ORAL | 0 refills | Status: DC
Start: 2021-04-17 — End: 2021-07-18

## 2021-04-17 MED ORDER — FLUOXETINE HCL 40 MG PO CAPS: 40.0000 mg | ORAL_CAPSULE | Freq: Every day | ORAL | 0 refills | Status: DC

## 2021-04-17 MED ORDER — CARBAMAZEPINE ER 100 MG PO TB12: 100.0000 mg | ORAL_TABLET | Freq: Two times a day (BID) | ORAL | 0 refills | Status: DC

## 2021-04-17 NOTE — Progress Notes (Signed)
Virtual Visit via Telephone Note  I connected with Kathryn Buckley on 04/17/21 at  2:20 PM EDT by telephone and verified that I am speaking with the correct person using two identifiers.  Location: Patient: Home Provider: Home Office   I discussed the limitations, risks, security and privacy concerns of performing an evaluation and management service by telephone and the availability of in person appointments. I also discussed with the patient that there may be a patient responsible charge related to this service. The patient expressed understanding and agreed to proceed.   History of Present Illness: Patient is evaluated by phone session.  She is now moved into her new place and she is very happy.  She is sleeping better and does not need trazodone.  She recently had a visit with her PCP and she had a blood work schedule but she forgot to do.  But promised to have a done soon.  Overall she feels things are going well.  She denies any mania, psychosis, hallucination.  She is proud that she is not drinking and she has maybe 1 drink in June.  She had a good support from her siblings who live close by.  Patient appears more calm and denies any irritability or frustration since she has freedom living by herself.  She is tolerating all her medication and reported no tremors, shakes or any EPS.  Her appetite is okay.  Energy level is good.     Past Psychiatric History: Reviewed. H/O bipolar disorder.  Seeing in this office since 2008.  Tried Lexapro, Cymbalta, Paxil, Zoloft, Geodon and Lamictal (rash). Lithium helped but stopped due to high creatinine. No h/o suicidal attempt.  Tried Celexa worked very well but it was stopped due to interaction with Nexium.   Psychiatric Specialty Exam: Physical Exam  Review of Systems  Weight 154 lb (69.9 kg), last menstrual period 09/23/1988.There is no height or weight on file to calculate BMI.  General Appearance: NA  Eye Contact:  NA  Speech:  Slow   Volume:  Normal  Mood:  Euthymic  Affect:  NA  Thought Process:  Goal Directed  Orientation:  Full (Time, Place, and Person)  Thought Content:  Logical  Suicidal Thoughts:  No  Homicidal Thoughts:  No  Memory:  Immediate;   Good Recent;   Good Remote;   Fair  Judgement:  Intact  Insight:  Present  Psychomotor Activity:  NA  Concentration:  Concentration: Fair and Attention Span: Fair  Recall:  Good  Fund of Knowledge:  Fair  Language:  Good  Akathisia:  No  Handed:  Right  AIMS (if indicated):     Assets:  Communication Skills Desire for Improvement Housing Resilience Social Support  ADL's:  Intact  Cognition:  WNL  Sleep:   good      Assessment and Plan: Bipolar disorder type I.  Generalized anxiety disorder.  Reinforce blood work.  Patient overall feeling better since she had her own place.  She is not taking trazodone sleeping better.  Continue Seroquel 300 mg at bedtime, Prozac 40 mg daily and Tegretol 100 mg twice a day.  Recommended to call us back if she has any question or any concern.  Follow-up in 3 months.  Follow Up Instructions:    I discussed the assessment and treatment plan with the patient. The patient was provided an opportunity to ask questions and all were answered. The patient agreed with the plan and demonstrated an understanding of the instructions.  The patient was advised to call back or seek an in-person evaluation if the symptoms worsen or if the condition fails to improve as anticipated.  I provided 15 minutes of non-face-to-face time during this encounter.   Kathlee Nations, MD

## 2021-04-19 DIAGNOSIS — H25033 Anterior subcapsular polar age-related cataract, bilateral: Secondary | ICD-10-CM | POA: Diagnosis not present

## 2021-04-19 DIAGNOSIS — H40053 Ocular hypertension, bilateral: Secondary | ICD-10-CM | POA: Diagnosis not present

## 2021-04-24 ENCOUNTER — Other Ambulatory Visit: Payer: Self-pay | Admitting: Family

## 2021-05-16 ENCOUNTER — Ambulatory Visit: Payer: Medicare Other | Admitting: Family

## 2021-05-18 ENCOUNTER — Ambulatory Visit: Payer: Medicare Other | Admitting: Family

## 2021-05-22 ENCOUNTER — Ambulatory Visit (HOSPITAL_BASED_OUTPATIENT_CLINIC_OR_DEPARTMENT_OTHER): Payer: Medicare Other

## 2021-05-22 ENCOUNTER — Other Ambulatory Visit (HOSPITAL_BASED_OUTPATIENT_CLINIC_OR_DEPARTMENT_OTHER): Payer: Medicare Other

## 2021-05-24 ENCOUNTER — Telehealth: Payer: Self-pay | Admitting: Family

## 2021-05-24 NOTE — Telephone Encounter (Signed)
No answer, unable to leave a message for patient to call back and schedule Medicare Annual Wellness Visit (AWV) to be done by telephone.  No hx of AWV eligible as of 02/22/16  Please schedule at anytime with [facility].      40 Minutes appointment   Any questions, please call me at [phone]

## 2021-06-12 ENCOUNTER — Ambulatory Visit (HOSPITAL_BASED_OUTPATIENT_CLINIC_OR_DEPARTMENT_OTHER): Payer: Medicare Other

## 2021-06-12 ENCOUNTER — Other Ambulatory Visit (HOSPITAL_BASED_OUTPATIENT_CLINIC_OR_DEPARTMENT_OTHER): Payer: Medicare Other

## 2021-06-14 DIAGNOSIS — Z950 Presence of cardiac pacemaker: Secondary | ICD-10-CM | POA: Diagnosis not present

## 2021-07-17 ENCOUNTER — Inpatient Hospital Stay (HOSPITAL_BASED_OUTPATIENT_CLINIC_OR_DEPARTMENT_OTHER): Admission: RE | Admit: 2021-07-17 | Payer: Medicare Other | Source: Ambulatory Visit

## 2021-07-17 ENCOUNTER — Other Ambulatory Visit (HOSPITAL_BASED_OUTPATIENT_CLINIC_OR_DEPARTMENT_OTHER): Payer: Medicare Other

## 2021-07-17 ENCOUNTER — Ambulatory Visit: Payer: Medicare Other | Admitting: Family

## 2021-07-18 ENCOUNTER — Telehealth: Payer: Self-pay | Admitting: Family

## 2021-07-18 ENCOUNTER — Telehealth (HOSPITAL_BASED_OUTPATIENT_CLINIC_OR_DEPARTMENT_OTHER): Payer: Medicare Other | Admitting: Psychiatry

## 2021-07-18 ENCOUNTER — Encounter (HOSPITAL_COMMUNITY): Payer: Self-pay | Admitting: Psychiatry

## 2021-07-18 ENCOUNTER — Ambulatory Visit (INDEPENDENT_AMBULATORY_CARE_PROVIDER_SITE_OTHER): Payer: Medicare Other | Admitting: Family

## 2021-07-18 ENCOUNTER — Other Ambulatory Visit: Payer: Self-pay

## 2021-07-18 VITALS — BP 126/82 | HR 106 | Temp 98.6°F | Resp 16 | Wt 158.0 lb

## 2021-07-18 DIAGNOSIS — E785 Hyperlipidemia, unspecified: Secondary | ICD-10-CM

## 2021-07-18 DIAGNOSIS — Z720 Tobacco use: Secondary | ICD-10-CM | POA: Diagnosis not present

## 2021-07-18 DIAGNOSIS — E039 Hypothyroidism, unspecified: Secondary | ICD-10-CM

## 2021-07-18 DIAGNOSIS — I10 Essential (primary) hypertension: Secondary | ICD-10-CM

## 2021-07-18 DIAGNOSIS — R739 Hyperglycemia, unspecified: Secondary | ICD-10-CM

## 2021-07-18 DIAGNOSIS — F411 Generalized anxiety disorder: Secondary | ICD-10-CM

## 2021-07-18 DIAGNOSIS — F3131 Bipolar disorder, current episode depressed, mild: Secondary | ICD-10-CM

## 2021-07-18 DIAGNOSIS — R0902 Hypoxemia: Secondary | ICD-10-CM | POA: Diagnosis not present

## 2021-07-18 DIAGNOSIS — K219 Gastro-esophageal reflux disease without esophagitis: Secondary | ICD-10-CM

## 2021-07-18 DIAGNOSIS — F32A Depression, unspecified: Secondary | ICD-10-CM | POA: Diagnosis not present

## 2021-07-18 LAB — LIPID PANEL
Cholesterol: 169 mg/dL (ref 0–200)
HDL: 42.8 mg/dL (ref >39.00)
LDL Cholesterol: 99 mg/dL (ref 0–99)
NonHDL: 126.15
Total CHOL/HDL Ratio: 4
Triglycerides: 134 mg/dL (ref 0.0–149.0)
VLDL: 26.8 mg/dL (ref 0.0–40.0)

## 2021-07-18 LAB — TSH: TSH: 2.12 u[IU]/mL (ref 0.35–5.50)

## 2021-07-18 LAB — HEMOGLOBIN A1C: Hgb A1c MFr Bld: 6.4 % (ref 4.6–6.5)

## 2021-07-18 MED ORDER — VARENICLINE TARTRATE 0.5 MG PO TABS: ORAL_TABLET | ORAL | 0 refills | Status: DC

## 2021-07-18 MED ORDER — CARBAMAZEPINE ER 100 MG PO TB12: 100.0000 mg | ORAL_TABLET | Freq: Two times a day (BID) | ORAL | 0 refills | Status: AC

## 2021-07-18 MED ORDER — FLUOXETINE HCL 40 MG PO CAPS: 40.0000 mg | ORAL_CAPSULE | Freq: Every day | ORAL | 0 refills | Status: AC

## 2021-07-18 MED ORDER — QUETIAPINE FUMARATE 300 MG PO TABS: ORAL_TABLET | ORAL | 0 refills | Status: DC

## 2021-07-18 NOTE — Assessment & Plan Note (Signed)
Fair control.  Has telephone visit with psychiatry.

## 2021-07-18 NOTE — Assessment & Plan Note (Signed)
Uncontrolled.  Eats healthy choice meals.  Recommended continue ompeprazole 40mg  and add tums prn.

## 2021-07-18 NOTE — Progress Notes (Signed)
Subjective:   By signing my name below, I, Shehryar Baig, attest that this documentation has been prepared under the direction and in the presence of Debbrah Alar NP. 07/18/2021      Patient ID: Kathryn Buckley, female    DOB: 09-25-1950, 70 y.o.   MRN: [id number]  No chief complaint on file.   HPI Patient is in today for a office visit.   Oxygen- She continues using oxygen while at home.  She rarely uses her albuterol inhaler. She continues smoking 2 packs daily. She is using nicotine gum to help her quit smoking but notes having sleeping problems while trying to quit. She has tried nicotine patches previously to try and quit smoking.  Blood pressure- Her blood pressure is doing well during this visit. She continues taking 5 mg amlodipine daily PO and reports no new issues while taking it.   BP Readings from Last 3 Encounters:  07/18/21 126/82  02/20/21 130/65  10/06/20 117/80   Pulse Readings from Last 3 Encounters:  07/18/21 (!) 106  02/20/21 60  10/06/20 61   Cholesterol- She continues taking 40 mg lipitor daily PO and reports no new issues while taking it.   Lab Results  Component Value Date   CHOL 166 10/06/2020   HDL 37.90 (L) 10/06/2020   LDLCALC 98 10/06/2020   LDLDIRECT 134.0 03/01/2016   TRIG 151.0 (H) 10/06/2020   CHOLHDL 4 10/06/2020   [place]- She continues taking 40 mg omeprazole daily PO and reports having continued indigestion. She does not use OTC TUMs to manage her symptoms. She reports maintaining a healthy diet at this time.  Depression- She is struggling sleeping at this time. She reports doing well while seeing her psychiatrist specialist, Dr. Adele Schilder.  Thyroid- She continues taking 125 mg synthroid daily PO and reports doing well while taking it.  Immunizations- She received her flu vaccine and bivalent Covid-19 vaccine last week.    Health Maintenance Due  Topic Date Due   OPHTHALMOLOGY EXAM  Never done   Zoster Vaccines- Shingrix (1  of 2) Never done   COLONOSCOPY (Pts 45-59yrs Insurance coverage will need to be confirmed)  Never done   MAMMOGRAM  Never done   DEXA SCAN  Never done   Pneumonia Vaccine 36+ Years old (3 - PPSV23 if available, else PCV20) 09/02/2017   HEMOGLOBIN A1C  04/05/2021    Past Medical History:  Diagnosis Date   COPD (chronic obstructive pulmonary disease) ([place])    Depression    HTN (hypertension)    Hyperlipemia    Seizures ([place])    Due to brain tumor that was removed in 2004   Thyroid disease     Past Surgical History:  Procedure Laterality Date   BRAIN SURGERY     age 50, she reports a brain tumor removal.      Family History  Problem Relation Age of Onset   Bipolar disorder Father    Heart disease Father    Cancer Father        prostate   Heart disease Mother    Hypertension Brother    Diabetes Brother    Diabetes Mellitus II Sister    Stroke Neg Hx    Hyperlipidemia Neg Hx     Social History   Socioeconomic History   Marital status: Married    Spouse name: Not on file   Number of children: Not on file   Years of education: Not on file   Highest education level:  Not on file  Occupational History   Not on file  Tobacco Use   Smoking status: Every Day    Packs/day: 2.50    Years: 40.00    Pack years: 100.00    Types: Cigarettes    Last attempt to quit: 03/14/2016    Years since quitting: 5.3   Smokeless tobacco: Never  Vaping Use   Vaping Use: Never used  Substance and Sexual Activity   Alcohol use: Yes    Alcohol/week: 2.0 - 3.0 standard drinks    Types: 2 - 3 Cans of beer per week    Comment: Occasional beer   Drug use: No   Sexual activity: Not Currently  Other Topics Concern   Not on file  Social History Narrative   Widowed   and 2 chihuahua   She has 2 children- one son died of drug overdose   1 living son- [name]- lives in [place].     2 step sons   She has worked in the past in Research [employer], trade [employer])   She enjoys TV- likes to be  home.         Social Determinants of Health   Financial Resource Strain: Not on file  Food Insecurity: Not on file  Transportation Needs: Not on file  Physical Activity: Not on file  Stress: Not on file  Social Connections: Not on file  Intimate Partner Violence: Not on file    Outpatient Medications Prior to Visit  Medication Sig Dispense Refill   Acetaminophen (TYLENOL 8 HOUR PO) Take by mouth.     albuterol (VENTOLIN HFA) 108 (90 Base) MCG/ACT inhaler Inhale 2 puffs into the lungs every 6 (six) hours as needed for wheezing or shortness of breath. 8 g 5   amLODipine (NORVASC) 5 MG tablet TAKE 1 TABLET BY MOUTH DAILY 90 tablet 1   atorvastatin (LIPITOR) 40 MG tablet TAKE 1 TABLET BY MOUTH EVERY DAY 90 tablet 1   carbamazepine (TEGRETOL XR) 100 MG 12 hr tablet Take 1 tablet (100 mg total) by mouth 2 (two) times daily. 180 tablet 0   FLUoxetine (PROZAC) 40 MG capsule Take 1 capsule (40 mg total) by mouth daily. 90 capsule 0   levothyroxine (SYNTHROID) 125 MCG tablet TAKE 1 TABLET(125 MCG) BY MOUTH DAILY BEFORE BREAKFAST 90 tablet 1   loratadine (CLARITIN) 10 MG tablet Take 10 mg by mouth daily.     omeprazole (PRILOSEC) 40 MG capsule TAKE 1 CAPSULE(40 MG) BY MOUTH DAILY 90 capsule 1   QUEtiapine (SEROQUEL) 300 MG tablet TAKE 1 TABLET BY MOUTH AT BEDTIME 90 tablet 0   traZODone (DESYREL) 100 MG tablet Take 0.5-1 tablets (50-100 mg total) by mouth at bedtime. (Patient not taking: Reported on 04/17/2021) 30 tablet 0   TRELEGY ELLIPTA 100-62.5-25 MCG/INH AEPB Take 1 puff by mouth daily. 28 each 5   Wheat Dextrin (BENEFIBER PO) Take by mouth.     Zoster Vaccine Adjuvanted [facility]) injection Inject 0.5mg  IM now and again in 2-6 months. 0.5 mL 1   No facility-administered medications prior to visit.    Allergies  Allergen Reactions   Lamictal [Lamotrigine] Rash   Codeine     Other reaction(s): GI Upset (intolerance)    ROS     Objective:    Physical Exam Constitutional:       General: She is not in acute distress.    Appearance: Normal appearance. She is not ill-appearing.  HENT:     Head: Normocephalic and atraumatic.  Right Ear: External ear normal.     Left Ear: External ear normal.  Eyes:     Extraocular Movements: Extraocular movements intact.     Pupils: Pupils are equal, round, and reactive to light.  Cardiovascular:     Rate and Rhythm: Normal rate and regular rhythm.     Heart sounds: Normal heart sounds. No murmur heard.   No gallop.  Pulmonary:     Effort: Pulmonary effort is normal. No respiratory distress.     Breath sounds: Normal breath sounds. No wheezing or rales.     Comments: O2 levels dropped to 86% while walking O2 levels were 94% while resting Skin:    General: Skin is warm and dry.  Neurological:     Mental Status: She is alert and oriented to person, place, and time.  Psychiatric:        Behavior: Behavior normal.        Judgment: Judgment normal.    BP 126/82 (BP Location: Right Arm, Patient Position: Sitting, Cuff Size: Small)   Pulse (!) 106   Temp 98.6 F (37 C) (Oral)   Resp 16   Wt 158 lb (71.7 kg)   LMP 09/23/1988   SpO2 95% Comment: waling on 2 litters of oxygen  BMI 22.67 kg/m  Wt Readings from Last 3 Encounters:  07/18/21 158 lb (71.7 kg)  02/20/21 154 lb (69.9 kg)  10/06/20 151 lb (68.5 kg)       Assessment & Plan:   Problem List Items Addressed This Visit       Unprioritized   Tobacco abuse    Uncontrolled.  >2 PPD.  Trial of chantix.       Hypoxia - Primary   Relevant Orders   For home use only DME oxygen   Hypothyroid    Lab Results  Component Value Date   TSH 1.04 10/06/2020  Clinically stable on synthroid 152mcg.  Continue same.       Relevant Orders   TSH   Hyperlipidemia    Lab Results  Component Value Date   CHOL 166 10/06/2020   HDL 37.90 (L) 10/06/2020   LDLCALC 98 10/06/2020   LDLDIRECT 134.0 03/01/2016   TRIG 151.0 (H) 10/06/2020   CHOLHDL 4 10/06/2020  Tolerating  atorvastatin.  Continue same.       Relevant Orders   Lipid panel   Hyperglycemia    Lab Results  Component Value Date   HGBA1C 6.2 10/06/2020        Relevant Orders   Hemoglobin A1c   HTN (hypertension)    BP Readings from Last 3 Encounters:  07/18/21 126/82  02/20/21 130/65  10/06/20 117/80  Stable on amlodipine 5mg .       GERD (gastroesophageal reflux disease)    Uncontrolled.  Eats healthy choice meals.  Recommended continue ompeprazole 40mg  and add tums prn.       Depression    Fair control.  Has telephone visit with psychiatry.       Relevant Orders   Carbamazepine Level (Tegretol), total     Meds ordered this encounter  Medications   DISCONTD: varenicline (CHANTIX) 0.5 MG tablet    Sig: Days 1 to 3: 0.5 mg (one tab) once daily. Days 4 to 7: 0.5 mg (one tab) twice daily. Maintenance (day 8 and later): 2 tabs by mouth twice daily    Dispense:  120 tablet    Refill:  0    Order Specific Question:   Supervising Provider  Answer:   Penni Homans A [[id number]]   varenicline (CHANTIX) 0.5 MG tablet    Sig: Days 1 to 3: 0.5 mg (one tab) once daily. Days 4 to 7: 0.5 mg (one tab) twice daily. Maintenance (day 8 and later): 2 tabs by mouth twice daily    Dispense:  120 tablet    Refill:  0    Order Specific Question:   Supervising Provider    Answer:   Penni Homans A [[id number]]    I, Debbrah Alar NP, personally preformed the services described in this documentation.  All medical record entries made by the scribe were at my direction and in my presence.  I have reviewed the chart and discharge instructions (if applicable) and agree that the record reflects my personal performance and is accurate and complete. 07/18/2021   I,Shehryar Baig,acting as a Education administrator for Nance Pear, NP.,have documented all relevant documentation on the behalf of Nance Pear, NP,as directed by  Nance Pear, NP while in the presence of Nance Pear,  NP.   Nance Pear, NP

## 2021-07-18 NOTE — Assessment & Plan Note (Signed)
Lab Results  Component Value Date   TSH 1.04 10/06/2020   Clinically stable on synthroid 137mcg.  Continue same.

## 2021-07-18 NOTE — Assessment & Plan Note (Signed)
Uncontrolled.  >2 PPD.  Trial of chantix.

## 2021-07-18 NOTE — Patient Instructions (Signed)
Please complete lab work prior to leaving.   

## 2021-07-18 NOTE — Assessment & Plan Note (Signed)
Lab Results  Component Value Date   CHOL 166 10/06/2020   HDL 37.90 (L) 10/06/2020   LDLCALC 98 10/06/2020   LDLDIRECT 134.0 03/01/2016   TRIG 151.0 (H) 10/06/2020   CHOLHDL 4 10/06/2020   Tolerating atorvastatin.  Continue same.

## 2021-07-18 NOTE — Assessment & Plan Note (Signed)
BP Readings from Last 3 Encounters:  07/18/21 126/82  02/20/21 130/65  10/06/20 117/80   Stable on amlodipine 5mg .

## 2021-07-18 NOTE — Assessment & Plan Note (Signed)
Lab Results  Component Value Date   HGBA1C 6.2 10/06/2020

## 2021-07-18 NOTE — Telephone Encounter (Signed)
Please cancel chantix at [facility]. She switched pharmacies.

## 2021-07-18 NOTE — Progress Notes (Signed)
Virtual Visit via Telephone Note  I connected with Kathryn Buckley on 07/18/21 at  3:00 PM EDT by telephone and verified that I am speaking with the correct person using two identifiers.  Location: Patient: Home Provider: Home Office   I discussed the limitations, risks, security and privacy concerns of performing an evaluation and management service by telephone and the availability of in person appointments. I also discussed with the patient that there may be a patient responsible charge related to this service. The patient expressed understanding and agreed to proceed.   History of Present Illness: Patient is evaluated by phone session.  She is taking Seroquel, Prozac and Tegretol.  Today she had a visit with her PCP and she had blood work but results are still pending.  Patient like to stop the smoking and PCP started her on Chantix.  However explained that sometimes Chantix can cause worsening of mood symptoms and she need to be very careful when she stopped Chantix.  Overall she feels things are okay she had a good support from her siblings who live close by.  She admitted there are nights when she does not sleep as good even though she had a new mattress.  But she does not want to add more medication.  She used to take trazodone but it was stopped.  She denies any mania, psychosis, hallucination.  She denies any crying spells or any feeling of hopelessness.  She is hoping her son comes from Thanksgiving.  She had a plan to spend time with her relatives on Christmas.  Her energy level is good.  She admitted some financial stress as everything is very expensive and hoping stopping smoking helps her financially.    Past Psychiatric History: Reviewed. H/O bipolar disorder.  Seeing in this office since 2008.  Tried Lexapro, Cymbalta, Paxil, Zoloft, Geodon and Lamictal (rash). Lithium helped but stopped due to high creatinine. No h/o suicidal attempt.  Tried Celexa worked very well but it was  stopped due to interaction with Nexium.   Psychiatric Specialty Exam: Physical Exam  Review of Systems  Weight 158 lb (71.7 kg), last menstrual period 09/23/1988.There is no height or weight on file to calculate BMI.  General Appearance: NA  Eye Contact:  NA  Speech:  Clear and Coherent  Volume:  Normal  Mood:  Anxious  Affect:  NA  Thought Process:  Goal Directed  Orientation:  Full (Time, Place, and Person)  Thought Content:  WDL  Suicidal Thoughts:  No  Homicidal Thoughts:  No  Memory:  Immediate;   Good Recent;   Good Remote;   Fair  Judgement:  Intact  Insight:  Present  Psychomotor Activity:  NA  Concentration:  Concentration: Fair and Attention Span: Fair  Recall:  Good  Fund of Knowledge:  Good  Language:  Good  Akathisia:  No  Handed:  Right  AIMS (if indicated):     Assets:  Communication Skills Desire for Improvement Housing Resilience Social Support Transportation  ADL's:  Intact  Cognition:  WNL  Sleep:   fair      Assessment and Plan: Bipolar disorder type I.  Generalized anxiety disorder.  Patient had blood work today but results pending.  Patient is stable on her current medication except a few nights when she has struggled with insomnia.  Discussed medication side effects and benefits.  Continue Seroquel 300 mg at bedtime, Prozac 40 mg daily and Tegretol 100 g twice a day.  Patient is prescribed Chantix and  explained about risks and benefits with a given history of mood symptoms.  Patient understand and agree to give [place] a call back if worsening of symptoms.  Follow up in 3 months  Follow Up Instructions:    I discussed the assessment and treatment plan with the patient. The patient was provided an opportunity to ask questions and all were answered. The patient agreed with the plan and demonstrated an understanding of the instructions.   The patient was advised to call back or seek an in-person evaluation if the symptoms worsen or if the condition  fails to improve as anticipated.  I provided 18 minutes of non-face-to-face time during this encounter.   Kathlee Nations, MD

## 2021-07-18 NOTE — Telephone Encounter (Signed)
Rx cancelled.

## 2021-07-19 ENCOUNTER — Encounter: Payer: Self-pay | Admitting: Family

## 2021-07-19 LAB — CARBAMAZEPINE LEVEL, TOTAL: Carbamazepine Lvl: 3 mg/L — ABNORMAL LOW (ref 4.0–12.0)

## 2021-07-20 ENCOUNTER — Telehealth (HOSPITAL_COMMUNITY): Payer: Self-pay | Admitting: Psychiatry

## 2021-07-20 NOTE — Progress Notes (Signed)
Mailed out to patient 

## 2021-07-20 NOTE — Telephone Encounter (Signed)
Recent Results (from the past 2160 hour(s))  TSH     Status: None   Collection Time: 07/18/21 11:58 AM  Result Value Ref Range   TSH 2.12 0.35 - 5.50 uIU/mL  Hemoglobin A1c     Status: None   Collection Time: 07/18/21 11:58 AM  Result Value Ref Range   Hgb A1c MFr Bld 6.4 4.6 - 6.5 %    Comment: Glycemic Control Guidelines for People with Diabetes:Non Diabetic:  <6%Goal of Therapy: <7%Additional Action Suggested:  >8%   Lipid panel     Status: None   Collection Time: 07/18/21 11:58 AM  Result Value Ref Range   Cholesterol 169 0 - 200 mg/dL    Comment: ATP III Classification       Desirable:  < 200 mg/dL               Borderline High:  200 - 239 mg/dL          High:  > = 240 mg/dL   Triglycerides 134.0 0.0 - 149.0 mg/dL    Comment: Normal:  <150 mg/dLBorderline High:  150 - 199 mg/dL   HDL 42.80 >39.00 mg/dL   VLDL 26.8 0.0 - 40.0 mg/dL   LDL Cholesterol 99 0 - 99 mg/dL   Total CHOL/HDL Ratio 4     Comment:                Men          Women1/2 Average Risk     3.4          3.3Average Risk          5.0          4.42X Average Risk          9.6          7.13X Average Risk          15.0          11.0                       NonHDL 126.15     Comment: NOTE:  Non-HDL goal should be 30 mg/dL higher than patient's LDL goal (i.e. LDL goal of < 70 mg/dL, would have non-HDL goal of < 100 mg/dL)  Carbamazepine Level (Tegretol), total     Status: Abnormal   Collection Time: 07/18/21 11:58 AM  Result Value Ref Range   Carbamazepine Lvl 3.0 (L) 4.0 - 12.0 mg/L     Labs reviewed. Her tegretol level is sub therapeutic. Please ask her to take one tegretol tab in am and take two tab at bed time. Thanks

## 2021-07-24 ENCOUNTER — Telehealth: Payer: Self-pay | Admitting: Family

## 2021-07-24 ENCOUNTER — Other Ambulatory Visit: Payer: Self-pay

## 2021-07-24 MED ORDER — AMLODIPINE BESYLATE 5 MG PO TABS: 5.0000 mg | ORAL_TABLET | Freq: Every day | ORAL | 1 refills | Status: DC

## 2021-07-24 MED ORDER — ATORVASTATIN CALCIUM 40 MG PO TABS: ORAL_TABLET | ORAL | 1 refills | Status: DC

## 2021-07-24 NOTE — Telephone Encounter (Signed)
Patient advised we are working on her DME orders for oxygen. We will call her back as soon as we have some information.  Prescription refills sent

## 2021-07-24 NOTE — Telephone Encounter (Signed)
Pt stated she has not heard back about her lab results, has not been contacted by an [facility], and she also needed two refills. Please advise.    Medication: atorvastatin (LIPITOR) 40 MG tablet And  amLODipine (NORVASC) 5 MG tablet Has the patient contacted their pharmacy? Yes.    Preferred Pharmacy: [facility] [address]  [place], [place] [address]  Phone:  [phone]  Fax:  [phone]

## 2021-07-24 NOTE — Addendum Note (Signed)
Addended by: Jiles Prows on: 07/24/2021 02:23 PM   Modules accepted: Orders

## 2021-08-13 ENCOUNTER — Telehealth: Payer: Self-pay | Admitting: Family

## 2021-08-13 NOTE — Telephone Encounter (Signed)
Information given to patient as:  Sent to [address] [place], [place] [address] [phone]

## 2021-08-13 NOTE — Telephone Encounter (Signed)
Pt stated new company will need an swo form signed by [place]. Please advise.

## 2021-08-13 NOTE — Telephone Encounter (Signed)
Pt. Wanted to get information on company that will be supplying her oxygen. She states they called her and stated they would call back and they haven't. She just wants to follow up with them but doesn't have any information regarding the company.

## 2021-08-21 ENCOUNTER — Telehealth: Payer: Self-pay | Admitting: Family

## 2021-08-21 NOTE — Telephone Encounter (Signed)
Error

## 2021-08-21 NOTE — Telephone Encounter (Signed)
Spoke to [place] at [facility], she will fax form needed

## 2021-08-21 NOTE — Telephone Encounter (Signed)
Lvm at [facility] with Tammy (overflow representative). She said she will have someone call me back about her oxygen referral.

## 2021-08-28 NOTE — Telephone Encounter (Signed)
New forms received, signed by pcp and faxed back this morning.

## 2021-08-31 DIAGNOSIS — J449 Chronic obstructive pulmonary disease, unspecified: Secondary | ICD-10-CM | POA: Diagnosis not present

## 2021-09-04 ENCOUNTER — Ambulatory Visit (INDEPENDENT_AMBULATORY_CARE_PROVIDER_SITE_OTHER): Payer: Medicare Other

## 2021-09-04 ENCOUNTER — Other Ambulatory Visit: Payer: Self-pay

## 2021-09-04 DIAGNOSIS — E2839 Other primary ovarian failure: Secondary | ICD-10-CM | POA: Diagnosis not present

## 2021-09-04 DIAGNOSIS — Z0000 Encounter for general adult medical examination without abnormal findings: Secondary | ICD-10-CM | POA: Diagnosis not present

## 2021-09-04 DIAGNOSIS — Z1231 Encounter for screening mammogram for malignant neoplasm of breast: Secondary | ICD-10-CM

## 2021-09-04 DIAGNOSIS — Z1211 Encounter for screening for malignant neoplasm of colon: Secondary | ICD-10-CM | POA: Diagnosis not present

## 2021-09-04 NOTE — Progress Notes (Signed)
Virtual Visit via Telephone Note  I connected with  Kathryn Buckley on 09/04/21 at  3:15 PM EST by telephone and verified that I am speaking with the correct person using two identifiers.  Medicare Annual Wellness visit completed telephonically due to Covid-19 pandemic.   Persons participating in this call: This Health Coach and this patient.   Location: Patient: Home Provider: Office   I discussed the limitations, risks, security and privacy concerns of performing an evaluation and management service by telephone and the availability of in person appointments. The patient expressed understanding and agreed to proceed.  Unable to perform video visit due to video visit attempted and failed and/or patient does not have video capability.   Some vital signs may be absent or patient reported.   Willette Brace, LPN   Subjective:   Kathryn Buckley is a 70 y.o. female who presents for an Initial Medicare Annual Wellness Visit.  Review of Systems     Cardiac Risk Factors include: advanced age (>49men, >78 women);hypertension;dyslipidemia;diabetes mellitus     Objective:    There were no vitals filed for this visit. There is no height or weight on file to calculate BMI.  Advanced Directives 09/04/2021  Does Patient Have a Medical Advance Directive? No  Would patient like information on creating a medical advance directive? Yes (MAU/Ambulatory/Procedural Areas - Information given)  Some encounter information is confidential and restricted. Go to Review Flowsheets activity to see all data.    Current Medications (verified) Outpatient Encounter Medications as of 09/04/2021  Medication Sig   Acetaminophen (TYLENOL 8 HOUR PO) Take by mouth.   albuterol (VENTOLIN HFA) 108 (90 Base) MCG/ACT inhaler Inhale 2 puffs into the lungs every 6 (six) hours as needed for wheezing or shortness of breath.   amLODipine (NORVASC) 5 MG tablet Take 1 tablet (5 mg total) by mouth daily.    atorvastatin (LIPITOR) 40 MG tablet TAKE 1 TABLET BY MOUTH EVERY DAY   carbamazepine (TEGRETOL XR) 100 MG 12 hr tablet Take 1 tablet (100 mg total) by mouth 2 (two) times daily.   FLUoxetine (PROZAC) 40 MG capsule Take 1 capsule (40 mg total) by mouth daily.   levothyroxine (SYNTHROID) 125 MCG tablet TAKE 1 TABLET(125 MCG) BY MOUTH DAILY BEFORE BREAKFAST   omeprazole (PRILOSEC) 40 MG capsule TAKE 1 CAPSULE(40 MG) BY MOUTH DAILY   QUEtiapine (SEROQUEL) 300 MG tablet TAKE 1 TABLET BY MOUTH AT BEDTIME   TRELEGY ELLIPTA 100-62.5-25 MCG/INH AEPB Take 1 puff by mouth daily.   loratadine (CLARITIN) 10 MG tablet Take 10 mg by mouth daily. (Patient not taking: Reported on 09/04/2021)   varenicline (CHANTIX) 0.5 MG tablet Days 1 to 3: 0.5 mg (one tab) once daily. Days 4 to 7: 0.5 mg (one tab) twice daily. Maintenance (day 8 and later): 2 tabs by mouth twice daily (Patient not taking: Reported on 09/04/2021)   Zoster Vaccine Adjuvanted [facility]) injection Inject 0.5mg  IM now and again in 2-6 months.   [DISCONTINUED] traZODone (DESYREL) 100 MG tablet Take 0.5-1 tablets (50-100 mg total) by mouth at bedtime. (Patient not taking: Reported on 04/17/2021)   [DISCONTINUED] Wheat Dextrin (BENEFIBER PO) Take by mouth. (Patient not taking: Reported on 09/04/2021)   No facility-administered encounter medications on file as of 09/04/2021.    Allergies (verified) Lamictal [lamotrigine] and Codeine   History: Past Medical History:  Diagnosis Date   COPD (chronic obstructive pulmonary disease) (HCC)    Depression    HTN (hypertension)    Hyperlipemia  Seizures ([facility])    Due to brain tumor that was removed in 2004   Thyroid disease    Past Surgical History:  Procedure Laterality Date   BRAIN SURGERY     age 79, she reports a brain tumor removal.     Family History  Problem Relation Age of Onset   Bipolar disorder Father    Heart disease Father    Cancer Father        prostate   Heart disease Mother     Hypertension Brother    Diabetes Brother    Diabetes Mellitus II Sister    Stroke Neg Hx    Hyperlipidemia Neg Hx    Social History   Socioeconomic History   Marital status: Married    Spouse name: Not on file   Number of children: Not on file   Years of education: Not on file   Highest education level: Not on file  Occupational History   Not on file  Tobacco Use   Smoking status: Every Day    Packs/day: 2.00    Years: 40.00    Pack years: 80.00    Types: Cigarettes    Last attempt to quit: 03/14/2016    Years since quitting: 5.4   Smokeless tobacco: Never  Vaping Use   Vaping Use: Never used  Substance and Sexual Activity   Alcohol use: Yes    Alcohol/week: 2.0 - 3.0 standard drinks    Types: 2 - 3 Cans of beer per week    Comment: Occasional beer   Drug use: No   Sexual activity: Not Currently  Other Topics Concern   Not on file  Social History Narrative   Widowed   and 2 chihuahua   She has 2 children- one son died of drug overdose   1 living son- Kathryn Buckley- lives in [place].     2 step sons   She has worked in the past in Research officer, trade union)   She enjoys TV- likes to be home.         Social Determinants of Health   Financial Resource Strain: Low Risk    Difficulty of Paying Living Expenses: Not hard at all  Food Insecurity: No Food Insecurity   Worried About Charity fundraiser in the Last Year: Never true   [place] in the Last Year: Never true  Transportation Needs: No Transportation Needs   Lack of Transportation (Medical): No   Lack of Transportation (Non-Medical): No  Physical Activity: Inactive   Days of Exercise per Week: 0 days   Minutes of Exercise per Session: 0 min  Stress: Stress Concern Present   Feeling of Stress : To some extent  Social Connections: Socially Isolated   Frequency of Communication with Friends and Family: More than three times a week   Frequency of Social Gatherings with Friends and Family: Twice a  week   Attends Religious Services: Never   Marine scientist or Organizations: No   Attends Archivist Meetings: Never   Marital Status: Widowed    Tobacco Counseling Ready to quit: Not Answered Counseling given: Not Answered   Clinical Intake:  Pre-visit preparation completed: Yes  Pain : No/denies pain     BMI - recorded: 22.67 Nutritional Status: BMI of 19-24  Normal Nutritional Risks: Nausea/ vomitting/ diarrhea (nausea) Diabetes: Yes CBG done?: No Did pt. bring in CBG monitor from home?: No  How often do you need to have someone  help you when you read instructions, pamphlets, or other written materials from your doctor or pharmacy?: 1 - Never  Diabetic?Nutrition Risk Assessment:  Has the patient had any N/V/D within the last 2 months?  Yes  nausea Does the patient have any non-healing wounds?  No  Has the patient had any unintentional weight loss or weight gain?  No   Diabetes:  Is the patient diabetic?  Yes  If diabetic, was a CBG obtained today?  No  Did the patient bring in their glucometer from home?  No  How often do you monitor your CBG's? N/A.   Financial Strains and Diabetes Management:  Are you having any financial strains with the device, your supplies or your medication? No .  Does the patient want to be seen by Chronic Care Management for management of their diabetes?  No  Would the patient like to be referred to a Nutritionist or for Diabetic Management?  No   Diabetic Exams:  Diabetic Eye Exam: Overdue for diabetic eye exam. Pt has been advised about the importance in completing this exam. Patient advised to call and schedule an eye exam. Ordered 10/06/20  Diabetic Foot Exam: Completed 09/26/20   Interpreter Needed?: No  Information entered by :: Charlott Rakes, LPN   Activities of Daily Living In your present state of health, do you have any difficulty performing the following activities: 09/04/2021  Hearing? N  Vision? N   Difficulty concentrating or making decisions? Y  Walking or climbing stairs? N  Dressing or bathing? N  Doing errands, shopping? N  Preparing Food and eating ? N  Using the Toilet? N  In the past six months, have you accidently leaked urine? N  Do you have problems with loss of bowel control? N  Managing your Medications? N  Managing your Finances? N  Housekeeping or managing your Housekeeping? N  Some recent data might be hidden    Patient Care Team: Debbrah Alar, NP as PCP - General (Internal Medicine) Adele Schilder, Arlyce Harman, MD as Consulting Physician (Psychiatry)  Indicate any recent Medical Services you may have received from other than [facility] providers in the past year (date may be approximate).     Assessment:   This is a routine wellness examination for Michille.  Hearing/Vision screen Hearing Screening - Comments:: Pt denies any hearing issues  Vision Screening - Comments:: Pt follows up with Dr Gavin Potters for annual eye exams   Dietary issues and exercise activities discussed:     Goals Addressed             This Visit's Progress    Patient Stated       Get strength back       Depression Screen PHQ 2/9 Scores 09/04/2021 02/20/2021 12/10/2017 03/01/2016  PHQ - 2 Score 2 2 4 4   PHQ- 9 Score 8 - 8 6    Fall Risk Fall Risk  09/04/2021 02/20/2021 10/06/2020 12/10/2017 03/01/2016  Falls in the past year? 1 0 0 No No  Number falls in past yr: 1 0 0 - -  Injury with Fall? 1 0 0 - -  Risk for fall due to : Impaired balance/gait;Impaired mobility;Impaired vision - - - -  Follow up Falls prevention discussed - - - -    FALL RISK PREVENTION PERTAINING TO THE HOME:  Any stairs in or around the home? No  If so, are there any without handrails? No  Home free of loose throw rugs in walkways, pet beds, electrical  cords, etc? Yes  Adequate lighting in your home to reduce risk of falls? Yes   ASSISTIVE DEVICES UTILIZED TO PREVENT FALLS:  Life alert? No  Use of a cane,  walker or w/c? Yes  Grab bars in the bathroom? Yes  Shower chair or bench in shower? No  Elevated toilet seat or a handicapped toilet? No   TIMED UP AND GO:  Was the test performed? No .   Cognitive Function:     6CIT Screen 09/04/2021  What Year? 0 points  What month? 0 points  What time? 0 points  Count back from 20 0 points  Months in reverse 0 points  Repeat phrase 0 points  Total Score 0    Immunizations Immunization History  Administered Date(s) Administered   Fluad Quad(high Dose 65+) 05/25/2019, 07/16/2021   Influenza, High Dose Seasonal PF 08/07/2015   Influenza, Seasonal, Injecte, Preservative Fre 09/02/2012   Influenza-Unspecified 08/17/2017, 08/02/2020   PFIZER(Purple Top)SARS-COV-2 Vaccination 11/22/2019, 11/25/2019, 08/02/2020, 07/16/2021   Pneumococcal Conjugate-13 08/07/2015   Pneumococcal Polysaccharide-23 09/02/2012   Tdap 10/12/2012    TDAP status: Up to date  Flu Vaccine status: Up to date  Pneumococcal vaccine status: Up to date  Covid-19 vaccine status: Completed vaccines  Qualifies for Shingles Vaccine? Yes   Zostavax completed No   Shingrix Completed?: No.    Education has been provided regarding the importance of this vaccine. Patient has been advised to call insurance company to determine out of pocket expense if they have not yet received this vaccine. Advised may also receive vaccine at local pharmacy or Health Dept. Verbalized acceptance and understanding.  Screening Tests Health Maintenance  Topic Date Due   OPHTHALMOLOGY EXAM  Never done   Zoster Vaccines- Shingrix (1 of 2) Never done   COLONOSCOPY (Pts 45-91yrs Insurance coverage will need to be confirmed)  Never done   MAMMOGRAM  Never done   DEXA SCAN  Never done   Pneumonia Vaccine 80+ Years old (3 - PPSV23 if available, else PCV20) 09/02/2017   URINE MICROALBUMIN  10/06/2021   COVID-19 Vaccine (5 - Booster) 09/10/2021   FOOT EXAM  10/06/2021   HEMOGLOBIN A1C  01/16/2022    TETANUS/TDAP  10/12/2022   INFLUENZA VACCINE  Completed   Hepatitis C Screening  Completed   HPV VACCINES  Aged Out    Health Maintenance  Health Maintenance Due  Topic Date Due   OPHTHALMOLOGY EXAM  Never done   Zoster Vaccines- Shingrix (1 of 2) Never done   COLONOSCOPY (Pts 45-67yrs Insurance coverage will need to be confirmed)  Never done   MAMMOGRAM  Never done   DEXA SCAN  Never done   Pneumonia Vaccine 52+ Years old (3 - PPSV23 if available, else PCV20) 09/02/2017   URINE MICROALBUMIN  10/06/2021    Colorectal cancer screening: Referral to GI placed 09/04/21. Pt aware the office will call re: appt.  Mammogram status: Ordered 02/20/21. Pt provided with contact info and advised to call to schedule appt.   Bone Density status: Ordered 02/20/21. Pt provided with contact info and advised to call to schedule appt.  Additional Screening:  Hepatitis C Screening: Completed 10/06/20  Vision Screening: Recommended annual ophthalmology exams for early detection of glaucoma and other disorders of the eye. Is the patient up to date with their annual eye exam?  Yes  Who is the provider or what is the name of the office in which the patient attends annual eye exams? Dr Gavin Potters If pt is not  established with a provider, would they like to be referred to a provider to establish care? No .   Dental Screening: Recommended annual dental exams for proper oral hygiene  Community Resource Referral / Chronic Care Management: CRR required this visit?  No   CCM required this visit?  No      Plan:     I have personally reviewed and noted the following in the patients chart:   Medical and social history Use of alcohol, tobacco or illicit drugs  Current medications and supplements including opioid prescriptions. Patient is not currently taking opioid prescriptions. Functional ability and status Nutritional status Physical activity Advanced directives List of other  physicians Hospitalizations, surgeries, and ER visits in previous 12 months Vitals Screenings to include cognitive, depression, and falls Referrals and appointments  In addition, I have reviewed and discussed with patient certain preventive protocols, quality metrics, and best practice recommendations. A written personalized care plan for preventive services as well as general preventive health recommendations were provided to patient.     Willette Brace, LPN   [id number]   Nurse Notes: None

## 2021-09-04 NOTE — Patient Instructions (Signed)
Kathryn Buckley , Thank you for taking time to come for your Medicare Wellness Visit. I appreciate your ongoing commitment to your health goals. Please review the following plan we discussed and let me know if I can assist you in the future.   Screening recommendations/referrals: Colonoscopy: order placed 09/04/21 Mammogram: Order placed 02/20/21 Bone Density: Order placed 02/20/21 Recommended yearly ophthalmology/optometry visit for glaucoma screening and checkup Recommended yearly dental visit for hygiene and checkup  Vaccinations: Influenza vaccine: 07/16/21 repeat every year Pneumococcal vaccine: Due and discussed Tdap vaccine: Done 10/12/12 repeat every 10 years  Shingles vaccine: Shingrix discussed. Please contact your pharmacy for coverage information.  Covid-19:Completed 3/1, 3/4, 11/010/21 & 07/16/21  Advanced directives: Please bring a copy of your health care power of attorney and living will to the office at your convenience.  Conditions/risks identified: get strength back   Next appointment: Follow up in one year for your annual wellness visit    Preventive Care 65 Years and Older, Female Preventive care refers to lifestyle choices and visits with your health care provider that can promote health and wellness. What does preventive care include? A yearly physical exam. This is also called an annual well check. Dental exams once or twice a year. Routine eye exams. Ask your health care provider how often you should have your eyes checked. Personal lifestyle choices, including: Daily care of your teeth and gums. Regular physical activity. Eating a healthy diet. Avoiding tobacco and drug use. Limiting alcohol use. Practicing safe sex. Taking low-dose aspirin every day. Taking vitamin and mineral supplements as recommended by your health care provider. What happens during an annual well check? The services and screenings done by your health care provider during your annual  well check will depend on your age, overall health, lifestyle risk factors, and family history of disease. Counseling  Your health care provider may ask you questions about your: Alcohol use. Tobacco use. Drug use. Emotional well-being. Home and relationship well-being. Sexual activity. Eating habits. History of falls. Memory and ability to understand (cognition). Work and work Statistician. Reproductive health. Screening  You may have the following tests or measurements: Height, weight, and BMI. Blood pressure. Lipid and cholesterol levels. These may be checked every 5 years, or more frequently if you are over 60 years old. Skin check. Lung cancer screening. You may have this screening every year starting at age 75 if you have a 30-pack-year history of smoking and currently smoke or have quit within the past 15 years. Fecal occult blood test (FOBT) of the stool. You may have this test every year starting at age 15. Flexible sigmoidoscopy or colonoscopy. You may have a sigmoidoscopy every 5 years or a colonoscopy every 10 years starting at age 74. Hepatitis C blood test. Hepatitis B blood test. Sexually transmitted disease (STD) testing. Diabetes screening. This is done by checking your blood sugar (glucose) after you have not eaten for a while (fasting). You may have this done every 1-3 years. Bone density scan. This is done to screen for osteoporosis. You may have this done starting at age 73. Mammogram. This may be done every 1-2 years. Talk to your health care provider about how often you should have regular mammograms. Talk with your health care provider about your test results, treatment options, and if necessary, the need for more tests. Vaccines  Your health care provider may recommend certain vaccines, such as: Influenza vaccine. This is recommended every year. Tetanus, diphtheria, and acellular pertussis (Tdap, Td) vaccine. You may  need a Td booster every 10 years. Zoster  vaccine. You may need this after age 60. Pneumococcal 13-valent conjugate (PCV13) vaccine. One dose is recommended after age 46. Pneumococcal polysaccharide (PPSV23) vaccine. One dose is recommended after age 30. Talk to your health care provider about which screenings and vaccines you need and how often you need them. This information is not intended to replace advice given to you by your health care provider. Make sure you discuss any questions you have with your health care provider. Document Released: 10/06/2015 Document Revised: 05/29/2016 Document Reviewed: 07/11/2015 Elsevier Interactive Patient Education  2017 [place] Prevention in the Home Falls can cause injuries. They can happen to people of all ages. There are many things you can do to make your home safe and to help prevent falls. What can I do on the outside of my home? Regularly fix the edges of walkways and driveways and fix any cracks. Remove anything that might make you trip as you walk through a door, such as a raised step or threshold. Trim any bushes or trees on the path to your home. Use bright outdoor lighting. Clear any walking paths of anything that might make someone trip, such as rocks or tools. Regularly check to see if handrails are loose or broken. Make sure that both sides of any steps have handrails. Any raised decks and porches should have guardrails on the edges. Have any leaves, snow, or ice cleared regularly. Use sand or salt on walking paths during winter. Clean up any spills in your garage right away. This includes oil or grease spills. What can I do in the bathroom? Use night lights. Install grab bars by the toilet and in the tub and shower. Do not use towel bars as grab bars. Use non-skid mats or decals in the tub or shower. If you need to sit down in the shower, use a plastic, non-slip stool. Keep the floor dry. Clean up any water that spills on the floor as soon as it happens. Remove  soap buildup in the tub or shower regularly. Attach bath mats securely with double-sided non-slip rug tape. Do not have throw rugs and other things on the floor that can make you trip. What can I do in the bedroom? Use night lights. Make sure that you have a light by your bed that is easy to reach. Do not use any sheets or blankets that are too big for your bed. They should not hang down onto the floor. Have a firm chair that has side arms. You can use this for support while you get dressed. Do not have throw rugs and other things on the floor that can make you trip. What can I do in the kitchen? Clean up any spills right away. Avoid walking on wet floors. Keep items that you use a lot in easy-to-reach places. If you need to reach something above you, use a strong step stool that has a grab bar. Keep electrical cords out of the way. Do not use floor polish or wax that makes floors slippery. If you must use wax, use non-skid floor wax. Do not have throw rugs and other things on the floor that can make you trip. What can I do with my stairs? Do not leave any items on the stairs. Make sure that there are handrails on both sides of the stairs and use them. Fix handrails that are broken or loose. Make sure that handrails are as long as the stairways.  Check any carpeting to make sure that it is firmly attached to the stairs. Fix any carpet that is loose or worn. Avoid having throw rugs at the top or bottom of the stairs. If you do have throw rugs, attach them to the floor with carpet tape. Make sure that you have a light switch at the top of the stairs and the bottom of the stairs. If you do not have them, ask someone to add them for you. What else can I do to help prevent falls? Wear shoes that: Do not have high heels. Have rubber bottoms. Are comfortable and fit you well. Are closed at the toe. Do not wear sandals. If you use a stepladder: Make sure that it is fully opened. Do not climb a  closed stepladder. Make sure that both sides of the stepladder are locked into place. Ask someone to hold it for you, if possible. Clearly mark and make sure that you can see: Any grab bars or handrails. First and last steps. Where the edge of each step is. Use tools that help you move around (mobility aids) if they are needed. These include: Canes. Walkers. Scooters. Crutches. Turn on the lights when you go into a dark area. Replace any light bulbs as soon as they burn out. Set up your furniture so you have a clear path. Avoid moving your furniture around. If any of your floors are uneven, fix them. If there are any pets around you, be aware of where they are. Review your medicines with your doctor. Some medicines can make you feel dizzy. This can increase your chance of falling. Ask your doctor what other things that you can do to help prevent falls. This information is not intended to replace advice given to you by your health care provider. Make sure you discuss any questions you have with your health care provider. Document Released: 07/06/2009 Document Revised: 02/15/2016 Document Reviewed: 10/14/2014 Elsevier Interactive Patient Education  2017 [employer].

## 2021-09-13 ENCOUNTER — Telehealth (HOSPITAL_COMMUNITY): Payer: Self-pay

## 2021-09-13 NOTE — Telephone Encounter (Signed)
This is Dr. Marguerite Olea patient. She stated that patient is experiencing extreme anxiety and panic attacks that have been going on for a while. Patient stated that it got worse when she got a cold in November. Patient is on oxygen 24 hrs/day. Patient stated that she can't get up to go to kitchen for something to eat, she's huffing and puffing, she's hypervenilating, gasping, and gagging a lot. Please review and advise. Thank you

## 2021-09-13 NOTE — Telephone Encounter (Signed)
This needs to be addressed by PCP or pulmonary first. Please tell her to call them

## 2021-09-18 ENCOUNTER — Telehealth (HOSPITAL_COMMUNITY): Payer: Self-pay | Admitting: *Deleted

## 2021-09-18 MED ORDER — HYDROXYZINE HCL 10 MG PO TABS: 10.0000 mg | ORAL_TABLET | Freq: Two times a day (BID) | ORAL | 0 refills | Status: DC | PRN

## 2021-09-18 NOTE — Telephone Encounter (Signed)
Writer returned pt call regarding increased anxiety. Writer informed pt that covering provider advised that pt see her PCP or Pulmonologist first. Pt repeatedly and firmly stated that she is on O2 continuously and that she's experiencing symptoms of anxiety not r/t pulmonary issues. Pt is asking for possibly Xanax qd prn as she has tried this medication before and it worked. Looks like she took 0.25.mg bid prn in 2017. Pt asking for enough to bridge to appointment on 10/16/21. Please review and advise. Thanks!

## 2021-09-18 NOTE — Telephone Encounter (Signed)
Atarax 10 mg up to BID for anxiety send to [facility].

## 2021-09-19 ENCOUNTER — Emergency Department (HOSPITAL_BASED_OUTPATIENT_CLINIC_OR_DEPARTMENT_OTHER): Payer: Medicare Other

## 2021-09-19 ENCOUNTER — Other Ambulatory Visit: Payer: Self-pay

## 2021-09-19 ENCOUNTER — Encounter (HOSPITAL_BASED_OUTPATIENT_CLINIC_OR_DEPARTMENT_OTHER): Payer: Self-pay

## 2021-09-19 ENCOUNTER — Inpatient Hospital Stay (HOSPITAL_BASED_OUTPATIENT_CLINIC_OR_DEPARTMENT_OTHER)
Admission: EM | Admit: 2021-09-19 | Discharge: 2021-10-04 | DRG: 291 | Disposition: A | Discharge status: Skilled Nursing Facility | Payer: Medicare Other | Attending: Internal Medicine | Admitting: Internal Medicine

## 2021-09-19 DIAGNOSIS — F32A Depression, unspecified: Secondary | ICD-10-CM | POA: Diagnosis present

## 2021-09-19 DIAGNOSIS — I10 Essential (primary) hypertension: Secondary | ICD-10-CM | POA: Diagnosis present

## 2021-09-19 DIAGNOSIS — R0602 Shortness of breath: Secondary | ICD-10-CM | POA: Diagnosis present

## 2021-09-19 DIAGNOSIS — Z79899 Other long term (current) drug therapy: Secondary | ICD-10-CM

## 2021-09-19 DIAGNOSIS — N179 Acute kidney failure, unspecified: Secondary | ICD-10-CM | POA: Diagnosis present

## 2021-09-19 DIAGNOSIS — Z20822 Contact with and (suspected) exposure to covid-19: Secondary | ICD-10-CM | POA: Diagnosis not present

## 2021-09-19 DIAGNOSIS — E874 Mixed disorder of acid-base balance: Secondary | ICD-10-CM | POA: Diagnosis present

## 2021-09-19 DIAGNOSIS — E7220 Disorder of urea cycle metabolism, unspecified: Secondary | ICD-10-CM | POA: Diagnosis not present

## 2021-09-19 DIAGNOSIS — I442 Atrioventricular block, complete: Secondary | ICD-10-CM | POA: Diagnosis present

## 2021-09-19 DIAGNOSIS — Z885 Allergy status to narcotic agent status: Secondary | ICD-10-CM

## 2021-09-19 DIAGNOSIS — T380X5A Adverse effect of glucocorticoids and synthetic analogues, initial encounter: Secondary | ICD-10-CM

## 2021-09-19 DIAGNOSIS — Z66 Do not resuscitate: Secondary | ICD-10-CM | POA: Diagnosis not present

## 2021-09-19 DIAGNOSIS — Z818 Family history of other mental and behavioral disorders: Secondary | ICD-10-CM

## 2021-09-19 DIAGNOSIS — Z833 Family history of diabetes mellitus: Secondary | ICD-10-CM

## 2021-09-19 DIAGNOSIS — I959 Hypotension, unspecified: Secondary | ICD-10-CM | POA: Diagnosis not present

## 2021-09-19 DIAGNOSIS — N1832 Chronic kidney disease, stage 3b: Secondary | ICD-10-CM | POA: Diagnosis not present

## 2021-09-19 DIAGNOSIS — E039 Hypothyroidism, unspecified: Secondary | ICD-10-CM | POA: Diagnosis not present

## 2021-09-19 DIAGNOSIS — E876 Hypokalemia: Secondary | ICD-10-CM | POA: Diagnosis not present

## 2021-09-19 DIAGNOSIS — D696 Thrombocytopenia, unspecified: Secondary | ICD-10-CM | POA: Diagnosis present

## 2021-09-19 DIAGNOSIS — I5021 Acute systolic (congestive) heart failure: Secondary | ICD-10-CM | POA: Diagnosis present

## 2021-09-19 DIAGNOSIS — I130 Hypertensive heart and chronic kidney disease with heart failure and stage 1 through stage 4 chronic kidney disease, or unspecified chronic kidney disease: Principal | ICD-10-CM | POA: Diagnosis present

## 2021-09-19 DIAGNOSIS — G40909 Epilepsy, unspecified, not intractable, without status epilepticus: Secondary | ICD-10-CM | POA: Diagnosis not present

## 2021-09-19 DIAGNOSIS — J9622 Acute and chronic respiratory failure with hypercapnia: Secondary | ICD-10-CM | POA: Diagnosis present

## 2021-09-19 DIAGNOSIS — F319 Bipolar disorder, unspecified: Secondary | ICD-10-CM | POA: Diagnosis present

## 2021-09-19 DIAGNOSIS — Z9981 Dependence on supplemental oxygen: Secondary | ICD-10-CM | POA: Diagnosis not present

## 2021-09-19 DIAGNOSIS — Z515 Encounter for palliative care: Secondary | ICD-10-CM

## 2021-09-19 DIAGNOSIS — R197 Diarrhea, unspecified: Secondary | ICD-10-CM | POA: Diagnosis not present

## 2021-09-19 DIAGNOSIS — Z888 Allergy status to other drugs, medicaments and biological substances status: Secondary | ICD-10-CM

## 2021-09-19 DIAGNOSIS — E785 Hyperlipidemia, unspecified: Secondary | ICD-10-CM | POA: Diagnosis present

## 2021-09-19 DIAGNOSIS — R079 Chest pain, unspecified: Secondary | ICD-10-CM

## 2021-09-19 DIAGNOSIS — R739 Hyperglycemia, unspecified: Secondary | ICD-10-CM | POA: Diagnosis present

## 2021-09-19 DIAGNOSIS — Z5329 Procedure and treatment not carried out because of patient's decision for other reasons: Secondary | ICD-10-CM | POA: Diagnosis not present

## 2021-09-19 DIAGNOSIS — Z79890 Hormone replacement therapy (postmenopausal): Secondary | ICD-10-CM

## 2021-09-19 DIAGNOSIS — J440 Chronic obstructive pulmonary disease with acute lower respiratory infection: Secondary | ICD-10-CM | POA: Diagnosis present

## 2021-09-19 DIAGNOSIS — J9621 Acute and chronic respiratory failure with hypoxia: Secondary | ICD-10-CM | POA: Diagnosis not present

## 2021-09-19 DIAGNOSIS — J441 Chronic obstructive pulmonary disease with (acute) exacerbation: Secondary | ICD-10-CM | POA: Diagnosis present

## 2021-09-19 DIAGNOSIS — F17210 Nicotine dependence, cigarettes, uncomplicated: Secondary | ICD-10-CM | POA: Diagnosis present

## 2021-09-19 DIAGNOSIS — J181 Lobar pneumonia, unspecified organism: Secondary | ICD-10-CM | POA: Diagnosis present

## 2021-09-19 DIAGNOSIS — R748 Abnormal levels of other serum enzymes: Secondary | ICD-10-CM | POA: Diagnosis present

## 2021-09-19 DIAGNOSIS — I2721 Secondary pulmonary arterial hypertension: Secondary | ICD-10-CM | POA: Diagnosis not present

## 2021-09-19 DIAGNOSIS — F419 Anxiety disorder, unspecified: Secondary | ICD-10-CM | POA: Diagnosis present

## 2021-09-19 DIAGNOSIS — Z85828 Personal history of other malignant neoplasm of skin: Secondary | ICD-10-CM

## 2021-09-19 DIAGNOSIS — G9341 Metabolic encephalopathy: Secondary | ICD-10-CM | POA: Diagnosis not present

## 2021-09-19 DIAGNOSIS — J189 Pneumonia, unspecified organism: Secondary | ICD-10-CM | POA: Diagnosis present

## 2021-09-19 DIAGNOSIS — R5381 Other malaise: Secondary | ICD-10-CM | POA: Diagnosis present

## 2021-09-19 DIAGNOSIS — Z950 Presence of cardiac pacemaker: Secondary | ICD-10-CM

## 2021-09-19 DIAGNOSIS — F411 Generalized anxiety disorder: Secondary | ICD-10-CM | POA: Diagnosis present

## 2021-09-19 DIAGNOSIS — Z8249 Family history of ischemic heart disease and other diseases of the circulatory system: Secondary | ICD-10-CM

## 2021-09-19 DIAGNOSIS — R160 Hepatomegaly, not elsewhere classified: Secondary | ICD-10-CM | POA: Diagnosis present

## 2021-09-19 DIAGNOSIS — Z809 Family history of malignant neoplasm, unspecified: Secondary | ICD-10-CM

## 2021-09-19 DIAGNOSIS — K219 Gastro-esophageal reflux disease without esophagitis: Secondary | ICD-10-CM | POA: Diagnosis present

## 2021-09-19 LAB — CBC WITH DIFFERENTIAL/PLATELET
Abs Immature Granulocytes: 0.02 10*3/uL (ref 0.00–0.07)
Basophils Absolute: 0 10*3/uL (ref 0.0–0.1)
Basophils Relative: 0 %
Eosinophils Absolute: 0 10*3/uL (ref 0.0–0.5)
Eosinophils Relative: 0 %
HCT: 39.2 % (ref 36.0–46.0)
Hemoglobin: 12.3 g/dL (ref 12.0–15.0)
Immature Granulocytes: 0 %
Lymphocytes Relative: 23 %
Lymphs Abs: 1 10*3/uL (ref 0.7–4.0)
MCH: 29.1 pg (ref 26.0–34.0)
MCHC: 31.4 g/dL (ref 30.0–36.0)
MCV: 92.7 fL (ref 80.0–100.0)
Monocytes Absolute: 0.3 10*3/uL (ref 0.1–1.0)
Monocytes Relative: 7 %
Neutro Abs: 3.1 10*3/uL (ref 1.7–7.7)
Neutrophils Relative %: 70 %
Platelets: 122 10*3/uL — ABNORMAL LOW (ref 150–400)
RBC: 4.23 MIL/uL (ref 3.87–5.11)
RDW: 17.6 % — ABNORMAL HIGH (ref 11.5–15.5)
WBC: 4.5 10*3/uL (ref 4.0–10.5)
nRBC: 0 % (ref 0.0–0.2)

## 2021-09-19 LAB — BASIC METABOLIC PANEL
Anion gap: 9 (ref 5–15)
BUN: 32 mg/dL — ABNORMAL HIGH (ref 8–23)
CO2: 21 mmol/L — ABNORMAL LOW (ref 22–32)
Calcium: 8.1 mg/dL — ABNORMAL LOW (ref 8.9–10.3)
Chloride: 109 mmol/L (ref 98–111)
Creatinine, Ser: 2.27 mg/dL — ABNORMAL HIGH (ref 0.44–1.00)
GFR, Estimated: 23 mL/min — ABNORMAL LOW (ref >60)
Glucose, Bld: 176 mg/dL — ABNORMAL HIGH (ref 70–99)
Potassium: 4.3 mmol/L (ref 3.5–5.1)
Sodium: 139 mmol/L (ref 135–145)

## 2021-09-19 LAB — PROCALCITONIN: Procalcitonin: 0.11 ng/mL

## 2021-09-19 LAB — GLUCOSE, CAPILLARY: Glucose-Capillary: 170 mg/dL — ABNORMAL HIGH (ref 70–99)

## 2021-09-19 LAB — RESP PANEL BY RT-PCR (FLU A&B, COVID) ARPGX2
Influenza A by PCR: NEGATIVE
Influenza B by PCR: NEGATIVE
SARS Coronavirus 2 by RT PCR: NEGATIVE

## 2021-09-19 LAB — BRAIN NATRIURETIC PEPTIDE: B Natriuretic Peptide: 1577 pg/mL — ABNORMAL HIGH (ref 0.0–100.0)

## 2021-09-19 LAB — TSH: TSH: 2.249 u[IU]/mL (ref 0.350–4.500)

## 2021-09-19 MED ORDER — ACETAMINOPHEN 650 MG RE SUPP: 650.0000 mg | Freq: Four times a day (QID) | RECTAL | Status: DC | PRN

## 2021-09-19 MED ORDER — IPRATROPIUM BROMIDE 0.02 % IN SOLN
0.5000 mg | Freq: Three times a day (TID) | RESPIRATORY_TRACT | Status: DC
  Administered 2021-09-20 – 2021-09-25 (×17): 0.5 mg via RESPIRATORY_TRACT
  Filled 2021-09-19 (×19): qty 2.5

## 2021-09-19 MED ORDER — ACETAMINOPHEN 325 MG PO TABS: 650.0000 mg | ORAL_TABLET | Freq: Four times a day (QID) | ORAL | Status: DC | PRN

## 2021-09-19 MED ORDER — INSULIN ASPART 100 UNIT/ML IJ SOLN
0.0000 [IU] | Freq: Three times a day (TID) | INTRAMUSCULAR | Status: DC
  Administered 2021-09-20: 17:00:00 7 [IU] via SUBCUTANEOUS
  Administered 2021-09-20 – 2021-09-21 (×3): 2 [IU] via SUBCUTANEOUS
  Administered 2021-09-21: 17:00:00 3 [IU] via SUBCUTANEOUS
  Administered 2021-09-22 (×2): 1 [IU] via SUBCUTANEOUS
  Administered 2021-09-23: 3 [IU] via SUBCUTANEOUS
  Administered 2021-09-23 (×2): 1 [IU] via SUBCUTANEOUS
  Administered 2021-09-24 – 2021-09-25 (×5): 2 [IU] via SUBCUTANEOUS
  Administered 2021-09-25: 3 [IU] via SUBCUTANEOUS
  Administered 2021-09-26 – 2021-09-27 (×6): 2 [IU] via SUBCUTANEOUS
  Administered 2021-09-28: 1 [IU] via SUBCUTANEOUS
  Administered 2021-09-28: 2 [IU] via SUBCUTANEOUS
  Administered 2021-09-28: 1 [IU] via SUBCUTANEOUS
  Administered 2021-09-29: 2 [IU] via SUBCUTANEOUS
  Administered 2021-09-29: 3 [IU] via SUBCUTANEOUS
  Administered 2021-09-30: 5 [IU] via SUBCUTANEOUS
  Administered 2021-09-30: 1 [IU] via SUBCUTANEOUS
  Administered 2021-09-30: 2 [IU] via SUBCUTANEOUS
  Administered 2021-10-01: 5 [IU] via SUBCUTANEOUS
  Administered 2021-10-01: 1 [IU] via SUBCUTANEOUS
  Administered 2021-10-01: 2 [IU] via SUBCUTANEOUS
  Administered 2021-10-02: 1 [IU] via SUBCUTANEOUS
  Administered 2021-10-02 – 2021-10-03 (×2): 2 [IU] via SUBCUTANEOUS
  Administered 2021-10-03: 1 [IU] via SUBCUTANEOUS
  Administered 2021-10-03 – 2021-10-04 (×2): 2 [IU] via SUBCUTANEOUS

## 2021-09-19 MED ORDER — LEVALBUTEROL HCL 1.25 MG/0.5ML IN NEBU
1.2500 mg | INHALATION_SOLUTION | RESPIRATORY_TRACT | Status: DC | PRN
  Filled 2021-09-19: qty 0.5

## 2021-09-19 MED ORDER — LORAZEPAM 2 MG/ML IJ SOLN
0.5000 mg | INTRAMUSCULAR | Status: DC | PRN
  Administered 2021-09-20: 15:00:00 0.5 mg via INTRAVENOUS
  Filled 2021-09-19: qty 1

## 2021-09-19 MED ORDER — METHYLPREDNISOLONE SODIUM SUCC 125 MG IJ SOLR
125.0000 mg | Freq: Once | INTRAMUSCULAR | Status: AC
  Administered 2021-09-19: 15:00:00 125 mg via INTRAVENOUS
  Filled 2021-09-19: qty 2

## 2021-09-19 MED ORDER — LORAZEPAM 1 MG PO TABS
1.0000 mg | ORAL_TABLET | Freq: Once | ORAL | Status: AC
  Administered 2021-09-19: 12:00:00 1 mg via ORAL
  Filled 2021-09-19: qty 1

## 2021-09-19 MED ORDER — FLUOXETINE HCL 20 MG PO CAPS
40.0000 mg | ORAL_CAPSULE | Freq: Every day | ORAL | Status: DC
  Administered 2021-09-20 – 2021-10-04 (×13): 40 mg via ORAL
  Filled 2021-09-19 (×14): qty 2

## 2021-09-19 MED ORDER — SODIUM CHLORIDE 0.9 % IV SOLN
1.0000 g | INTRAVENOUS | Status: DC
  Administered 2021-09-20 – 2021-09-24 (×5): 1 g via INTRAVENOUS
  Filled 2021-09-19 (×5): qty 10

## 2021-09-19 MED ORDER — SODIUM CHLORIDE 0.9 % IV SOLN
500.0000 mg | Freq: Once | INTRAVENOUS | Status: AC
  Administered 2021-09-19: 16:00:00 500 mg via INTRAVENOUS
  Filled 2021-09-19: qty 5

## 2021-09-19 MED ORDER — LORAZEPAM 1 MG PO TABS
1.0000 mg | ORAL_TABLET | Freq: Once | ORAL | Status: AC
  Administered 2021-09-19: 16:00:00 1 mg via ORAL
  Filled 2021-09-19: qty 1

## 2021-09-19 MED ORDER — PANTOPRAZOLE SODIUM 40 MG PO TBEC
40.0000 mg | DELAYED_RELEASE_TABLET | Freq: Every day | ORAL | Status: DC
  Administered 2021-09-20 – 2021-10-04 (×13): 40 mg via ORAL
  Filled 2021-09-19 (×14): qty 1

## 2021-09-19 MED ORDER — IPRATROPIUM BROMIDE 0.02 % IN SOLN
0.5000 mg | Freq: Four times a day (QID) | RESPIRATORY_TRACT | Status: DC
  Administered 2021-09-19: 21:00:00 0.5 mg via RESPIRATORY_TRACT
  Filled 2021-09-19: qty 2.5

## 2021-09-19 MED ORDER — SODIUM CHLORIDE 0.9 % IV SOLN: INTRAVENOUS | Status: DC | PRN

## 2021-09-19 MED ORDER — CARBAMAZEPINE ER 100 MG PO TB12
100.0000 mg | ORAL_TABLET | Freq: Two times a day (BID) | ORAL | Status: DC
  Administered 2021-09-19 – 2021-10-04 (×28): 100 mg via ORAL
  Filled 2021-09-19 (×30): qty 1

## 2021-09-19 MED ORDER — ATORVASTATIN CALCIUM 40 MG PO TABS
40.0000 mg | ORAL_TABLET | Freq: Every day | ORAL | Status: DC
  Administered 2021-09-20 – 2021-09-21 (×2): 40 mg via ORAL
  Filled 2021-09-19 (×2): qty 1

## 2021-09-19 MED ORDER — LEVOTHYROXINE SODIUM 25 MCG PO TABS
125.0000 ug | ORAL_TABLET | Freq: Every day | ORAL | Status: DC
  Administered 2021-09-20 – 2021-10-04 (×15): 125 ug via ORAL
  Filled 2021-09-19 (×15): qty 1

## 2021-09-19 MED ORDER — SODIUM CHLORIDE 0.9 % IV SOLN
500.0000 mg | INTRAVENOUS | Status: AC
  Administered 2021-09-20 – 2021-09-23 (×4): 500 mg via INTRAVENOUS
  Filled 2021-09-19 (×4): qty 5

## 2021-09-19 MED ORDER — ALBUTEROL SULFATE (2.5 MG/3ML) 0.083% IN NEBU
5.0000 mg | INHALATION_SOLUTION | Freq: Once | RESPIRATORY_TRACT | Status: AC
  Administered 2021-09-19: 12:00:00 5 mg via RESPIRATORY_TRACT
  Filled 2021-09-19: qty 6

## 2021-09-19 MED ORDER — METHYLPREDNISOLONE SODIUM SUCC 125 MG IJ SOLR
80.0000 mg | Freq: Two times a day (BID) | INTRAMUSCULAR | Status: DC
  Administered 2021-09-20 – 2021-09-21 (×3): 80 mg via INTRAVENOUS
  Filled 2021-09-19 (×3): qty 2

## 2021-09-19 MED ORDER — FUROSEMIDE 10 MG/ML IJ SOLN
40.0000 mg | Freq: Once | INTRAMUSCULAR | Status: AC
  Administered 2021-09-19: 15:00:00 40 mg via INTRAVENOUS
  Filled 2021-09-19: qty 4

## 2021-09-19 MED ORDER — QUETIAPINE FUMARATE 100 MG PO TABS
300.0000 mg | ORAL_TABLET | Freq: Every day | ORAL | Status: DC
  Administered 2021-09-19 – 2021-09-21 (×3): 300 mg via ORAL
  Filled 2021-09-19 (×3): qty 3

## 2021-09-19 MED ORDER — SODIUM CHLORIDE 0.9 % IV SOLN
1.0000 g | Freq: Once | INTRAVENOUS | Status: AC
  Administered 2021-09-19: 16:00:00 1 g via INTRAVENOUS
  Filled 2021-09-19: qty 10

## 2021-09-19 MED ORDER — LEVALBUTEROL HCL 1.25 MG/0.5ML IN NEBU
1.2500 mg | INHALATION_SOLUTION | Freq: Four times a day (QID) | RESPIRATORY_TRACT | Status: DC
  Administered 2021-09-19: 21:00:00 1.25 mg via RESPIRATORY_TRACT
  Filled 2021-09-19: qty 0.5

## 2021-09-19 MED ORDER — LEVALBUTEROL HCL 1.25 MG/0.5ML IN NEBU
1.2500 mg | INHALATION_SOLUTION | Freq: Three times a day (TID) | RESPIRATORY_TRACT | Status: DC
  Administered 2021-09-20 – 2021-09-25 (×17): 1.25 mg via RESPIRATORY_TRACT
  Filled 2021-09-19 (×19): qty 0.5

## 2021-09-19 MED ORDER — BENZONATATE 100 MG PO CAPS
200.0000 mg | ORAL_CAPSULE | Freq: Three times a day (TID) | ORAL | Status: DC | PRN
  Administered 2021-09-23 – 2021-09-28 (×5): 200 mg via ORAL
  Filled 2021-09-19 (×5): qty 2

## 2021-09-19 NOTE — ED Triage Notes (Signed)
Pt arrives from home with family states that she is here for anxiety today but is having SOB upon arrival. Pt is supposed to be on 2L [place] but did not bring it with her to the ED. Upon arrival pt room air O2 was 81%. RT at bedside placing patient on 2.5L [place]. 91% once placed on O2. Pt reports she has been sick since November, has been using musinex and Flonase at home.

## 2021-09-19 NOTE — ED Notes (Signed)
Pt transported to xray 

## 2021-09-19 NOTE — ED Notes (Signed)
[facility] to [facility]

## 2021-09-19 NOTE — ED Provider Notes (Signed)
[facility] EMERGENCY DEPARTMENT Provider Note   CSN: [id number] Arrival date & time: 09/19/21  1028     History Chief Complaint  Patient presents with   Shortness of Breath    Kathryn Buckley is a 70 y.o. female.  Patient is a 70 year old female with a history of COPD on oxygen at 2 L/min chronically, hypertension hyperlipidemia who presents with anxiety.  She said she had a remote history of anxiety when she was in her 55s and she said it was very bad then where she was taking Xanax frequently.  She said recently she has had a recurrence of her anxiety over the last month.  It started after she had a bad cold in November.  She said she has not quite gotten over it.  She still has some coughing and congestion and increased fatigue.  She reports some shortness of breath at times.  She is hesitant to use her albuterol inhaler at home because it makes her jittery.  She has not been using it recently.  She denies any chest pain.  No recent fevers.  She tried to get in touch with her psychiatrist to call in a prescription for Xanax.  It looks like from the notes that they have sent a prescription for Atarax.      Past Medical History:  Diagnosis Date   COPD (chronic obstructive pulmonary disease) ([place])    Depression    HTN (hypertension)    Hyperlipemia    Seizures ([facility])    Due to brain tumor that was removed in 2004   Thyroid disease     Patient Active Problem List   Diagnosis Date Noted   Positive colorectal cancer screening using Cologuard test 11/22/2020   Renal insufficiency 10/07/2020   Hyperglycemia 04/30/2017   Seizure disorder ([place]) 04/01/2016   History of third degree heart block 04/01/2016   Hyperlipidemia 12/14/2014   Bipolar 1 disorder, mixed, moderate ([place]) 08/25/2014   Chronic respiratory failure with hypoxia ([place]) 08/25/2014   COPD (chronic obstructive pulmonary disease) ([place]) 07/03/2014   Basal cell carcinoma of cheek 06/04/2013   Routine  general medical examination at a health care facility 10/15/2012   Abnormal EKG 10/15/2012   Hypoxia 10/15/2012   HTN (hypertension) 09/06/2012   Hypothyroid 09/06/2012   GERD (gastroesophageal reflux disease) 09/06/2012   Tobacco abuse 09/06/2012   Depression 11/18/2011    Past Surgical History:  Procedure Laterality Date   BRAIN SURGERY     age 64, she reports a brain tumor removal.       OB History   No obstetric history on file.     Family History  Problem Relation Age of Onset   Bipolar disorder Father    Heart disease Father    Cancer Father        prostate   Heart disease Mother    Hypertension Brother    Diabetes Brother    Diabetes Mellitus II Sister    Stroke Neg Hx    Hyperlipidemia Neg Hx     Social History   Tobacco Use   Smoking status: Every Day    Packs/day: 2.00    Years: 40.00    Pack years: 80.00    Types: Cigarettes    Last attempt to quit: 03/14/2016    Years since quitting: 5.5   Smokeless tobacco: Never  Vaping Use   Vaping Use: Never used  Substance Use Topics   Alcohol use: Yes    Alcohol/week: 2.0 - 3.0  standard drinks    Types: 2 - 3 Cans of beer per week    Comment: Occasional beer   Drug use: No    Home Medications Prior to Admission medications   Medication Sig Start Date End Date Taking? Authorizing Provider  Acetaminophen (TYLENOL 8 HOUR PO) Take by mouth.    [provider]  albuterol (VENTOLIN HFA) 108 (90 Base) MCG/ACT inhaler Inhale 2 puffs into the lungs every 6 (six) hours as needed for wheezing or shortness of breath. 10/06/20   Debbrah Alar, NP  amLODipine (NORVASC) 5 MG tablet Take 1 tablet (5 mg total) by mouth daily. 07/24/21   Debbrah Alar, NP  atorvastatin (LIPITOR) 40 MG tablet TAKE 1 TABLET BY MOUTH EVERY DAY 07/24/21   Debbrah Alar, NP  carbamazepine (TEGRETOL XR) 100 MG 12 hr tablet Take 1 tablet (100 mg total) by mouth 2 (two) times daily. 07/18/21 07/18/22  Arfeen, Arlyce Harman, MD   FLUoxetine (PROZAC) 40 MG capsule Take 1 capsule (40 mg total) by mouth daily. 07/18/21 07/18/22  Arfeen, Arlyce Harman, MD  hydrOXYzine (ATARAX) 10 MG tablet Take 1 tablet (10 mg total) by mouth 2 (two) times daily as needed for anxiety. 09/18/21   Arfeen, Arlyce Harman, MD  levothyroxine (SYNTHROID) 125 MCG tablet TAKE 1 TABLET(125 MCG) BY MOUTH DAILY BEFORE BREAKFAST 04/26/21   Debbrah Alar, NP  loratadine (CLARITIN) 10 MG tablet Take 10 mg by mouth daily. Patient not taking: Reported on 09/04/2021    [provider]  omeprazole (PRILOSEC) 40 MG capsule TAKE 1 CAPSULE(40 MG) BY MOUTH DAILY 03/29/21   Debbrah Alar, NP  QUEtiapine (SEROQUEL) 300 MG tablet TAKE 1 TABLET BY MOUTH AT BEDTIME 07/18/21   Arfeen, Arlyce Harman, MD  TRELEGY ELLIPTA 100-62.5-25 MCG/INH AEPB Take 1 puff by mouth daily. 10/06/20   Debbrah Alar, NP  varenicline (CHANTIX) 0.5 MG tablet Days 1 to 3: 0.5 mg (one tab) once daily. Days 4 to 7: 0.5 mg (one tab) twice daily. Maintenance (day 8 and later): 2 tabs by mouth twice daily Patient not taking: Reported on 09/04/2021 07/18/21   Debbrah Alar, NP  Zoster Vaccine Adjuvanted [facility]) injection Inject 0.5mg  IM now and again in 2-6 months. 02/20/21   Debbrah Alar, NP    Allergies    Lamictal [lamotrigine] and Codeine  Review of Systems   Review of Systems  Constitutional:  Positive for fatigue. Negative for chills, diaphoresis and fever.  HENT:  Positive for congestion. Negative for rhinorrhea and sneezing.   Eyes: Negative.   Respiratory:  Positive for cough and shortness of breath. Negative for chest tightness.   Cardiovascular:  Negative for chest pain and leg swelling.  Gastrointestinal:  Negative for abdominal pain, blood in stool, diarrhea, nausea and vomiting.  Genitourinary:  Negative for difficulty urinating, flank pain, frequency and hematuria.  Musculoskeletal:  Negative for arthralgias and back pain.  Skin:  Negative for rash.   Neurological:  Negative for dizziness, speech difficulty, weakness, numbness and headaches.  Psychiatric/Behavioral:  The patient is nervous/anxious.    Physical Exam Updated Vital Signs BP (!) 137/103    Pulse (!) 111    Temp 98.3 F (36.8 C) (Oral)    Resp 15    Ht 5\' 9"  (1.753 m)    Wt 71.7 kg    LMP 09/23/1988    SpO2 96%    BMI 23.33 kg/m   Physical Exam Constitutional:      Appearance: She is well-developed.  HENT:  Head: Normocephalic and atraumatic.  Eyes:     Pupils: Pupils are equal, round, and reactive to light.  Cardiovascular:     Rate and Rhythm: Normal rate and regular rhythm.     Heart sounds: Normal heart sounds.  Pulmonary:     Effort: Pulmonary effort is normal. No respiratory distress.     Breath sounds: Decreased breath sounds present. No wheezing or rales.  Chest:     Chest wall: No tenderness.  Abdominal:     General: Bowel sounds are normal.     Palpations: Abdomen is soft.     Tenderness: There is no abdominal tenderness. There is no guarding or rebound.  Musculoskeletal:        General: Normal range of motion.     Cervical back: Normal range of motion and neck supple.     Comments: No edema or calf tenderness  Lymphadenopathy:     Cervical: No cervical adenopathy.  Skin:    General: Skin is warm and dry.     Findings: No rash.  Neurological:     Mental Status: She is alert and oriented to person, place, and time.    ED Results / Procedures / Treatments   Labs (all labs ordered are listed, but only abnormal results are displayed) Labs Reviewed  CBC WITH DIFFERENTIAL/PLATELET - Abnormal; Notable for the following components:      Result Value   RDW 17.6 (*)    Platelets 122 (*)    All other components within normal limits  BASIC METABOLIC PANEL - Abnormal; Notable for the following components:   CO2 21 (*)    Glucose, Bld 176 (*)    BUN 32 (*)    Creatinine, Ser 2.27 (*)    Calcium 8.1 (*)    GFR, Estimated 23 (*)    All other  components within normal limits  RESP PANEL BY RT-PCR (FLU A&B, COVID) [id number]    EKG EKG Interpretation  Date/Time:  Wednesday September 19 2021 10:44:01 EST Ventricular Rate:  111 PR Interval:    QRS Duration: 170 QT Interval:  419 QTC Calculation: 570 R Axis:   -62 Text Interpretation: appears to be a paced rhythm Confirmed by Malvin Johns [phone]) on 09/19/2021 11:58:46 AM  Radiology DG Chest 2 View  Result Date: 09/19/2021 CLINICAL DATA:  Cough EXAM: CHEST - 2 VIEW COMPARISON:  Earlier today FINDINGS: Left chest wall pacer device is noted with leads in the right atrial appendage and right ventricle. Stable cardiomediastinal contours. Small bilateral pleural effusions are identified and there is pulmonary vascular congestion. Airspace disease is identified within the right lower lobe. Atelectasis noted in the left lower lobe. Remote healed right posterior 6th rib fracture. IMPRESSION: 1. Suspect right lower lobe pneumonia. 2. Small bilateral pleural effusions and pulmonary vascular congestion. Electronically Signed   By: Kerby Moors M.D.   On: 09/19/2021 14:18   DG Chest Port 1 View  Result Date: 09/19/2021 CLINICAL DATA:  Cough, shortness of breath. EXAM: PORTABLE CHEST 1 VIEW COMPARISON:  March 16, 2016. FINDINGS: Dual lead pacer device in place.  EKG leads project over the chest. Extreme lung bases excluded from view. Signs of bibasilar airspace disease in graded opacities at the RIGHT and LEFT lung base. LEFT hemidiaphragm not imaged. No visible pneumothorax. Visualized cardiomediastinal contours are grossly stable but obscured at the RIGHT lung base due to dense basilar airspace disease. On limited assessment there is no acute skeletal process. IMPRESSION: 1. Signs of bibasilar airspace disease and  graded opacities at the RIGHT and LEFT lung base. Dense basilar opacity on the RIGHT is suspicious for pneumonia and is partially visualized. Airspace disease at the bases likely  associated with pleural fluid. 2. Most inferior aspect of the chest, lung bases excluded from view. Consider repeat imaging of the chest to include the lung bases. Electronically Signed   By: Zetta Bills M.D.   On: 09/19/2021 12:12    Procedures Procedures   Medications Ordered in ED Medications  cefTRIAXone (ROCEPHIN) 1 g in sodium chloride 0.9 % 100 mL IVPB (1 g Intravenous New Bag/Given 09/19/21 1532)  azithromycin (ZITHROMAX) 500 mg in sodium chloride 0.9 % 250 mL IVPB (has no administration in time range)  0.9 %  sodium chloride infusion ( Intravenous New Bag/Given 09/19/21 1520)  LORazepam (ATIVAN) tablet 1 mg (1 mg Oral Given 09/19/21 1139)  albuterol (PROVENTIL) (2.5 MG/3ML) 0.083% nebulizer solution 5 mg (5 mg Nebulization Given 09/19/21 1222)  methylPREDNISolone sodium succinate (SOLU-MEDROL) 125 mg/2 mL injection 125 mg (125 mg Intravenous Given 09/19/21 1527)  furosemide (LASIX) injection 40 mg (40 mg Intravenous Given 09/19/21 1521)    ED Course  I have reviewed the triage vital signs and the nursing notes.  Pertinent labs & imaging results that were available during my care of the patient were reviewed by me and considered in my medical decision making (see chart for details).    MDM Rules/Calculators/A&P                         Patient is a 70 year old female with a history of COPD on oxygen at 2 L/min who presents with some anxiety in association with worsening cough and shortness of breath.  Her chest x-ray is concerning for pneumonia.  Her lungs sound rhonchorous and diminished.  She is requiring a bit more oxygen than her baseline.  She is currently at 3 L/min.  She was desating to about 87 to 88% on her normal 2 L/min.  She does not have any associated chest pain.  No other symptoms that sound more concerning for PE.  She has some degree of fluid overload also noted on imaging.  She was started on IV antibiotics and given a dose of Lasix.  She was also given albuterol  neb and a dose of Solu-Medrol.  Her COVID/flu test are negative.  Will admit for further treatment.  I spoke with Dr. [place] Crane who will admit the patient.    Final Clinical Impression(s) / ED Diagnoses Final diagnoses:  COPD exacerbation ([place])  Community acquired pneumonia of right lower lobe of lung    Rx / DC Orders ED Discharge Orders     None        Malvin Johns, MD 09/19/21 1541

## 2021-09-19 NOTE — ED Notes (Signed)
Patient seen before triage, patient on 2lpm O2 at home, on room air in lobby SpO2 81%, RR 24, HR 118 per pox.  To room 3 placed on 2.5lpm BBS distant, congested cough.

## 2021-09-19 NOTE — H&P (Signed)
History and Physical    PLEASE NOTE THAT DRAGON DICTATION SOFTWARE WAS USED IN THE CONSTRUCTION OF THIS NOTE.   Kathryn Buckley QVZ:[id number] DOB: 03-28-1951 DOA: 09/19/2021  PCP: Debbrah Alar, NP  Patient coming from: home   I have personally briefly reviewed patient's old medical records in [place]  Chief Complaint: sob  HPI: Kathryn Buckley is a 70 y.o. female with medical history significant for chronic hypoxic respiratory failure on 2 L continuous nasal cannula in setting of severe COPD, third-degree heart block status post pacemaker, GAD, depression, essential hypertension, hyperlipidemia, acquired hypothyroidism, steroid induced hyperglycemia, who is admitted to [facility] on 09/19/2021 by way of transfer from [place] with acute COPD exacerbation in the setting of suspected community-acquired pneumonia after presenting from home to the latter facility complaining of shortness of breath.  The patient reports 2 to 3 weeks of progressive shortness of breath associated with new onset productive cough.  She also notes associated subjective fever in the absence of chills, full body rigors, or generalized myalgias.Denies any associated orthopnea, PND, or new onset peripheral edema. No recent chest pain, diaphoresis, palpitations, N/V, pre-syncope, or syncope.  Denies any associated wheezing, mopped assist, new lower extremity erythema, or calf tenderness. Denies any recent trauma, travel, surgical procedures, or periods of prolonged diminished ambulatory status. No recent melena or hematochezia. Denies any associated recent headache, neck stiffness, abdominal pain, diarrhea, or rash.   She confirms a history of chronic hypoxic respiratory failure in the setting of severe COPD, noting baseline supplemental oxygen requirement of 2 L continuous nasal cannula.  Confirms that she is a former smoker, having completely quit smoking approximately 5 years  ago without subsequent resumption.  Reports good compliance with her home respiratory regimen, which includes Trelegy Ellipta.  She notes recent increased frequency of use of her prn albuterol inhaler, but has not noted any significant provement in her shortness of breath with this measure.  Medical history also notable for stage IIIb chronic kidney disease with baseline creatinine 1.5-1.7, with most recent prior serum creatinine data point noted to be 1.73 on 10/06/2020.     [facility] ED Course:  Vital signs in the ED were notable for the following:  Tetramex nine 9.3; heart rate 1 10-1 13; blood pressure 114/85-137/103; respiratory rate 15-19, oxygen saturation initially noted to be 88% on her baseline 2 L nasal cannula, with ensuing increased to 96% on 3 L nasal cannula.  Labs were notable for the following: BMP notable for the following: Sodium 139, bicarbonate 21, anion gap 9, BUN 32, creatinine 2.27, glucose 176.  CBC notable for white blood cell count 4500.  COVID-19/influenza PCR checked in the ED today, and found to be negative.  Imaging and additional notable ED work-up: EKG showed evidence of paced rhythm with heart rate 11, T wave inversions in leads I and aVL, which appear new relative to most recent prior EKG from 2014.  2 view chest x-ray showed no evidence of pacemaker wires, along with right lower lobe airspace opacity suggestive of right lower lobe pneumonia, along with small bilateral pleural effusions, while demonstrating no evidence of pulmonary edema or pneumothorax.  While in the ED, the following were administered: Albuterol nebulizer x1, Lasix 40 mg IV x1, Ativan 1 mg p.o. x2 doses, Solu-Medrol 125 mg IV x1, azithromycin, Rocephin.  Socially, the patient was transferred for overnight observation to the telemetry unit at [facility] for further evaluation and management of  acute COPD exacerbation associated with suspected commune acquired pneumonia as well as acute on  chronic hypoxic respiratory failure.      Review of Systems: As per HPI otherwise 10 point review of systems negative.   Past Medical History:  Diagnosis Date   COPD (chronic obstructive pulmonary disease) (HCC)    Depression    HTN (hypertension)    Hyperlipemia    Seizures (HCC)    Due to brain tumor that was removed in 2004   Thyroid disease     Past Surgical History:  Procedure Laterality Date   BRAIN SURGERY     age 14, she reports a brain tumor removal.      Social History:  reports that she has been smoking cigarettes. She has a 80.00 pack-year smoking history. She has never used smokeless tobacco. She reports current alcohol use of about 2.0 - 3.0 standard drinks per week. She reports that she does not use drugs.   Allergies  Allergen Reactions   Lamictal [Lamotrigine] Rash   Codeine     Other reaction(s): GI Upset (intolerance)    Family History  Problem Relation Age of Onset   Bipolar disorder Father    Heart disease Father    Cancer Father        prostate   Heart disease Mother    Hypertension Brother    Diabetes Brother    Diabetes Mellitus II Sister    Stroke Neg Hx    Hyperlipidemia Neg Hx     Family history reviewed and not pertinent    Prior to Admission medications   Medication Sig Start Date End Date Taking? Authorizing Provider  Acetaminophen (TYLENOL 8 HOUR PO) Take by mouth.    [provider]  albuterol (VENTOLIN HFA) 108 (90 Base) MCG/ACT inhaler Inhale 2 puffs into the lungs every 6 (six) hours as needed for wheezing or shortness of breath. 10/06/20   Sandford Craze, NP  amLODipine (NORVASC) 5 MG tablet Take 1 tablet (5 mg total) by mouth daily. 07/24/21   Sandford Craze, NP  atorvastatin (LIPITOR) 40 MG tablet TAKE 1 TABLET BY MOUTH EVERY DAY 07/24/21   Sandford Craze, NP  carbamazepine (TEGRETOL XR) 100 MG 12 hr tablet Take 1 tablet (100 mg total) by mouth 2 (two) times daily. 07/18/21 07/18/22  Arfeen, Phillips Grout,  MD  FLUoxetine (PROZAC) 40 MG capsule Take 1 capsule (40 mg total) by mouth daily. 07/18/21 07/18/22  Arfeen, Phillips Grout, MD  hydrOXYzine (ATARAX) 10 MG tablet Take 1 tablet (10 mg total) by mouth 2 (two) times daily as needed for anxiety. 09/18/21   Arfeen, Phillips Grout, MD  levothyroxine (SYNTHROID) 125 MCG tablet TAKE 1 TABLET(125 MCG) BY MOUTH DAILY BEFORE BREAKFAST 04/26/21   Sandford Craze, NP  loratadine (CLARITIN) 10 MG tablet Take 10 mg by mouth daily. Patient not taking: Reported on 09/04/2021    [provider]  omeprazole (PRILOSEC) 40 MG capsule TAKE 1 CAPSULE(40 MG) BY MOUTH DAILY 03/29/21   Sandford Craze, NP  QUEtiapine (SEROQUEL) 300 MG tablet TAKE 1 TABLET BY MOUTH AT BEDTIME 07/18/21   Arfeen, Phillips Grout, MD  TRELEGY ELLIPTA 100-62.5-25 MCG/INH AEPB Take 1 puff by mouth daily. 10/06/20   Sandford Craze, NP  varenicline (CHANTIX) 0.5 MG tablet Days 1 to 3: 0.5 mg (one tab) once daily. Days 4 to 7: 0.5 mg (one tab) twice daily. Maintenance (day 8 and later): 2 tabs by mouth twice daily Patient not taking: Reported on 09/04/2021 07/18/21  Debbrah Alar, NP  Zoster Vaccine Adjuvanted [facility]) injection Inject 0.5mg  IM now and again in 2-6 months. 02/20/21   Debbrah Alar, NP     Objective    Physical Exam: Vitals:   09/19/21 1720 09/19/21 1721 09/19/21 1844 09/19/21 1927  BP:  107/79 (!) 124/101   Pulse:      Resp:      Temp: (!) 97.4 F (36.3 C)  99.3 F (37.4 C)   TempSrc: Oral  Oral   SpO2:   (!) 88% 91%  Weight:      Height:        General: appears to be stated age; alert, oriented; mildly increased work of breathing noted Skin: warm, dry, no rash Head:  AT/[place] Mouth:  Oral mucosa membranes appear moist, normal dentition Neck: supple; trachea midline Heart: Tachycardic, but regular; did not appreciate any M/R/G Lungs: Right lower lobe rales noted, but otherwise CTAB, did not appreciate any wheezes, or rhonchi Abdomen: + BS; soft, ND,  NT Vascular: 2+ pedal pulses b/l; 2+ radial pulses b/l Extremities: no peripheral edema, no muscle wasting Neuro: strength and sensation intact in upper and lower extremities b/l     Labs on Admission: I have personally reviewed following labs and imaging studies  CBC: Recent Labs  Lab 09/19/21 1126  WBC 4.5  NEUTROABS 3.1  HGB 12.3  HCT 39.2  MCV 92.7  PLT 248*   Basic Metabolic Panel: Recent Labs  Lab 09/19/21 1126  NA 139  K 4.3  CL 109  CO2 21*  GLUCOSE 176*  BUN 32*  CREATININE 2.27*  CALCIUM 8.1*   GFR: Estimated Creatinine Clearance: 23.8 mL/min (A) (by C-G formula based on SCr of 2.27 mg/dL (H)). Liver Function Tests: No results for input(s): AST, ALT, ALKPHOS, BILITOT, PROT, ALBUMIN in the last 168 hours. No results for input(s): LIPASE, AMYLASE in the last 168 hours. No results for input(s): AMMONIA in the last 168 hours. Coagulation Profile: No results for input(s): INR, PROTIME in the last 168 hours. Cardiac Enzymes: No results for input(s): CKTOTAL, CKMB, CKMBINDEX, TROPONINI in the last 168 hours. BNP (last 3 results) No results for input(s): PROBNP in the last 8760 hours. HbA1C: No results for input(s): HGBA1C in the last 72 hours. CBG: No results for input(s): GLUCAP in the last 168 hours. Lipid Profile: No results for input(s): CHOL, HDL, LDLCALC, TRIG, CHOLHDL, LDLDIRECT in the last 72 hours. Thyroid Function Tests: No results for input(s): TSH, T4TOTAL, FREET4, T3FREE, THYROIDAB in the last 72 hours. Anemia Panel: No results for input(s): VITAMINB12, FOLATE, FERRITIN, TIBC, IRON, RETICCTPCT in the last 72 hours. Urine analysis:    Component Value Date/Time   BILIRUBINUR neg 07/16/2016 1154   PROTEINUR neg 07/16/2016 1154   UROBILINOGEN 1.0 07/16/2016 1154   NITRITE neg 07/16/2016 1154   LEUKOCYTESUR large (3+) (A) 07/16/2016 1154    Radiological Exams on Admission: DG Chest 2 View  Result Date: 09/19/2021 CLINICAL DATA:  Cough  EXAM: CHEST - 2 VIEW COMPARISON:  Earlier today FINDINGS: Left chest wall pacer device is noted with leads in the right atrial appendage and right ventricle. Stable cardiomediastinal contours. Small bilateral pleural effusions are identified and there is pulmonary vascular congestion. Airspace disease is identified within the right lower lobe. Atelectasis noted in the left lower lobe. Remote healed right posterior 6th rib fracture. IMPRESSION: 1. Suspect right lower lobe pneumonia. 2. Small bilateral pleural effusions and pulmonary vascular congestion. Electronically Signed   By: Queen Slough.D.  On: 09/19/2021 14:18   DG Chest Port 1 View  Result Date: 09/19/2021 CLINICAL DATA:  Cough, shortness of breath. EXAM: PORTABLE CHEST 1 VIEW COMPARISON:  March 16, 2016. FINDINGS: Dual lead pacer device in place.  EKG leads project over the chest. Extreme lung bases excluded from view. Signs of bibasilar airspace disease in graded opacities at the RIGHT and LEFT lung base. LEFT hemidiaphragm not imaged. No visible pneumothorax. Visualized cardiomediastinal contours are grossly stable but obscured at the RIGHT lung base due to dense basilar airspace disease. On limited assessment there is no acute skeletal process. IMPRESSION: 1. Signs of bibasilar airspace disease and graded opacities at the RIGHT and LEFT lung base. Dense basilar opacity on the RIGHT is suspicious for pneumonia and is partially visualized. Airspace disease at the bases likely associated with pleural fluid. 2. Most inferior aspect of the chest, lung bases excluded from view. Consider repeat imaging of the chest to include the lung bases. Electronically Signed   By: Zetta Bills M.D.   On: 09/19/2021 12:12     EKG: Independently reviewed, with result as described above.    Assessment/Plan    Principal Problem:   CAP (community acquired pneumonia) Active Problems:   Depression   HTN (hypertension)   Hypothyroid   GERD  (gastroesophageal reflux disease)   Hyperglycemia   COPD with acute exacerbation (HCC)   Acute on chronic respiratory failure with hypoxia (HCC)   SOB (shortness of breath)   AKI (acute kidney injury) ([place])    #) Acute COPD exacerbation: in the context of a documented history of COPD, diagnosis of acute exacerbation on the basis of 2 weeks of progressive shortness of breath, increased work breathing, and acute on chronic hypoxic respiratory failure, with initial oxygen saturations in the high 80s on baseline 2 L nasal cannula subsequently improving into the mid 90s on 3 L nasal cannula.  Suspect a contribution from community-acquired pneumonia given presenting CXR demonstrating evidence of right lower lobe airspace opacity suggestive of pneumonia, without evidence of edema or pneumothorax. COVID-19/influenza PCR were checked today and found to be negative.  Appears compliant with outpatient respiratory regimen which includes , Trelegy Ellipta.  Worsening shortness of breath occurred in spite of increased frequency of use of prn albuterol inhaler at home.  Confirms that she is a former smoker, as further quantified above.  In the setting of presenting mild tachycardia, will elect to pursue Xopenex as opposed to albuterol as preferred beta-2 agonist for now.    Plan: monitor continuous pulse oxymetry. Monitor on telemetry. Solumedrol. Scheduled Xopenex/ipratropium nebulizers every 6 hours.  Prn Xopenex nebulizer.  CMP/CBC in the morning.  Check serum magnesium/phosphorus levels. Will attempt additional chart review to evaluate most recent PFT results.  Further evaluation management of suspected community-acquired pneumonia, including continuation of azithromycin/Rocephin. Check blood gas. Add-on procalcitonin.  Check BNP.  Flutter valve/incentive spirometry.        #) Community-acquired pneumonia: Diagnosis in the basis of new onset shortness of breath associated with new onset productive cough,  subjective fever, and chest x-ray showing evidence of right lower lobe airspace opacity concerning for pneumonia.  At this time, mild tachycardia is the only SIRS criteria present, and therefore criteria for sepsis not currently met.  No evidence of additional infectious process at this time, although check urinalysis, particularly to evaluate AKI on CKD 3B, as further detailed below.  COVID-19/[place] PCR checked today were negative.  Suspected CAP is suspected to be contributory to presenting acute COPD  exacerbation, as above.  Azithromycin/Rocephin started at [facility] earlier today.   Plan: Check blood cultures x2 and check sputum culture.  Abx: Continue azithromycin/Rocephin.  Repeat CBC w/ diff in AM.  Monitor continuous pulse oximetry.  Monitor on telemetry. Prn acetaminophen for fever.  Add on procalcitonin.  Strep urine antigen.  Flutter valve/incentive spirometry.  Further evaluation management of consequential acute COPD exacerbation, including scheduled and as needed nebulizer treatments.        #) Acute on chronic hypoxic respiratory failure: in the context of acute respiratory symptoms, including shortness of breath, presenting oxygen saturation noted to be 88% on baseline 2 L nasal cannula, socially improving into the mid 90s on 3 L nasal cannula.  Appears to be on the basis of acute COPD exacerbation, with influence from community-acquired pneumonia, as above, in setting of suggest from right lower lobe infiltrate on presenting chest x-ray, which also showed small bilateral pleural effusions.  While history is not particularly suggestive of acutely decompensated heart failure, and chest x-ray shows no evidence of interstitial or pulmonary edema, will also check BNP.  Of note, patient received Lasix 40 mg IV x1 [place] ED today, in context of no known history of congestive heart failure, with my preliminary chart review revealing no evidence of prior echocardiogram  result.   In terms of other considered etiologies, ACS appears less likely at this time in the absence of any recent CP, and with EKG showing only nonspecific changes in the absence of overt evidence of acute ischemic changes, as above. Clinically, presentation is less suggestive of acute PE at this time. COVID-19/Influenza PCR were negative when checked today.   Plan: further evaluation/management of presenting acute COPD exacerbation as well as suspected community-acquired pneumonia, as above. Monitor continuous pulse ox with prn supplemental O2 to maintain O2 sats greater than or equal to 92%. monitor on telemetry. CMP/CBC in the AM. Check serum Mg and Phos levels. Check blood gas. Flutter valve, incentive spirometry.  Check BNP, procalcitonin.      #) Acute kidney injury superimposed on stage IIIb CKD: In context of a documented history of CKD 3B, baseline creatinine 1.5-1.7, with most recent prior serum creatinine data point 1.73 in January 2022, presenting serum creatinine found to be elevated at 2.27.  Suspect that this is prerenal in nature as a result of diminished renal perfusion due to decline in oxygen delivery capacity in the setting of presenting acute on chronic hypoxic respiratory failure, as above.  As the patient does not appear to be overtly clinically dry at this time, and there is some evidence of pulmonary vascular congestion as well as small bilateral pleural effusions on presenting chest x-ray, will refrain from aggressive IV fluids at this time.   Plan: Monitor strict I's and O's and daily weights.  Tempt avoid nephrotoxic agents.  Urinalysis with microscopy.  Check random urine sodium as well as random urine creatinine.  Further evaluation management presenting acute on chronic hypoxic respiratory failure, as above.  Monitor continuous pulse oximetry.  Add on serum magnesium level.  BMP in the morning.      #) Hyperglycemia: Presenting serum glucose 176, in the absence of  anion gap metabolic acidosis.  Has a documented history of steroid-induced hyperglycemia in the absence of any documentation of a history of diabetes.  Of note, most recent hemoglobin A1c noted to be 6.4% in October 2022.  As current plan for presenting acute COPD exacerbation includes continuation of systemic  corticosteroids, will monitor ensuing serum glucose level via scheduled Accu-Cheks, as below.  Plan: Accu-Cheks before every meal and at bedtime with low-dose sliding scale insulin.       #) Generalized anxiety disorder: Documented history of such fluoxetine as well as prn hydroxyzine as an outpatient.  Plan: Continue home fluoxetine.  Prn Ativan for anxiety.  Check TSH.      #) Depression: Documented history of such, on fluoxetine at home.  Plan: Continue home fluoxetine.       #) Essential Hypertension: documented h/o such, with outpatient antihypertensive regimen including amlodipine.  SBP's in the ED today:  110's - 130's mmHg .  In the context of presenting suspected infection in the right lower lobe pneumonia, will Norvasc for now.  Plan: Close monitoring of subsequent BP via routine VS. hold home Norvasc for now, as above.       #) Hyperlipidemia: documented h/o such. On high intensity atorvastatin as outpatient.    Plan: continue home statin.         #) acquired hypothyroidism: documented h/o such, on Synthroid as outpatient.  In the setting of presenting mild tachycardia, which is likely from multi factorial acute systemic influences including presenting suspected pneumonia as well as compensatory response to diminished oxygen delivery capacity as a consequence of presenting acute on chronic hypoxic respiratory failure.  However, in the setting of a documented history of acquired hypothyroidism, also check TSH.  Plan: cont home Synthroid.  Check TSH, as above.       #) Seizure disorder: Documented history of such, on carbamazepine as  outpatient.  Plan: Continue home carbamazepine.      #) GERD: documented h/o such; on omeprazole as outpatient.   Plan: continue home PPI.       DVT prophylaxis: SCD's   Code Status: Full code Family Communication: none Disposition Plan: Per Rounding Team Consults called: none;  Admission status: Observation; med telemetry   PLEASE NOTE THAT DRAGON DICTATION SOFTWARE WAS USED IN THE CONSTRUCTION OF THIS NOTE.   Lowry DO [facility] [place]   09/19/2021, 8:00 PM

## 2021-09-20 ENCOUNTER — Inpatient Hospital Stay (HOSPITAL_COMMUNITY): Payer: Medicare Other

## 2021-09-20 DIAGNOSIS — N289 Disorder of kidney and ureter, unspecified: Secondary | ICD-10-CM | POA: Diagnosis not present

## 2021-09-20 DIAGNOSIS — E039 Hypothyroidism, unspecified: Secondary | ICD-10-CM | POA: Diagnosis not present

## 2021-09-20 DIAGNOSIS — I10 Essential (primary) hypertension: Secondary | ICD-10-CM | POA: Diagnosis not present

## 2021-09-20 DIAGNOSIS — Z20822 Contact with and (suspected) exposure to covid-19: Secondary | ICD-10-CM | POA: Diagnosis not present

## 2021-09-20 DIAGNOSIS — J9621 Acute and chronic respiratory failure with hypoxia: Secondary | ICD-10-CM | POA: Diagnosis not present

## 2021-09-20 DIAGNOSIS — K219 Gastro-esophageal reflux disease without esophagitis: Secondary | ICD-10-CM | POA: Diagnosis not present

## 2021-09-20 DIAGNOSIS — I959 Hypotension, unspecified: Secondary | ICD-10-CM | POA: Diagnosis not present

## 2021-09-20 DIAGNOSIS — F32A Depression, unspecified: Secondary | ICD-10-CM | POA: Diagnosis not present

## 2021-09-20 DIAGNOSIS — J441 Chronic obstructive pulmonary disease with (acute) exacerbation: Secondary | ICD-10-CM | POA: Diagnosis not present

## 2021-09-20 DIAGNOSIS — E874 Mixed disorder of acid-base balance: Secondary | ICD-10-CM | POA: Diagnosis not present

## 2021-09-20 DIAGNOSIS — Z7401 Bed confinement status: Secondary | ICD-10-CM | POA: Diagnosis not present

## 2021-09-20 DIAGNOSIS — J189 Pneumonia, unspecified organism: Secondary | ICD-10-CM | POA: Diagnosis not present

## 2021-09-20 DIAGNOSIS — F17200 Nicotine dependence, unspecified, uncomplicated: Secondary | ICD-10-CM

## 2021-09-20 DIAGNOSIS — N1832 Chronic kidney disease, stage 3b: Secondary | ICD-10-CM | POA: Diagnosis present

## 2021-09-20 DIAGNOSIS — F319 Bipolar disorder, unspecified: Secondary | ICD-10-CM | POA: Diagnosis present

## 2021-09-20 DIAGNOSIS — G9341 Metabolic encephalopathy: Secondary | ICD-10-CM | POA: Diagnosis not present

## 2021-09-20 DIAGNOSIS — I119 Hypertensive heart disease without heart failure: Secondary | ICD-10-CM | POA: Diagnosis not present

## 2021-09-20 DIAGNOSIS — E785 Hyperlipidemia, unspecified: Secondary | ICD-10-CM | POA: Diagnosis not present

## 2021-09-20 DIAGNOSIS — G40909 Epilepsy, unspecified, not intractable, without status epilepticus: Secondary | ICD-10-CM | POA: Diagnosis present

## 2021-09-20 DIAGNOSIS — I442 Atrioventricular block, complete: Secondary | ICD-10-CM | POA: Diagnosis not present

## 2021-09-20 DIAGNOSIS — Z9981 Dependence on supplemental oxygen: Secondary | ICD-10-CM | POA: Diagnosis not present

## 2021-09-20 DIAGNOSIS — R7989 Other specified abnormal findings of blood chemistry: Secondary | ICD-10-CM

## 2021-09-20 DIAGNOSIS — Z66 Do not resuscitate: Secondary | ICD-10-CM | POA: Diagnosis not present

## 2021-09-20 DIAGNOSIS — Z515 Encounter for palliative care: Secondary | ICD-10-CM | POA: Diagnosis not present

## 2021-09-20 DIAGNOSIS — J181 Lobar pneumonia, unspecified organism: Secondary | ICD-10-CM | POA: Diagnosis present

## 2021-09-20 DIAGNOSIS — F419 Anxiety disorder, unspecified: Secondary | ICD-10-CM

## 2021-09-20 DIAGNOSIS — J9622 Acute and chronic respiratory failure with hypercapnia: Secondary | ICD-10-CM | POA: Diagnosis not present

## 2021-09-20 DIAGNOSIS — N179 Acute kidney failure, unspecified: Secondary | ICD-10-CM | POA: Diagnosis not present

## 2021-09-20 DIAGNOSIS — I130 Hypertensive heart and chronic kidney disease with heart failure and stage 1 through stage 4 chronic kidney disease, or unspecified chronic kidney disease: Secondary | ICD-10-CM | POA: Diagnosis not present

## 2021-09-20 DIAGNOSIS — I2721 Secondary pulmonary arterial hypertension: Secondary | ICD-10-CM | POA: Diagnosis present

## 2021-09-20 DIAGNOSIS — Z7189 Other specified counseling: Secondary | ICD-10-CM | POA: Diagnosis not present

## 2021-09-20 DIAGNOSIS — J440 Chronic obstructive pulmonary disease with acute lower respiratory infection: Secondary | ICD-10-CM | POA: Diagnosis present

## 2021-09-20 DIAGNOSIS — G9340 Encephalopathy, unspecified: Secondary | ICD-10-CM | POA: Diagnosis not present

## 2021-09-20 DIAGNOSIS — R739 Hyperglycemia, unspecified: Secondary | ICD-10-CM | POA: Diagnosis not present

## 2021-09-20 DIAGNOSIS — R0609 Other forms of dyspnea: Secondary | ICD-10-CM

## 2021-09-20 DIAGNOSIS — E7220 Disorder of urea cycle metabolism, unspecified: Secondary | ICD-10-CM | POA: Diagnosis not present

## 2021-09-20 DIAGNOSIS — I5021 Acute systolic (congestive) heart failure: Secondary | ICD-10-CM | POA: Diagnosis not present

## 2021-09-20 DIAGNOSIS — D696 Thrombocytopenia, unspecified: Secondary | ICD-10-CM | POA: Diagnosis present

## 2021-09-20 DIAGNOSIS — R0602 Shortness of breath: Secondary | ICD-10-CM | POA: Diagnosis not present

## 2021-09-20 DIAGNOSIS — J449 Chronic obstructive pulmonary disease, unspecified: Secondary | ICD-10-CM | POA: Diagnosis not present

## 2021-09-20 LAB — ECHOCARDIOGRAM COMPLETE
Height: 69 in
MV M vel: 4.62 m/s
MV Peak grad: 85.4 mmHg
Radius: 0.5 cm
S' Lateral: 5.1 cm
Weight: 2574.97 oz

## 2021-09-20 LAB — CBC WITH DIFFERENTIAL/PLATELET
Abs Immature Granulocytes: 0.01 10*3/uL (ref 0.00–0.07)
Basophils Absolute: 0 10*3/uL (ref 0.0–0.1)
Basophils Relative: 0 %
Eosinophils Absolute: 0 10*3/uL (ref 0.0–0.5)
Eosinophils Relative: 0 %
HCT: 36.3 % (ref 36.0–46.0)
Hemoglobin: 11.2 g/dL — ABNORMAL LOW (ref 12.0–15.0)
Immature Granulocytes: 0 %
Lymphocytes Relative: 23 %
Lymphs Abs: 0.6 10*3/uL — ABNORMAL LOW (ref 0.7–4.0)
MCH: 29.2 pg (ref 26.0–34.0)
MCHC: 30.9 g/dL (ref 30.0–36.0)
MCV: 94.5 fL (ref 80.0–100.0)
Monocytes Absolute: 0.2 10*3/uL (ref 0.1–1.0)
Monocytes Relative: 9 %
Neutro Abs: 1.8 10*3/uL (ref 1.7–7.7)
Neutrophils Relative %: 68 %
Platelets: 114 10*3/uL — ABNORMAL LOW (ref 150–400)
RBC: 3.84 MIL/uL — ABNORMAL LOW (ref 3.87–5.11)
RDW: 17.6 % — ABNORMAL HIGH (ref 11.5–15.5)
WBC: 2.7 10*3/uL — ABNORMAL LOW (ref 4.0–10.5)
nRBC: 0 % (ref 0.0–0.2)

## 2021-09-20 LAB — URINALYSIS, COMPLETE (UACMP) WITH MICROSCOPIC
Bilirubin Urine: NEGATIVE
Glucose, UA: NEGATIVE mg/dL
Ketones, ur: NEGATIVE mg/dL
Leukocytes,Ua: NEGATIVE
Nitrite: NEGATIVE
Protein, ur: NEGATIVE mg/dL
Specific Gravity, Urine: 1.004 — ABNORMAL LOW (ref 1.005–1.030)
pH: 5 (ref 5.0–8.0)

## 2021-09-20 LAB — COMPREHENSIVE METABOLIC PANEL
ALT: 64 U/L — ABNORMAL HIGH (ref 0–44)
AST: 52 U/L — ABNORMAL HIGH (ref 15–41)
Albumin: 3.4 g/dL — ABNORMAL LOW (ref 3.5–5.0)
Alkaline Phosphatase: 106 U/L (ref 38–126)
Anion gap: 9 (ref 5–15)
BUN: 35 mg/dL — ABNORMAL HIGH (ref 8–23)
CO2: 19 mmol/L — ABNORMAL LOW (ref 22–32)
Calcium: 8 mg/dL — ABNORMAL LOW (ref 8.9–10.3)
Chloride: 112 mmol/L — ABNORMAL HIGH (ref 98–111)
Creatinine, Ser: 2.26 mg/dL — ABNORMAL HIGH (ref 0.44–1.00)
GFR, Estimated: 23 mL/min — ABNORMAL LOW (ref >60)
Glucose, Bld: 118 mg/dL — ABNORMAL HIGH (ref 70–99)
Potassium: 4.7 mmol/L (ref 3.5–5.1)
Sodium: 140 mmol/L (ref 135–145)
Total Bilirubin: 0.7 mg/dL (ref 0.3–1.2)
Total Protein: 5.7 g/dL — ABNORMAL LOW (ref 6.5–8.1)

## 2021-09-20 LAB — PHOSPHORUS: Phosphorus: 6 mg/dL — ABNORMAL HIGH (ref 2.5–4.6)

## 2021-09-20 LAB — BLOOD GAS, VENOUS
Acid-base deficit: 6.5 mmol/L — ABNORMAL HIGH (ref 0.0–2.0)
Bicarbonate: 20.5 mmol/L (ref 20.0–28.0)
O2 Saturation: 89.1 %
Patient temperature: 98.6
pCO2, Ven: 49.7 mmHg (ref 44.0–60.0)
pH, Ven: 7.239 — ABNORMAL LOW (ref 7.250–7.430)
pO2, Ven: 66.1 mmHg — ABNORMAL HIGH (ref 32.0–45.0)

## 2021-09-20 LAB — GLUCOSE, CAPILLARY
Glucose-Capillary: 114 mg/dL — ABNORMAL HIGH (ref 70–99)
Glucose-Capillary: 175 mg/dL — ABNORMAL HIGH (ref 70–99)
Glucose-Capillary: 324 mg/dL — ABNORMAL HIGH (ref 70–99)
Glucose-Capillary: 126 mg/dL — ABNORMAL HIGH (ref 70–99)

## 2021-09-20 LAB — STREP PNEUMONIAE URINARY ANTIGEN: Strep Pneumo Urinary Antigen: NEGATIVE

## 2021-09-20 LAB — CREATININE, URINE, RANDOM: Creatinine, Urine: 16.08 mg/dL

## 2021-09-20 LAB — PROCALCITONIN: Procalcitonin: 0.23 ng/mL

## 2021-09-20 LAB — SODIUM, URINE, RANDOM: Sodium, Ur: 51 mmol/L

## 2021-09-20 LAB — MAGNESIUM: Magnesium: 2.1 mg/dL (ref 1.7–2.4)

## 2021-09-20 MED ORDER — PERFLUTREN LIPID MICROSPHERE
1.0000 mL | INTRAVENOUS | Status: AC | PRN
Start: 2021-09-20 — End: 2021-09-20
  Administered 2021-09-20: 12:00:00 3 mL via INTRAVENOUS
  Filled 2021-09-20: qty 10

## 2021-09-20 MED ORDER — FUROSEMIDE 10 MG/ML IJ SOLN
40.0000 mg | Freq: Every day | INTRAMUSCULAR | Status: DC
  Administered 2021-09-20 – 2021-09-21 (×2): 40 mg via INTRAVENOUS
  Filled 2021-09-20 (×2): qty 4

## 2021-09-20 MED ORDER — NICOTINE 21 MG/24HR TD PT24
21.0000 mg | MEDICATED_PATCH | Freq: Every day | TRANSDERMAL | Status: DC
  Administered 2021-09-20 – 2021-10-04 (×15): 21 mg via TRANSDERMAL
  Filled 2021-09-20 (×15): qty 1

## 2021-09-20 MED ORDER — LIVING BETTER WITH HEART FAILURE BOOK: Freq: Once | Status: AC

## 2021-09-20 MED ORDER — SALINE SPRAY 0.65 % NA SOLN
1.0000 | NASAL | Status: DC | PRN
  Administered 2021-09-20: 21:00:00 1 via NASAL
  Filled 2021-09-20: qty 44

## 2021-09-20 MED ORDER — ORAL CARE MOUTH RINSE
15.0000 mL | Freq: Two times a day (BID) | OROMUCOSAL | Status: DC
  Administered 2021-09-20 – 2021-10-04 (×18): 15 mL via OROMUCOSAL

## 2021-09-20 NOTE — Telephone Encounter (Signed)
Sharon ON 09/19/21

## 2021-09-20 NOTE — Progress Notes (Signed)
Echocardiogram 2D Echocardiogram has been performed.  Oneal Deputy Megen Madewell RDCS 09/20/2021, 12:23 PM

## 2021-09-20 NOTE — Progress Notes (Signed)
Pt had episode of nonsustained v-tach at 18:43.  MD notified.  Will continue to monitor.

## 2021-09-20 NOTE — Evaluation (Signed)
Physical Therapy Evaluation Patient Details Name: Kathryn Buckley MRN: [id number] DOB: October 28, 1950 Today's Date: 09/20/2021  History of Present Illness  70 year old female presenting with progressive shortness of breath, productive cough, fever, rigor and myalgia for about 2 to 3 weeks and admitted 09/19/21 for acute on chronic hypoxic respiratory failure in the setting of COPD exacerbation and community-acquired pneumonia, and AKI on CKD-3B.  PMH of COPD/chronic hypoxic RF on 2 L, CKD-3B, CHB/PPM, HTN, hypothyroidism, hyperlipidemia, anxiety, depression and tobacco use disorder (2 PPD)  Clinical Impression  Pt admitted with above diagnosis. Pt currently with functional limitations due to the deficits listed below (see PT Problem List). Pt will benefit from skilled PT to increase their independence and safety with mobility to allow discharge to the venue listed below.  Pt reports only being able to tolerate short distances and limited physical exertional tasks with frequent rest breaks over the past couple weeks.  Pt lives alone in an apt with no stairs and family frequently checks in and brings groceries (pt does not drive).  Pt feels she will be able to return home upon d/c.  Pt states she has any needed DME and encouraged to at least use cane or RW upon d/c for safety while recovering from this hospitalization.  Pt may benefit from HHPT if agreeable.       Recommendations for follow up therapy are one component of a multi-disciplinary discharge planning process, led by the attending physician.  Recommendations may be updated based on patient status, additional functional criteria and insurance authorization.  Follow Up Recommendations Home health PT    Assistance Recommended at Discharge PRN  Functional Status Assessment Patient has had a recent decline in their functional status and demonstrates the ability to make significant improvements in function in a reasonable and predictable amount  of time.  Equipment Recommendations  None recommended by PT    Recommendations for Other Services       Precautions / Restrictions Precautions Precautions: Fall Precaution Comments: monitor sats      Mobility  Bed Mobility Overal bed mobility: Needs Assistance Bed Mobility: Supine to Sit     Supine to sit: Supervision          Transfers Overall transfer level: Needs assistance Equipment used: None Transfers: Sit to/from Stand Sit to Stand: Min guard           General transfer comment: min/guard for safety, mildly unsteady with rise however pt self corrected    Ambulation/Gait Ambulation/Gait assistance: Min guard;Min assist Gait Distance (Feet): 25 Feet (x2) Assistive device: None Gait Pattern/deviations: Step-through pattern;Decreased stride length       General Gait Details: light assist for stability since pt declined using assistive device (however encouraged using at home for safety); required seated rest break due to dyspnea, Spo2 88% on 2L O2 [place] however dropped to 86% upon returning to room so increased to 3L upon sitting in recliner (improved to 94% and RN aware pt left on 3L at this time for recovery)  Stairs            Wheelchair Mobility    Modified Rankin (Stroke Patients Only)       Balance Overall balance assessment: Mild deficits observed, not formally tested (pt denies falls)  Pertinent Vitals/Pain Pain Assessment: No/denies pain    Home Living Family/patient expects to be discharged to:: Private residence Living Arrangements: Alone Available Help at Discharge: Family;Available PRN/intermittently Type of Home: Apartment Home Access: Level entry       Home Layout: One level Home Equipment: Conservation officer, nature (2 wheels);Cane - single point;Wheelchair - manual Additional Comments: baseline 2L O2 [place]    Prior Function Prior Level of Function : Independent/Modified  Independent             Mobility Comments: pt reports ambulating short distance and taking frequent rest breaks for the past couple weeks, family assists with IADLs and driving       Hand Dominance        Extremity/Trunk Assessment        Lower Extremity Assessment Lower Extremity Assessment: Generalized weakness    Cervical / Trunk Assessment Cervical / Trunk Assessment: Normal  Communication   Communication: No difficulties  Cognition Arousal/Alertness: Awake/alert Behavior During Therapy: WFL for tasks assessed/performed Overall Cognitive Status: Within Functional Limits for tasks assessed                                          General Comments      Exercises     Assessment/Plan    PT Assessment Patient needs continued PT services  PT Problem List Decreased strength;Decreased mobility;Cardiopulmonary status limiting activity;Decreased activity tolerance;Decreased balance       PT Treatment Interventions Gait training;DME instruction;Therapeutic exercise;Balance training;Functional mobility training;Therapeutic activities;Patient/family education    PT Goals (Current goals can be found in the Care Plan section)  Acute Rehab PT Goals PT Goal Formulation: With patient Time For Goal Achievement: 10/04/21 Potential to Achieve Goals: Good    Frequency Min 3X/week   Barriers to discharge        Co-evaluation               AM-PAC PT "6 Clicks" Mobility  Outcome Measure Help needed turning from your back to your side while in a flat bed without using bedrails?: A Little Help needed moving from lying on your back to sitting on the side of a flat bed without using bedrails?: A Little Help needed moving to and from a bed to a chair (including a wheelchair)?: A Little Help needed standing up from a chair using your arms (e.g., wheelchair or bedside chair)?: A Little Help needed to walk in hospital room?: A Little Help needed  climbing 3-5 steps with a railing? : A Lot 6 Click Score: 17    End of Session Equipment Utilized During Treatment: Gait belt Activity Tolerance: Patient tolerated treatment well Patient left: in chair;with call bell/phone within reach Nurse Communication: Mobility status PT Visit Diagnosis: Other abnormalities of gait and mobility (R26.89)    Time: 3428-7681 PT Time Calculation (min) (ACUTE ONLY): 13 min   Charges:   PT Evaluation $PT Eval Low Complexity: 1 Low     Kati PT, DPT Acute Rehabilitation Services Pager: [phone] Office: [phone]   Kathryn Buckley 09/20/2021, 12:52 PM

## 2021-09-20 NOTE — Progress Notes (Signed)
PROGRESS NOTE  Kathryn Buckley BDZ:[id number] DOB: [birth year]   PCP: Debbrah Alar, NP  Patient is from: Home.  Lives alone.  Independently ambulates at baseline.  DOA: 09/19/2021 LOS: 0  Chief complaints:  Chief Complaint  Patient presents with   Shortness of Breath     Brief Narrative / Interim history: 70 year old F with PMH of COPD/chronic hypoxic RF on 2 L, CKD-3B, CHB/PPM, HTN, hypothyroidism, hyperlipidemia, anxiety, depression and tobacco use disorder (2 PPD) presenting with progressive shortness of breath, productive cough, fever, rigor and myalgia for about 2 to 3 weeks, and admitted for acute on chronic hypoxic respiratory failure in the setting of COPD exacerbation and community-acquired pneumonia, and AKI on CKD-3B.  CXR with RLL infiltrate. Cr 2.3 (baseline 1.5-1.7).  Started on ceftriaxone azithromycin, systemic steroid and breathing treatments.  BNP ordered and elevated to 1600.  Subjective: Seen and examined earlier this morning.  Reports improvement in her breathing but not quite back to baseline.  Still with productive cough with whitish phlegm.  She denies orthopnea, PND or lower extremity edema.  She denies GI or UTI symptoms.  Saturating at 88% on 3 L at rest.  Objective: Vitals:   09/20/21 0111 09/20/21 0435 09/20/21 0519 09/20/21 0840  BP: 108/78 121/81    Pulse: (!) 108 (!) 108    Resp: 20 20    Temp: 98 F (36.7 C) (!) 97.3 F (36.3 C)    TempSrc: Axillary Oral    SpO2: 97% 95%  92%  Weight:   73 kg   Height:        Examination:  GENERAL: No apparent distress.  Nontoxic. HEENT: MMM.  Vision and hearing grossly intact.  NECK: Supple.  Notable JVD. RESP: 88% on 3 L at rest.  No IWOB.  No rhonchi, crackles or wheeze. CVS:  RRR. Heart sounds normal.  ABD/GI/GU: BS+. Abd soft, NTND.  MSK/EXT:  Moves extremities. No apparent deformity. No edema.  SKIN: no apparent skin lesion or wound NEURO: Awake, alert and oriented appropriately.  No  apparent focal neuro deficit. PSYCH: Calm. Normal affect.   Procedures:  None  Microbiology summarized: TMHDQ-22 and influenza PCR nonreactive. Blood cultures NGTD.  Assessment & Plan: Acute on chronic respiratory failure with hypoxia due to COPD exacerbation in the setting of RLL pneumonia: Desaturated to 88% on home 2 L requiring 3 L.  She is saturating 88% on 3 L at rest during my exam.  Still with productive cough. -Continue ceftriaxone and azithromycin -Continue IV Solu-Medrol, Xopenex and Atrovent -Incentive spirometry, OOB, PT, OT -Wean oxygen as able.  Ambulatory saturation assessment -Encouraged smoking cessation  Elevated BNP: 1600.  No cardinal symptoms or history of CHF.  Appears euvolemic on exam except for JVD.  She might have pulmonary hypertension. -Check echocardiogram -Initiated CHF pathway -Trial of IV Lasix 40 mg -Monitor fluid status, renal functions and electrolytes  AKI/azotemia on CKD-3B: Cr seems to be plateauing. Recent Labs    10/06/20 1204 09/19/21 1126 09/20/21 0501  BUN 23 32* 35*  CREATININE 1.73* 2.27* 2.26*  -Recheck in the morning -Consider renal ultrasound if no further improvement or worse.  History of third-degree AVB/PPM  Essential hypertension: Normotensive. -Continue amlodipine -Now on IV Lasix.  Anxiety and depression: Stable -Continue home medications  Hypothyroidism -Continue home Synthroid  Tobacco use disorder: Currently smokes about 2 pack a day. -Extensively discussed the importance of smoking cessation -Nicotine patch  None anion gap metabolic acidosis: Likely due to AKI. -Continue monitoring  Elevated  liver enzymes: Due to statin?  Congestive hepatopathy? -Continue monitoring -Consider further work-up if no improvement  Thrombocytopenia: Could be due to pneumonia. -Continue monitoring  Body mass index is 23.77 kg/m.         DVT prophylaxis:  SCDs Start: 09/19/21 1924  Code Status: Full code Family  Communication: Patient and/or RN. Available if any question.  Level of care: Telemetry Status is: Observation  The patient will require care spanning > 2 midnights and should be moved to inpatient because: Acute on chronic respiratory failure with hypoxia due to COPD exacerbation and pneumonia requiring IV antibiotics and IV steroid, and possible acute CHF requiring further evaluation and IV diuretics   Final disposition: Likely home once medically stable.   Consultants:  None   Sch Meds:  Scheduled Meds:  atorvastatin  40 mg Oral Daily   carbamazepine  100 mg Oral BID   FLUoxetine  40 mg Oral Daily   furosemide  40 mg Intravenous Daily   insulin aspart  0-9 Units Subcutaneous TID WC   ipratropium  0.5 mg Nebulization TID   levalbuterol  1.25 mg Nebulization TID   levothyroxine  125 mcg Oral Q0600   mouth rinse  15 mL Mouth Rinse BID   methylPREDNISolone (SOLU-MEDROL) injection  80 mg Intravenous Q12H   nicotine  21 mg Transdermal Daily   pantoprazole  40 mg Oral Daily   QUEtiapine  300 mg Oral QHS   Continuous Infusions:  sodium chloride 10 mL/hr at 09/19/21 1520   azithromycin     cefTRIAXone (ROCEPHIN)  IV     PRN Meds:.sodium chloride, acetaminophen **OR** acetaminophen, benzonatate, levalbuterol, LORazepam  Antimicrobials: Anti-infectives (From admission, onward)    Start     Dose/Rate Route Frequency Ordered Stop   09/20/21 1600  azithromycin (ZITHROMAX) 500 mg in sodium chloride 0.9 % 250 mL IVPB        500 mg 250 mL/hr over 60 Minutes Intravenous Every 24 hours 09/19/21 1925     09/20/21 1500  cefTRIAXone (ROCEPHIN) 1 g in sodium chloride 0.9 % 100 mL IVPB        1 g 200 mL/hr over 30 Minutes Intravenous Every 24 hours 09/19/21 1925     09/19/21 1515  cefTRIAXone (ROCEPHIN) 1 g in sodium chloride 0.9 % 100 mL IVPB        1 g 200 mL/hr over 30 Minutes Intravenous  Once 09/19/21 1503 09/19/21 1602   09/19/21 1515  azithromycin (ZITHROMAX) 500 mg in sodium  chloride 0.9 % 250 mL IVPB        500 mg 250 mL/hr over 60 Minutes Intravenous  Once 09/19/21 1503 09/19/21 1746        I have personally reviewed the following labs and images: CBC: Recent Labs  Lab 09/19/21 1126 09/20/21 0501  WBC 4.5 2.7*  NEUTROABS 3.1 1.8  HGB 12.3 11.2*  HCT 39.2 36.3  MCV 92.7 94.5  PLT 122* 114*   BMP &GFR Recent Labs  Lab 09/19/21 1126 09/20/21 0501  NA 139 140  K 4.3 4.7  CL 109 112*  CO2 21* 19*  GLUCOSE 176* 118*  BUN 32* 35*  CREATININE 2.27* 2.26*  CALCIUM 8.1* 8.0*  MG  --  2.1  PHOS  --  6.0*   Estimated Creatinine Clearance: 23.9 mL/min (A) (by C-G formula based on SCr of 2.26 mg/dL (H)). Liver & Pancreas: Recent Labs  Lab 09/20/21 0501  AST 52*  ALT 64*  ALKPHOS 106  BILITOT 0.7  PROT 5.7*  ALBUMIN 3.4*   No results for input(s): LIPASE, AMYLASE in the last 168 hours. No results for input(s): AMMONIA in the last 168 hours. Diabetic: No results for input(s): HGBA1C in the last 72 hours. Recent Labs  Lab 09/19/21 2136 09/20/21 0726  GLUCAP 170* 114*   Cardiac Enzymes: No results for input(s): CKTOTAL, CKMB, CKMBINDEX, TROPONINI in the last 168 hours. No results for input(s): PROBNP in the last 8760 hours. Coagulation Profile: No results for input(s): INR, PROTIME in the last 168 hours. Thyroid Function Tests: Recent Labs    09/19/21 2034  TSH 2.249   Lipid Profile: No results for input(s): CHOL, HDL, LDLCALC, TRIG, CHOLHDL, LDLDIRECT in the last 72 hours. Anemia Panel: No results for input(s): VITAMINB12, FOLATE, FERRITIN, TIBC, IRON, RETICCTPCT in the last 72 hours. Urine analysis:    Component Value Date/Time   COLORURINE STRAW (A) 09/19/2021 0037   APPEARANCEUR CLEAR 09/19/2021 0037   LABSPEC 1.004 (L) 09/19/2021 0037   PHURINE 5.0 09/19/2021 0037   GLUCOSEU NEGATIVE 09/19/2021 0037   HGBUR SMALL (A) 09/19/2021 0037   BILIRUBINUR NEGATIVE 09/19/2021 0037   BILIRUBINUR neg 07/16/2016 1154    KETONESUR NEGATIVE 09/19/2021 0037   PROTEINUR NEGATIVE 09/19/2021 0037   UROBILINOGEN 1.0 07/16/2016 1154   NITRITE NEGATIVE 09/19/2021 0037   LEUKOCYTESUR NEGATIVE 09/19/2021 0037   Sepsis Labs: Invalid input(s): PROCALCITONIN, [place]  Microbiology: Recent Results (from the past 240 hour(s))  Resp Panel by RT-PCR (Flu A&B, Covid) Nasopharyngeal Swab     Status: None   Collection Time: 09/19/21 11:38 AM   Specimen: Nasopharyngeal Swab; Nasopharyngeal(NP) swabs in vial transport medium  Result Value Ref Range Status   SARS Coronavirus 2 by RT PCR NEGATIVE NEGATIVE Final    Comment: (NOTE) SARS-CoV-2 target nucleic acids are NOT DETECTED.  The SARS-CoV-2 RNA is generally detectable in upper respiratory specimens during the acute phase of infection. The lowest concentration of SARS-CoV-2 viral copies this assay can detect is 138 copies/mL. A negative result does not preclude SARS-Cov-2 infection and should not be used as the sole basis for treatment or other patient management decisions. A negative result may occur with  improper specimen collection/handling, submission of specimen other than nasopharyngeal swab, presence of viral mutation(s) within the areas targeted by this assay, and inadequate number of viral copies(<138 copies/mL). A negative result must be combined with clinical observations, patient history, and epidemiological information. The expected result is Negative.  Fact Sheet for Patients:  EntrepreneurPulse.com.au  Fact Sheet for Healthcare Providers:  IncredibleEmployment.be  This test is no t yet approved or cleared by the [place] FDA and  has been authorized for detection and/or diagnosis of SARS-CoV-2 by FDA under an Emergency Use Authorization (EUA). This EUA will remain  in effect (meaning this test can be used) for the duration of the COVID-19 declaration under Section 564(b)(1) of the Act, 21 U.S.C.section  360bbb-3(b)(1), unless the authorization is terminated  or revoked sooner.       Influenza A by PCR NEGATIVE NEGATIVE Final   Influenza B by PCR NEGATIVE NEGATIVE Final    Comment: (NOTE) The Xpert Xpress SARS-CoV-2/FLU/RSV plus assay is intended as an aid in the diagnosis of influenza from Nasopharyngeal swab specimens and should not be used as a sole basis for treatment. Nasal washings and aspirates are unacceptable for Xpert Xpress SARS-CoV-2/FLU/RSV testing.  Fact Sheet for Patients: EntrepreneurPulse.com.au  Fact Sheet for Healthcare Providers: IncredibleEmployment.be  This test is not yet approved or cleared by the  [place] Architectural technologist and has been authorized for detection and/or diagnosis of SARS-CoV-2 by FDA under an Print production planner (EUA). This EUA will remain in effect (meaning this test can be used) for the duration of the COVID-19 declaration under Section 564(b)(1) of the Act, 21 U.S.C. section 360bbb-3(b)(1), unless the authorization is terminated or revoked.  Performed at [facility], [address] [place], [place] [address]   Culture, blood (Routine X 2) w Reflex to ID Panel     Status: None (Preliminary result)   Collection Time: 09/19/21  8:34 PM   Specimen: Left Antecubital; Blood  Result Value Ref Range Status   Specimen Description   Final    LEFT ANTECUBITAL Performed at [facility] [address], [place], [place] [address]    Special Requests   Final    BOTTLES DRAWN AEROBIC ONLY Blood Culture adequate volume Performed at [facility] [address], [place], [place] [address]    Culture   Final    NO GROWTH < 12 HOURS Performed at [facility] [address], [place], [place] [address]    Report Status PENDING  Incomplete  Culture, blood (Routine X 2) w Reflex to ID Panel     Status: None (Preliminary result)   Collection Time: 09/19/21   8:34 PM   Specimen: BLOOD LEFT HAND  Result Value Ref Range Status   Specimen Description   Final    BLOOD LEFT HAND Performed at [facility] [address] [place], [place] [address]    Special Requests   Final    BOTTLES DRAWN AEROBIC ONLY Blood Culture adequate volume Performed at [facility] [address] [place], [place] [address]    Culture   Final    NO GROWTH < 12 HOURS Performed at [facility] [address], [place], [place] [address]    Report Status PENDING  Incomplete    Radiology Studies: DG Chest 2 View  Result Date: 09/19/2021 CLINICAL DATA:  Cough EXAM: CHEST - 2 VIEW COMPARISON:  Earlier today FINDINGS: Left chest wall pacer device is noted with leads in the right atrial appendage and right ventricle. Stable cardiomediastinal contours. Small bilateral pleural effusions are identified and there is pulmonary vascular congestion. Airspace disease is identified within the right lower lobe. Atelectasis noted in the left lower lobe. Remote healed right posterior 6th rib fracture. IMPRESSION: 1. Suspect right lower lobe pneumonia. 2. Small bilateral pleural effusions and pulmonary vascular congestion. Electronically Signed   By: Kerby Moors M.D.   On: 09/19/2021 14:18   DG Chest Port 1 View  Result Date: 09/19/2021 CLINICAL DATA:  Cough, shortness of breath. EXAM: PORTABLE CHEST 1 VIEW COMPARISON:  March 16, 2016. FINDINGS: Dual lead pacer device in place.  EKG leads project over the chest. Extreme lung bases excluded from view. Signs of bibasilar airspace disease in graded opacities at the RIGHT and LEFT lung base. LEFT hemidiaphragm not imaged. No visible pneumothorax. Visualized cardiomediastinal contours are grossly stable but obscured at the RIGHT lung base due to dense basilar airspace disease. On limited assessment there is no acute skeletal process. IMPRESSION: 1. Signs of bibasilar airspace disease and graded  opacities at the RIGHT and LEFT lung base. Dense basilar opacity on the RIGHT is suspicious for pneumonia and is partially visualized. Airspace disease at the bases likely associated with pleural fluid. 2. Most inferior aspect of the chest, lung bases  excluded from view. Consider repeat imaging of the chest to include the lung bases. Electronically Signed   By: Zetta Bills M.D.   On: 09/19/2021 12:12        Jadin Creque T. [place]  If 7PM-7AM, please contact night-coverage [email or web address] 09/20/2021, 11:26 AM

## 2021-09-20 NOTE — Plan of Care (Signed)
°  Problem: Education: Goal: Knowledge of General Education information will improve Description: Including pain rating scale, medication(s)/side effects and non-pharmacologic comfort measures Outcome: Progressing   Problem: Coping: Goal: Level of anxiety will decrease Outcome: Progressing   Problem: Elimination: Goal: Will not experience complications related to urinary retention Outcome: Progressing   Problem: Pain Managment: Goal: General experience of comfort will improve Outcome: Progressing   Problem: Safety: Goal: Ability to remain free from injury will improve Outcome: Progressing   Problem: Skin Integrity: Goal: Risk for impaired skin integrity will decrease Outcome: Progressing   Problem: Respiratory: Goal: Ability to maintain adequate ventilation will improve Outcome: Progressing Goal: Ability to maintain a clear airway will improve Outcome: Progressing

## 2021-09-20 NOTE — TOC Initial Note (Addendum)
Transition of Care [facility]) - Initial/Assessment Note    Patient Details  Name: Kathryn Buckley MRN: [id number] Date of Birth: 1951/05/30  Transition of Care [facility]) CM/SW Contact:    Dessa Phi, RN Phone Number: 09/20/2021, 2:55 PM  Clinical Narrative:                 Spoke to sister Vonda [place] home 02-has travel tank. HHPT recc-no preference-[employer] rep Tommi Rumps can accept. Has own transport home.  Expected Discharge Plan: [place] Barriers to Discharge: Continued Medical Work up   Patient Goals and CMS Choice Patient states their goals for this hospitalization and ongoing recovery are:: go home CMS Medicare.gov Compare Post Acute Care list provided to:: Patient Represenative (must comment) Choice offered to / list presented to : Sibling  Expected Discharge Plan and Services Expected Discharge Plan: [place]   Discharge Planning Services: CM Consult Post Acute Care Choice: [place] arrangements for the past 2 months: [place]: PT [place]: [place] Date [place]: 09/20/21 Time HH Agency Contacted: 32 Representative spoke with at [place]: Tommi Rumps  Prior Living Arrangements/Services Living arrangements for the past 2 months: [place] with:: Self Patient language and need for interpreter reviewed:: Yes Do you feel safe going back to the place where you live?: Yes          Current home services: DME ([facility]-home 02) Criminal Activity/Legal Involvement Pertinent to Current Situation/Hospitalization: No - Comment as needed  Activities of Daily Living Home Assistive Devices/Equipment: Oxygen, Eyeglasses, Dentures (specify type) (top dentures) ADL Screening (condition at time of admission) Patient's cognitive ability adequate to safely complete daily activities?: Yes Is the patient deaf or have difficulty hearing?: Yes  (slight) Does the patient have difficulty seeing, even when wearing glasses/contacts?: Yes Does the patient have difficulty concentrating, remembering, or making decisions?: Yes Patient able to express need for assistance with ADLs?: Yes Does the patient have difficulty dressing or bathing?: Yes Independently performs ADLs?: No Communication: Independent Dressing (OT): Needs assistance Is this a change from baseline?: Pre-admission baseline Grooming: Independent Feeding: Independent Bathing: Needs assistance Is this a change from baseline?: Pre-admission baseline Toileting: Independent In/Out Bed: Needs assistance Is this a change from baseline?: Pre-admission baseline Walks in Home: Independent (as long as patient is hooked up to oxgen) Does the patient have difficulty walking or climbing stairs?: Yes Weakness of Legs: Both Weakness of Arms/Hands: Both  Permission Sought/Granted Permission sought to share information with : Case Manager Permission granted to share information with : Yes, Verbal Permission Granted  Share Information with NAME: Case Manager     Permission granted to share info w Relationship: Luellen Pucker sister [phone]     Emotional Assessment              Admission diagnosis:  COPD exacerbation ([place]) [J44.1] CAP (community acquired pneumonia) [J18.9] Community acquired pneumonia of right lower lobe of lung [J18.9] Acute on chronic respiratory failure with hypoxia ([place]) [J96.21] Patient Active Problem List   Diagnosis Date Noted   CAP (community acquired pneumonia) 09/19/2021   COPD with acute exacerbation ([place]) 09/19/2021   Acute on chronic respiratory failure with hypoxia ([place]) 09/19/2021   SOB (shortness of breath) 09/19/2021   AKI (acute kidney injury) ([place]) 09/19/2021  Positive colorectal cancer screening using Cologuard test 11/22/2020   Renal insufficiency 10/07/2020   Hyperglycemia 04/30/2017   Seizure disorder ([place]) 04/01/2016   History of  third degree heart block 04/01/2016   Hyperlipidemia 12/14/2014   Bipolar 1 disorder, mixed, moderate ([place]) 08/25/2014   Chronic respiratory failure with hypoxia ([place]) 08/25/2014   COPD (chronic obstructive pulmonary disease) ([place]) 07/03/2014   Basal cell carcinoma of cheek 06/04/2013   Routine general medical examination at a health care facility 10/15/2012   Abnormal EKG 10/15/2012   Hypoxia 10/15/2012   HTN (hypertension) 09/06/2012   Hypothyroid 09/06/2012   GERD (gastroesophageal reflux disease) 09/06/2012   Tobacco abuse 09/06/2012   Depression 11/18/2011   PCP:  Debbrah Alar, NP Pharmacy:   [facility], [address] [place] [address] Phone: [phone] Fax: [phone]     Social Determinants of Health (SDOH) Interventions    Readmission Risk Interventions No flowsheet data found.

## 2021-09-21 ENCOUNTER — Inpatient Hospital Stay (HOSPITAL_COMMUNITY): Payer: Medicare Other

## 2021-09-21 DIAGNOSIS — R079 Chest pain, unspecified: Secondary | ICD-10-CM

## 2021-09-21 DIAGNOSIS — R55 Syncope and collapse: Secondary | ICD-10-CM

## 2021-09-21 DIAGNOSIS — I272 Pulmonary hypertension, unspecified: Secondary | ICD-10-CM

## 2021-09-21 DIAGNOSIS — R112 Nausea with vomiting, unspecified: Secondary | ICD-10-CM

## 2021-09-21 DIAGNOSIS — R1030 Lower abdominal pain, unspecified: Secondary | ICD-10-CM

## 2021-09-21 DIAGNOSIS — I5021 Acute systolic (congestive) heart failure: Secondary | ICD-10-CM

## 2021-09-21 DIAGNOSIS — I952 Hypotension due to drugs: Secondary | ICD-10-CM

## 2021-09-21 LAB — RENAL FUNCTION PANEL
Albumin: 3.5 g/dL (ref 3.5–5.0)
Anion gap: 11 (ref 5–15)
BUN: 49 mg/dL — ABNORMAL HIGH (ref 8–23)
CO2: 19 mmol/L — ABNORMAL LOW (ref 22–32)
Calcium: 7.6 mg/dL — ABNORMAL LOW (ref 8.9–10.3)
Chloride: 108 mmol/L (ref 98–111)
Creatinine, Ser: 2.78 mg/dL — ABNORMAL HIGH (ref 0.44–1.00)
GFR, Estimated: 18 mL/min — ABNORMAL LOW (ref >60)
Glucose, Bld: 168 mg/dL — ABNORMAL HIGH (ref 70–99)
Phosphorus: 6.6 mg/dL — ABNORMAL HIGH (ref 2.5–4.6)
Potassium: 4.2 mmol/L (ref 3.5–5.1)
Sodium: 138 mmol/L (ref 135–145)

## 2021-09-21 LAB — GLUCOSE, CAPILLARY
Glucose-Capillary: 165 mg/dL — ABNORMAL HIGH (ref 70–99)
Glucose-Capillary: 161 mg/dL — ABNORMAL HIGH (ref 70–99)
Glucose-Capillary: 236 mg/dL — ABNORMAL HIGH (ref 70–99)
Glucose-Capillary: 126 mg/dL — ABNORMAL HIGH (ref 70–99)

## 2021-09-21 LAB — CBC
HCT: 37.9 % (ref 36.0–46.0)
HCT: 38.6 % (ref 36.0–46.0)
Hemoglobin: 12 g/dL (ref 12.0–15.0)
Hemoglobin: 11.8 g/dL — ABNORMAL LOW (ref 12.0–15.0)
MCH: 29.6 pg (ref 26.0–34.0)
MCH: 29.6 pg (ref 26.0–34.0)
MCHC: 31.7 g/dL (ref 30.0–36.0)
MCHC: 30.6 g/dL (ref 30.0–36.0)
MCV: 93.6 fL (ref 80.0–100.0)
MCV: 97 fL (ref 80.0–100.0)
Platelets: 125 10*3/uL — ABNORMAL LOW (ref 150–400)
Platelets: 113 10*3/uL — ABNORMAL LOW (ref 150–400)
RBC: 4.05 MIL/uL (ref 3.87–5.11)
RBC: 3.98 MIL/uL (ref 3.87–5.11)
RDW: 17.5 % — ABNORMAL HIGH (ref 11.5–15.5)
RDW: 17.7 % — ABNORMAL HIGH (ref 11.5–15.5)
WBC: 3.4 10*3/uL — ABNORMAL LOW (ref 4.0–10.5)
WBC: 4.4 10*3/uL (ref 4.0–10.5)
nRBC: 1.5 % — ABNORMAL HIGH (ref 0.0–0.2)
nRBC: 1.4 % — ABNORMAL HIGH (ref 0.0–0.2)

## 2021-09-21 LAB — HEPATIC FUNCTION PANEL
ALT: 98 U/L — ABNORMAL HIGH (ref 0–44)
AST: 60 U/L — ABNORMAL HIGH (ref 15–41)
Albumin: 3.5 g/dL (ref 3.5–5.0)
Alkaline Phosphatase: 154 U/L — ABNORMAL HIGH (ref 38–126)
Bilirubin, Direct: <0.1 mg/dL (ref 0.0–0.2)
Total Bilirubin: 0.3 mg/dL (ref 0.3–1.2)
Total Protein: 6.2 g/dL — ABNORMAL LOW (ref 6.5–8.1)

## 2021-09-21 LAB — LACTIC ACID, PLASMA
Lactic Acid, Venous: 3.2 mmol/L (ref 0.5–1.9)
Lactic Acid, Venous: 2 mmol/L (ref 0.5–1.9)

## 2021-09-21 LAB — TROPONIN I (HIGH SENSITIVITY)
Troponin I (High Sensitivity): 70 ng/L — ABNORMAL HIGH (ref <18)
Troponin I (High Sensitivity): 81 ng/L — ABNORMAL HIGH (ref <18)

## 2021-09-21 LAB — MAGNESIUM: Magnesium: 2.2 mg/dL (ref 1.7–2.4)

## 2021-09-21 LAB — HEPATITIS PANEL, ACUTE
HCV Ab: NONREACTIVE
Hep A IgM: NONREACTIVE
Hep B C IgM: NONREACTIVE
Hepatitis B Surface Ag: NONREACTIVE

## 2021-09-21 LAB — PROCALCITONIN: Procalcitonin: 0.3 ng/mL

## 2021-09-21 MED ORDER — SODIUM BICARBONATE 650 MG PO TABS
650.0000 mg | ORAL_TABLET | Freq: Three times a day (TID) | ORAL | Status: DC
Start: 2021-09-21 — End: 2021-09-22
  Administered 2021-09-21 (×2): 650 mg via ORAL
  Filled 2021-09-21 (×2): qty 1

## 2021-09-21 MED ORDER — ONDANSETRON HCL 4 MG/2ML IJ SOLN
4.0000 mg | Freq: Four times a day (QID) | INTRAMUSCULAR | Status: DC | PRN
  Administered 2021-09-21 – 2021-10-04 (×5): 4 mg via INTRAVENOUS
  Filled 2021-09-21 (×5): qty 2

## 2021-09-21 MED ORDER — ISOSORBIDE MONONITRATE ER 30 MG PO TB24
15.0000 mg | ORAL_TABLET | Freq: Every day | ORAL | Status: DC
  Administered 2021-09-21: 12:00:00 15 mg via ORAL
  Filled 2021-09-21: qty 1

## 2021-09-21 MED ORDER — SODIUM CHLORIDE 0.9 % IV BOLUS
500.0000 mL | Freq: Once | INTRAVENOUS | Status: AC
  Administered 2021-09-21: 17:00:00 500 mL via INTRAVENOUS

## 2021-09-21 MED ORDER — HYDRALAZINE HCL 10 MG PO TABS
10.0000 mg | ORAL_TABLET | Freq: Three times a day (TID) | ORAL | Status: DC
  Administered 2021-09-21: 13:00:00 10 mg via ORAL
  Filled 2021-09-21: qty 1

## 2021-09-21 MED ORDER — METHYLPREDNISOLONE SODIUM SUCC 40 MG IJ SOLR
40.0000 mg | Freq: Two times a day (BID) | INTRAMUSCULAR | Status: DC
  Administered 2021-09-21 – 2021-09-28 (×15): 40 mg via INTRAVENOUS
  Filled 2021-09-21 (×14): qty 1

## 2021-09-21 MED ORDER — METOPROLOL TARTRATE 25 MG PO TABS
25.0000 mg | ORAL_TABLET | Freq: Two times a day (BID) | ORAL | Status: DC
  Administered 2021-09-21: 12:00:00 25 mg via ORAL
  Filled 2021-09-21: qty 1

## 2021-09-21 MED ORDER — LORAZEPAM 0.5 MG PO TABS
0.5000 mg | ORAL_TABLET | ORAL | Status: DC | PRN
  Administered 2021-09-21 (×2): 0.5 mg via ORAL
  Filled 2021-09-21 (×2): qty 1

## 2021-09-21 MED ORDER — SODIUM CHLORIDE 0.9 % IV BOLUS
500.0000 mL | Freq: Once | INTRAVENOUS | Status: AC
  Administered 2021-09-21: 19:00:00 500 mL via INTRAVENOUS

## 2021-09-21 MED ORDER — MIDODRINE HCL 5 MG PO TABS
5.0000 mg | ORAL_TABLET | Freq: Three times a day (TID) | ORAL | Status: DC
  Administered 2021-09-22 – 2021-09-25 (×9): 5 mg via ORAL
  Filled 2021-09-21 (×9): qty 1

## 2021-09-21 MED ORDER — LORAZEPAM 0.5 MG PO TABS
0.5000 mg | ORAL_TABLET | Freq: Two times a day (BID) | ORAL | Status: DC | PRN
  Administered 2021-09-22 – 2021-09-24 (×4): 0.5 mg via ORAL
  Filled 2021-09-21 (×4): qty 1

## 2021-09-21 NOTE — Progress Notes (Signed)
PT Cancellation Note  Patient Details Name: Kathryn Buckley MRN: [id number] DOB: 1950/12/07   Cancelled Treatment:    Reason Eval/Treat Not Completed: Medical issues which prohibited therapy Checked on pt and nauseated/vomitting but nursing giving medications.  Returned at later time, but multiple Rns and MD in room , RN reports pt not stable for PT. Will f/u as able. Abran Richard, PT Acute Rehab Services Pager [phone] [name] [phone]   [name] 09/21/2021, 5:34 PM

## 2021-09-21 NOTE — Progress Notes (Signed)
PROGRESS NOTE  Kathryn Buckley PFX:[id number] DOB: 1951/08/15   PCP: Debbrah Alar, NP  Patient is from: Home.  Lives alone.  Independently ambulates at baseline.  DOA: 09/19/2021 LOS: 1  Chief complaints:  Chief Complaint  Patient presents with   Shortness of Breath     Brief Narrative / Interim history: 70 year old F with PMH of COPD/chronic hypoxic RF on 2 L, CKD-3B, CHB/PPM, HTN, hypothyroidism, hyperlipidemia, anxiety, depression and tobacco use disorder (2 PPD) presenting with progressive shortness of breath, productive cough, fever, rigor and myalgia for about 2 to 3 weeks, and admitted for acute on chronic hypoxic respiratory failure in the setting of COPD exacerbation and community-acquired pneumonia, and AKI on CKD-3B.  CXR with RLL infiltrate. Cr 2.3 (baseline 1.5-1.7).  Started on ceftriaxone azithromycin, systemic steroid and breathing treatments.  BNP ordered and elevated to 1600.  TTE with LVEF of 30 to 35%, GH with inferior and inferoseptal akinesis, indeterminate DD, severe LAE, severe RAE and RVSP of 49 mmHg.  Patient was started on IV Lasix but discontinued due to AKI.  Subjective: Seen and examined earlier this morning.  No major events overnight of this morning.  Reports improvement in her breathing.  Denies chest pain, GI or UTI symptoms.  Objective: Vitals:   09/21/21 1146 09/21/21 1331 09/21/21 1350 09/21/21 1422  BP: (!) 109/54 (!) 80/62  101/78  Pulse:  98  99  Resp:    18  Temp:  97.6 F (36.4 C)    TempSrc:  Oral    SpO2:  100% 98% 91%  Weight:      Height:        Examination:  GENERAL: No apparent distress.  Nontoxic. HEENT: MMM.  Vision and hearing grossly intact.  NECK: Supple.  Notable JVD. RESP: 90% on 3 L at rest.  No IWOB.  Fair aeration bilaterally. CVS:  RRR. Heart sounds normal.  ABD/GI/GU: BS+. Abd soft, NTND.  MSK/EXT:  Moves extremities. No apparent deformity. No edema.  SKIN: no apparent skin lesion or wound NEURO:  Awake and alert. Oriented appropriately.  No apparent focal neuro deficit. PSYCH: Calm. Normal affect.   Procedures:  None  Microbiology summarized: [id number] and influenza PCR nonreactive. Blood cultures NGTD.  Assessment & Plan: Acute on chronic respiratory failure with hypoxia due to COPD exacerbation in the setting of RLL pneumonia, acute systolic CHF and pulmonary hypertension: Desaturated to 87% with ambulation on room air requiring 3 L to maintain saturation at 88% with ambulation. -Continue ceftriaxone and azithromycin -Continue IV Solu-Medrol, Xopenex and Atrovent -Incentive spirometry, OOB, PT, OT -Wean oxygen as able.  Daily ambulatory saturation assessment -Encouraged smoking cessation  Acute systolic CHF/pulmonary hypertension: Elevated BNP 1600.  Appears euvolemic except for some JVD.  TTE as above.  Started on IV Lasix and had 2.4 L UOP/24 hours with net negative for 1 L.  However, developed AKI.   -Hold IV Lasix -Monitor fluid and respiratory status -GDMT-start low-dose metoprolol, hydralazine and Imdur.  Renal function precludes ARNI/ARB/ACEi use. -Monitor fluid status, renal functions and electrolytes -I do not think she can undergo heart catheterization in the setting of pneumonia and COPD exacerbation. -Ambulatory referral to cardiology on discharge unless she decompensates  AKI/azotemia on CKD-3B: Cr trended up, likely due to Lasix. Recent Labs    10/06/20 1204 09/19/21 1126 09/20/21 0501 09/21/21 0523  BUN 23 32* 35* 49*  CREATININE 1.73* 2.27* 2.26* 2.78*  -Discontinue Lasix -Recheck renal function in the morning  History of third-degree AVB/PPM  Essential hypertension: Normotensive. -Started low-dose metoprolol, hydralazine and Imdur.  Anxiety and depression: Stable -Continue home medications  Hypothyroidism -Continue home Synthroid  Tobacco use disorder: Currently smokes about 2 pack a day. -Extensively discussed the importance of smoking  cessation -Nicotine patch  None anion gap metabolic acidosis: Likely due to AKI. -Start p.o. sodium bicarbonate  Hyperphosphatemia: Likely due to renal failure.  Elevated liver enzymes: Could be due to statin or congestive hepatopathy.  Acute hepatitis panel negative. -Continue monitoring  Thrombocytopenia/leukopenia: Could be due to pneumonia.  Improved. -Continue monitoring  Body mass index is 24.91 kg/m.         DVT prophylaxis:  SCDs Start: 09/19/21 1924  Code Status: Full code Family Communication: Patient and/or RN. Available if any question.  Level of care: Telemetry Status is: Inpatient  The patient will remain inpatient because: Acute on chronic respiratory failure with hypoxia due to COPD exacerbation and pneumonia requiring IV antibiotics and IV steroid, and acute CHF   Final disposition: Likely home once medically stable.   Consultants:  None   Sch Meds:  Scheduled Meds:  atorvastatin  40 mg Oral Daily   carbamazepine  100 mg Oral BID   FLUoxetine  40 mg Oral Daily   hydrALAZINE  10 mg Oral Q8H   insulin aspart  0-9 Units Subcutaneous TID WC   ipratropium  0.5 mg Nebulization TID   isosorbide mononitrate  15 mg Oral Daily   levalbuterol  1.25 mg Nebulization TID   levothyroxine  125 mcg Oral Q0600   mouth rinse  15 mL Mouth Rinse BID   methylPREDNISolone (SOLU-MEDROL) injection  80 mg Intravenous Q12H   metoprolol tartrate  25 mg Oral BID   nicotine  21 mg Transdermal Daily   pantoprazole  40 mg Oral Daily   QUEtiapine  300 mg Oral QHS   Continuous Infusions:  sodium chloride Stopped (09/19/21 1532)   azithromycin Stopped (09/20/21 1602)   cefTRIAXone (ROCEPHIN)  IV Stopped (09/20/21 1413)   PRN Meds:.sodium chloride, acetaminophen **OR** acetaminophen, benzonatate, levalbuterol, LORazepam, sodium chloride  Antimicrobials: Anti-infectives (From admission, onward)    Start     Dose/Rate Route Frequency Ordered Stop   09/20/21 1600   azithromycin (ZITHROMAX) 500 mg in sodium chloride 0.9 % 250 mL IVPB        500 mg 250 mL/hr over 60 Minutes Intravenous Every 24 hours 09/19/21 1925     09/20/21 1500  cefTRIAXone (ROCEPHIN) 1 g in sodium chloride 0.9 % 100 mL IVPB        1 g 200 mL/hr over 30 Minutes Intravenous Every 24 hours 09/19/21 1925     09/19/21 1515  cefTRIAXone (ROCEPHIN) 1 g in sodium chloride 0.9 % 100 mL IVPB        1 g 200 mL/hr over 30 Minutes Intravenous  Once 09/19/21 1503 09/19/21 1602   09/19/21 1515  azithromycin (ZITHROMAX) 500 mg in sodium chloride 0.9 % 250 mL IVPB        500 mg 250 mL/hr over 60 Minutes Intravenous  Once 09/19/21 1503 09/19/21 1746        I have personally reviewed the following labs and images: CBC: Recent Labs  Lab 09/19/21 1126 09/20/21 0501 09/21/21 0523  WBC 4.5 2.7* 3.4*  NEUTROABS 3.1 1.8  --   HGB 12.3 11.2* 12.0  HCT 39.2 36.3 37.9  MCV 92.7 94.5 93.6  PLT 122* 114* 125*   BMP &GFR Recent Labs  Lab 09/19/21 1126 09/20/21 0501 09/21/21 0523  NA 139 140 138  K 4.3 4.7 4.2  CL 109 112* 108  CO2 21* 19* 19*  GLUCOSE 176* 118* 168*  BUN 32* 35* 49*  CREATININE 2.27* 2.26* 2.78*  CALCIUM 8.1* 8.0* 7.6*  MG  --  2.1 2.2  PHOS  --  6.0* 6.6*   Estimated Creatinine Clearance: 19.4 mL/min (A) (by C-G formula based on SCr of 2.78 mg/dL (H)). Liver & Pancreas: Recent Labs  Lab 09/20/21 0501 09/21/21 0523  AST 52* 60*  ALT 64* 98*  ALKPHOS 106 154*  BILITOT 0.7 0.3  PROT 5.7* 6.2*  ALBUMIN 3.4* 3.5   3.5   No results for input(s): LIPASE, AMYLASE in the last 168 hours. No results for input(s): AMMONIA in the last 168 hours. Diabetic: No results for input(s): HGBA1C in the last 72 hours. Recent Labs  Lab 09/20/21 1134 09/20/21 1634 09/20/21 2045 09/21/21 0753 09/21/21 1202  GLUCAP 175* 324* 126* 165* 161*   Cardiac Enzymes: No results for input(s): CKTOTAL, CKMB, CKMBINDEX, TROPONINI in the last 168 hours. No results for input(s): PROBNP  in the last 8760 hours. Coagulation Profile: No results for input(s): INR, PROTIME in the last 168 hours. Thyroid Function Tests: Recent Labs    09/19/21 2034  TSH 2.249   Lipid Profile: No results for input(s): CHOL, HDL, LDLCALC, TRIG, CHOLHDL, LDLDIRECT in the last 72 hours. Anemia Panel: No results for input(s): VITAMINB12, FOLATE, FERRITIN, TIBC, IRON, RETICCTPCT in the last 72 hours. Urine analysis:    Component Value Date/Time   COLORURINE STRAW (A) 09/19/2021 0037   APPEARANCEUR CLEAR 09/19/2021 0037   LABSPEC 1.004 (L) 09/19/2021 0037   PHURINE 5.0 09/19/2021 0037   GLUCOSEU NEGATIVE 09/19/2021 0037   HGBUR SMALL (A) 09/19/2021 0037   BILIRUBINUR NEGATIVE 09/19/2021 0037   BILIRUBINUR neg 07/16/2016 1154   KETONESUR NEGATIVE 09/19/2021 0037   PROTEINUR NEGATIVE 09/19/2021 0037   UROBILINOGEN 1.0 07/16/2016 1154   NITRITE NEGATIVE 09/19/2021 0037   LEUKOCYTESUR NEGATIVE 09/19/2021 0037   Sepsis Labs: Invalid input(s): PROCALCITONIN, [place]  Microbiology: Recent Results (from the past 240 hour(s))  Resp Panel by RT-PCR (Flu A&B, Covid) Nasopharyngeal Swab     Status: None   Collection Time: 09/19/21 11:38 AM   Specimen: Nasopharyngeal Swab; Nasopharyngeal(NP) swabs in vial transport medium  Result Value Ref Range Status   SARS Coronavirus 2 by RT PCR NEGATIVE NEGATIVE Final    Comment: (NOTE) SARS-CoV-2 target nucleic acids are NOT DETECTED.  The SARS-CoV-2 RNA is generally detectable in upper respiratory specimens during the acute phase of infection. The lowest concentration of SARS-CoV-2 viral copies this assay can detect is 138 copies/mL. A negative result does not preclude SARS-Cov-2 infection and should not be used as the sole basis for treatment or other patient management decisions. A negative result may occur with  improper specimen collection/handling, submission of specimen other than nasopharyngeal swab, presence of viral mutation(s) within  the areas targeted by this assay, and inadequate number of viral copies(<138 copies/mL). A negative result must be combined with clinical observations, patient history, and epidemiological information. The expected result is Negative.  Fact Sheet for Patients:  EntrepreneurPulse.com.au  Fact Sheet for Healthcare Providers:  IncredibleEmployment.be  This test is no t yet approved or cleared by the [place] FDA and  has been authorized for detection and/or diagnosis of SARS-CoV-2 by FDA under an Emergency Use Authorization (EUA). This EUA will remain  in effect (meaning this test can be used) for the duration of the  COVID-19 declaration under Section 564(b)(1) of the Act, 21 U.S.C.section 360bbb-3(b)(1), unless the authorization is terminated  or revoked sooner.       Influenza A by PCR NEGATIVE NEGATIVE Final   Influenza B by PCR NEGATIVE NEGATIVE Final    Comment: (NOTE) The Xpert Xpress SARS-CoV-2/FLU/RSV plus assay is intended as an aid in the diagnosis of influenza from Nasopharyngeal swab specimens and should not be used as a sole basis for treatment. Nasal washings and aspirates are unacceptable for Xpert Xpress SARS-CoV-2/FLU/RSV testing.  Fact Sheet for Patients: EntrepreneurPulse.com.au  Fact Sheet for Healthcare Providers: IncredibleEmployment.be  This test is not yet approved or cleared by the [place] FDA and has been authorized for detection and/or diagnosis of SARS-CoV-2 by FDA under an Emergency Use Authorization (EUA). This EUA will remain in effect (meaning this test can be used) for the duration of the COVID-19 declaration under Section 564(b)(1) of the Act, 21 U.S.C. section 360bbb-3(b)(1), unless the authorization is terminated or revoked.  Performed at [facility], [address] [place], [place] [address]   Culture, blood (Routine X 2) w Reflex to ID Panel      Status: None (Preliminary result)   Collection Time: 09/19/21  8:34 PM   Specimen: Left Antecubital; Blood  Result Value Ref Range Status   Specimen Description   Final    LEFT ANTECUBITAL Performed at [facility] [address] [place], [place] [address]    Special Requests   Final    BOTTLES DRAWN AEROBIC ONLY Blood Culture adequate volume Performed at [facility] [address], [place], [place] [address]    Culture   Final    NO GROWTH 1 DAY Performed at [facility], [place] [address], [place], [place] [address]    Report Status PENDING  Incomplete  Culture, blood (Routine X 2) w Reflex to ID Panel     Status: None (Preliminary result)   Collection Time: 09/19/21  8:34 PM   Specimen: BLOOD LEFT HAND  Result Value Ref Range Status   Specimen Description   Final    BLOOD LEFT HAND Performed at [facility] [address], [place], [place] [address]    Special Requests   Final    BOTTLES DRAWN AEROBIC ONLY Blood Culture adequate volume Performed at [facility] [address] [place], [place] [address]    Culture   Final    NO GROWTH 1 DAY Performed at [facility], [place] [address], [place], [place] [address]    Report Status PENDING  Incomplete    Radiology Studies: No results found.      Jasslyn Finkel T. [place]  If 7PM-7AM, please contact night-coverage [email or web address] 09/21/2021, 2:26 PM

## 2021-09-21 NOTE — Progress Notes (Signed)
BPs 80/60s in both arms after 540ml bolus.  No change in respiratory status.  MD notified.  New orders received.

## 2021-09-21 NOTE — Progress Notes (Signed)
Subjective Rapid response activated due to hypotension to 70s/50s.  Patient was somewhat lethargic.  She was complaining of chest pain that has resolved.  Now complaining of nausea, weakness and cramping pain across lower abdomen.  Feels like going to to have bowel movements.  Chest pain resolved on my arrival.  Does not seem to be in respiratory distress.  Twelve-lead EKG without acute ischemic finding or significant abnormal intervals.  Patient was started on low-dose metoprolol, hydralazine and Imdur early in the morning for acute systolic CHF.  Objective Vitals:   09/21/21 1350 09/21/21 1422 09/21/21 1707 09/21/21 1730  BP:  101/78 (!) 72/61 (!) 71/52  Pulse:  99 63 (!) 59  Resp:  18    Temp:      TempSrc:      SpO2: 98% 91% 97% 94%  Weight:      Height:        GENERAL: Somewhat lethargic.  Appropriately oriented. HEENT: MMM.  Vision and hearing grossly intact.  NECK: Supple.  No apparent JVD.  RESP: 94% on 3 L.  No IWOB.  Fair aeration bilaterally. CVS:  RRR. Heart sounds normal.  ABD/GI/GU: BS+. Abd soft, NTND.  MSK/EXT:  Moves extremities. No apparent deformity.  Faint DP and radial pulses but symmetric. SKIN: no apparent skin lesion or wound NEURO: Awake.  Oriented appropriately.  No apparent focal neuro deficit. PSYCH: Calm. Normal affect.   Assessment and plan Hypotension/near syncope-likely from antihypertensive meds.  EKG personally reviewed showed AV paced rhythm with AV conduction delay and JP elevation in anterior leads. -Discontinued antihypertensive meds. -Check troponin, CBC and lactic acid -IV NS bolus 500 cc at 250 cc an hour.  Will reassess for additional boluses. -Portable CXR  Nausea/abdominal cramping: Likely due to the above. -Antiemetics -IV fluid as above  Chest pain-resolved.  EKG as above. -Check troponin-expect mild elevation in the setting of CKD/AKI -Portable CXR  Goal of care counseling: Patient stated that she does not want resuscitation, CPR  and intubation.  She is awake and well-oriented. -Changed CODE STATUS to DNR/DNI  Family communication: Attempted to call patient's sister, Luellen Pucker with patient's permission but no answer.  Did not leave voicemail.  No other family member listed in her chart.  CRITICAL CARE Performed by: Mercy Riding   Total critical care time: 35 minutes  Critical care time was exclusive of separately billable procedures and treating other patients.  Critical care was necessary to treat or prevent imminent or life-threatening deterioration.  Critical care was time spent personally by me on the following activities: development of treatment plan with patient and/or surrogate as well as nursing, discussions with consultants, evaluation of patient's response to treatment, examination of patient, obtaining history from patient or surrogate, ordering and performing treatments and interventions, ordering and review of laboratory studies, ordering and review of radiographic studies, pulse oximetry and re-evaluation of patient's condition.

## 2021-09-21 NOTE — Progress Notes (Signed)
Pt complaining of nausea.  New order for IV Zofran.  Pt continue to complain of nausea and new onset chest pain 8/10.  EKG obtained.  Vitals obtained.  MD notified.  Rapid called and at bedside.  MD at bedside with new orders.

## 2021-09-21 NOTE — Progress Notes (Signed)
Date and time results received: 09/21/21 18:53  Test: Lactic Acid Critical Value: 3.2  Name of Provider Notified: Cyndia Skeeters

## 2021-09-21 NOTE — Evaluation (Signed)
Occupational Therapy Evaluation Patient Details Name: Kathryn Buckley MRN: [id number] DOB: 1951-03-02 Today's Date: 09/21/2021   History of Present Illness 70 year old female presenting with progressive shortness of breath, productive cough, fever, rigor and myalgia for about 2 to 3 weeks and admitted 09/19/21 for acute on chronic hypoxic respiratory failure in the setting of COPD exacerbation and community-acquired pneumonia, and AKI on CKD-3B.  PMH of COPD/chronic hypoxic RF on 2 L, CKD-3B, CHB/PPM, HTN, hypothyroidism, hyperlipidemia, anxiety, depression and tobacco use disorder (2 PPD)   Clinical Impression   Pt was functioning modified independently in her apartment, where she lives alone with intermittent assistance of her family, and using 2L 02. Pt reports she had gotten so sick she could barely prepare a light meal and was having to sponge bathe. Pt presents with generalized weakness, decreased activity tolerance and impaired standing balance. She needs up to min guard assist for ADL and OOB mobility. Pt with Sp02 down to 87% on 2L with toileting, but rebounded to 90% once back at rest. Encouraged OOB and use of IS. Will follow acutely.      Recommendations for follow up therapy are one component of a multi-disciplinary discharge planning process, led by the attending physician.  Recommendations may be updated based on patient status, additional functional criteria and insurance authorization.   Follow Up Recommendations  Home health OT    Assistance Recommended at Discharge Intermittent Supervision/Assistance  Functional Status Assessment  Patient has had a recent decline in their functional status and demonstrates the ability to make significant improvements in function in a reasonable and predictable amount of time.  Equipment Recommendations  None recommended by OT    Recommendations for Other Services       Precautions / Restrictions Precautions Precautions:  Fall Precaution Comments: monitor sats      Mobility Bed Mobility Overal bed mobility: Modified Independent                  Transfers Overall transfer level: Needs assistance Equipment used: None Transfers: Bed to chair/wheelchair/BSC Sit to Stand: Supervision Stand pivot transfers: Supervision                Balance Overall balance assessment: Mild deficits observed, not formally tested                                         ADL either performed or assessed with clinical judgement   ADL Overall ADL's : Needs assistance/impaired Eating/Feeding: Independent   Grooming: Set up;Sitting;Wash/dry hands   Upper Body Bathing: Set up;Sitting   Lower Body Bathing: Supervison/ safety;Sit to/from stand   Upper Body Dressing : Set up;Sitting   Lower Body Dressing: Supervision/safety;Sit to/from stand   Toilet Transfer: Supervision/safety;Stand-pivot;BSC/3in1   Toileting- Water quality scientist and Hygiene: Supervision/safety;Sit to/from stand               Vision Baseline Vision/History: 1 Wears glasses Ability to See in Adequate Light: 0 Adequate Patient Visual Report: No change from baseline       Perception     Praxis      Pertinent Vitals/Pain Pain Assessment: Faces Faces Pain Scale: Hurts little more Pain Location: back Pain Descriptors / Indicators: Aching Pain Intervention(s): Monitored during session;Repositioned     Hand Dominance Right   Extremity/Trunk Assessment Upper Extremity Assessment Upper Extremity Assessment: Overall WFL for tasks assessed   Lower Extremity Assessment Lower Extremity  Assessment: Defer to PT evaluation   Cervical / Trunk Assessment Cervical / Trunk Assessment: Normal   Communication Communication Communication: No difficulties   Cognition Arousal/Alertness: Awake/alert Behavior During Therapy: Flat affect Overall Cognitive Status: Within Functional Limits for tasks assessed                                        General Comments       Exercises     Shoulder Instructions      Home Living Family/patient expects to be discharged to:: Private residence Living Arrangements: Alone Available Help at Discharge: Family;Available PRN/intermittently Type of Home: Apartment Home Access: Level entry     Home Layout: One level     Bathroom Shower/Tub: Teacher, early years/pre: Standard     Home Equipment: Conservation officer, nature (2 wheels);Cane - single point;Wheelchair - Biomedical scientist Comments: baseline 2L O2 [place], reports apartment is replacing with taller toilet      Prior Functioning/Environment Prior Level of Function : Independent/Modified Independent             Mobility Comments: pt reports ambulating short distance and taking frequent rest breaks for the past couple weeks, family assists with IADLs and driving, reports family just got her a shower seat          OT Problem List: Decreased activity tolerance;Impaired balance (sitting and/or standing);Decreased knowledge of use of DME or AE;Cardiopulmonary status limiting activity      OT Treatment/Interventions: Self-care/ADL training;Energy conservation;DME and/or AE instruction;Balance training;Therapeutic activities    OT Goals(Current goals can be found in the care plan section) Acute Rehab OT Goals OT Goal Formulation: With patient Time For Goal Achievement: 10/05/21 Potential to Achieve Goals: Good ADL Goals Pt Will Perform Grooming: with modified independence;standing Pt Will Perform Lower Body Bathing: with modified independence;sit to/from stand Pt Will Perform Lower Body Dressing: with modified independence;sit to/from stand Pt Will Transfer to Toilet: with modified independence;ambulating;regular height toilet Pt Will Perform Toileting - Clothing Manipulation and hygiene: with modified independence;sit to/from stand Additional ADL Goal #1: Pt will  state at least 3 energy conservation strategies as instructed.  OT Frequency: Min 2X/week   Barriers to D/C:            Co-evaluation              AM-PAC OT "6 Clicks" Daily Activity     Outcome Measure Help from another person eating meals?: None Help from another person taking care of personal grooming?: A Little Help from another person toileting, which includes using toliet, bedpan, or urinal?: A Little Help from another person bathing (including washing, rinsing, drying)?: A Little Help from another person to put on and taking off regular upper body clothing?: None Help from another person to put on and taking off regular lower body clothing?: A Little 6 Click Score: 20   End of Session Equipment Utilized During Treatment: Oxygen (2L)  Activity Tolerance: Patient tolerated treatment well Patient left: in bed;with call bell/phone within reach;with bed alarm set  OT Visit Diagnosis: Unsteadiness on feet (R26.81);Other (comment) (decreased activity tolerance)                Time: 1601-0932 OT Time Calculation (min): 33 min Charges:  OT General Charges $OT Visit: 1 Visit OT Evaluation $OT Eval Moderate Complexity: 1 Mod OT Treatments $Self Care/Home Management : 8-22 mins  Nestor Lewandowsky, OTR/L Acute Rehabilitation Services Pager: [phone] Office: [phone]   Malka So 09/21/2021, 10:00 AM

## 2021-09-21 NOTE — Significant Event (Addendum)
Rapid Response Event Note   Reason for Call : Notified by 4th floor Charge RN that patient was having chest pain and hypotensive. Upon arrival, patient was slightly drowsy but oriented x 4, able to follow commands. MD Cyndia Skeeters notified and at beside. Ordered to start 500 cc bolus for hypotension. IV access was limited. MD Gonfa ordered to place 20 G IV in leg.    Initial Focused Assessment:  Neuro: Oriented x 4 and able to follow commands  Cardiac: SB to SR: HR 50s-60s, BP Hypotensive-BP Systolic 35D BP at 9741 was 73/61  Pulmonary: O2 96% on 3L    Interventions:  EKG completed  IV 20 G in leg started  500 cc bolus  Lactic acid and CBC ordered per MD [place] of Care:  Patient is tolerating fluid bolus and is responding. Patient more alert and less drowsy. Hold off sedative medication for now. Patient is okay to remain on 4th floor progressive for now. BP 112/ 76 (89). If patient's BP does not rebound fully from IV fluids, may need additional bolus and transfer to SDU for closer monitoring.    Event Summary:   MD Notified: MD Cyndia Skeeters  Call Time: 6384 Arrival Time: 5364 End Time: [place], RN

## 2021-09-21 NOTE — Progress Notes (Signed)
Oxygen Saturation while ambulating   Saturation at rest= 90% Saturation with ambulation= 87% Minimum oxygen needed to keep saturation above 88% with ambulation= 3L

## 2021-09-21 NOTE — Progress Notes (Signed)
°   09/21/21 1707  Assess: MEWS Score  BP (!) 72/61  Pulse Rate 63  SpO2 97 %  O2 Device Nasal Cannula  O2 Flow Rate (L/min) 3 L/min  Assess: MEWS Score  MEWS Temp 0  MEWS Systolic 2  MEWS Pulse 0  MEWS RR 0  MEWS LOC 0  MEWS Score 2  MEWS Score Color Yellow  Assess: if the MEWS score is Yellow or Red  Were vital signs taken at a resting state? Yes  Focused Assessment Change from prior assessment (see assessment flowsheet)  Does the patient meet 2 or more of the SIRS criteria? No  MEWS guidelines implemented *See Row Information* Yes  Treat  MEWS Interventions Escalated (See documentation below)  Pain Scale 0-10  Pain Score 8  Pain Type Acute pain  Pain Location Chest  Pain Orientation Left  Pain Descriptors / Indicators Cramping  Pain Onset Sudden  Pain Intervention(s) MD notified (Comment)  Take Vital Signs  Increase Vital Sign Frequency  Yellow: Q 2hr X 2 then Q 4hr X 2, if remains yellow, continue Q 4hrs  Escalate  MEWS: Escalate Yellow: discuss with charge nurse/RN and consider discussing with provider and RRT  Notify: Charge Nurse/RN  Name of Charge Nurse/RN Notified Janett Billow  Date Charge Nurse/RN Notified 09/21/21  Time Charge Nurse/RN Notified 1707  Notify: Provider  Provider Name/Title Cyndia Skeeters  Date Provider Notified 09/21/21  Time Provider Notified 1700  Notification Type Page  Notification Reason Change in status  Provider response At bedside  Date of Provider Response 09/21/21  Time of Provider Response 1702  Notify: Rapid Response  Name of Rapid Response RN Notified Christian  Date Rapid Response Notified 09/21/21  Time Rapid Response Notified 7902  Document  Patient Outcome Stabilized after interventions  Progress note created (see row info) Yes  Assess: SIRS CRITERIA  SIRS Temperature  0  SIRS Pulse 0  SIRS Respirations  0  SIRS WBC 0  SIRS Score Sum  0

## 2021-09-21 NOTE — Progress Notes (Signed)
Nutrition Brief Note  Patient identified on the Malnutrition Screening Tool (MST) Report  Wt Readings from Last 15 Encounters:  09/21/21 76.5 kg  07/18/21 71.7 kg  02/20/21 69.9 kg  10/06/20 68.5 kg  05/25/19 72.1 kg  05/04/18 74 kg  12/10/17 74.8 kg  04/30/17 75.7 kg  10/02/16 77.6 kg  04/01/16 85 kg  03/01/16 84.6 kg  12/14/14 83.4 kg  08/25/14 83.9 kg  06/29/14 86.5 kg  12/31/13 84.9 kg    Body mass index is 24.91 kg/m. Patient meets criteria for normal weight/borderline overweight based on current BMI. Skin WDL.  Patient presented to the ED due to shortness of breath. She was dx with CAP.   Current diet order is Regular and patient is consuming approximately 50-100% of meals at this time. Labs and medications reviewed.   No nutrition interventions warranted at this time. If nutrition issues arise, please consult RD.      Jarome Matin, MS, RD, LDN, CNSC Inpatient Clinical Dietitian RD pager # available in [place]  After hours/weekend pager # available in [facility]

## 2021-09-22 DIAGNOSIS — G9341 Metabolic encephalopathy: Secondary | ICD-10-CM

## 2021-09-22 DIAGNOSIS — I959 Hypotension, unspecified: Secondary | ICD-10-CM

## 2021-09-22 DIAGNOSIS — E872 Acidosis, unspecified: Secondary | ICD-10-CM

## 2021-09-22 LAB — MAGNESIUM: Magnesium: 2 mg/dL (ref 1.7–2.4)

## 2021-09-22 LAB — GLUCOSE, CAPILLARY
Glucose-Capillary: 134 mg/dL — ABNORMAL HIGH (ref 70–99)
Glucose-Capillary: 113 mg/dL — ABNORMAL HIGH (ref 70–99)
Glucose-Capillary: 138 mg/dL — ABNORMAL HIGH (ref 70–99)
Glucose-Capillary: 170 mg/dL — ABNORMAL HIGH (ref 70–99)

## 2021-09-22 LAB — COMPREHENSIVE METABOLIC PANEL
ALT: 364 U/L — ABNORMAL HIGH (ref 0–44)
AST: 369 U/L — ABNORMAL HIGH (ref 15–41)
Albumin: 3.3 g/dL — ABNORMAL LOW (ref 3.5–5.0)
Alkaline Phosphatase: 161 U/L — ABNORMAL HIGH (ref 38–126)
Anion gap: 9 (ref 5–15)
BUN: 63 mg/dL — ABNORMAL HIGH (ref 8–23)
CO2: 19 mmol/L — ABNORMAL LOW (ref 22–32)
Calcium: 7 mg/dL — ABNORMAL LOW (ref 8.9–10.3)
Chloride: 109 mmol/L (ref 98–111)
Creatinine, Ser: 3.07 mg/dL — ABNORMAL HIGH (ref 0.44–1.00)
GFR, Estimated: 16 mL/min — ABNORMAL LOW (ref >60)
Glucose, Bld: 162 mg/dL — ABNORMAL HIGH (ref 70–99)
Potassium: 5 mmol/L (ref 3.5–5.1)
Sodium: 137 mmol/L (ref 135–145)
Total Bilirubin: 0.5 mg/dL (ref 0.3–1.2)
Total Protein: 5.6 g/dL — ABNORMAL LOW (ref 6.5–8.1)

## 2021-09-22 LAB — BLOOD GAS, ARTERIAL
Acid-base deficit: 10.6 mmol/L — ABNORMAL HIGH (ref 0.0–2.0)
Bicarbonate: 17.3 mmol/L — ABNORMAL LOW (ref 20.0–28.0)
O2 Saturation: 87.6 %
Patient temperature: 98.6
pCO2 arterial: 48.4 mmHg — ABNORMAL HIGH (ref 32.0–48.0)
pH, Arterial: 7.179 — CL (ref 7.350–7.450)
pO2, Arterial: 70 mmHg — ABNORMAL LOW (ref 83.0–108.0)

## 2021-09-22 LAB — CK: Total CK: 104 U/L (ref 38–234)

## 2021-09-22 LAB — CBC
HCT: 40.5 % (ref 36.0–46.0)
Hemoglobin: 12.2 g/dL (ref 12.0–15.0)
MCH: 28.8 pg (ref 26.0–34.0)
MCHC: 30.1 g/dL (ref 30.0–36.0)
MCV: 95.5 fL (ref 80.0–100.0)
Platelets: 108 10*3/uL — ABNORMAL LOW (ref 150–400)
RBC: 4.24 MIL/uL (ref 3.87–5.11)
RDW: 17.5 % — ABNORMAL HIGH (ref 11.5–15.5)
WBC: 4.8 10*3/uL (ref 4.0–10.5)
nRBC: 2.5 % — ABNORMAL HIGH (ref 0.0–0.2)

## 2021-09-22 LAB — PHOSPHORUS: Phosphorus: 7.3 mg/dL — ABNORMAL HIGH (ref 2.5–4.6)

## 2021-09-22 LAB — PROCALCITONIN: Procalcitonin: 0.29 ng/mL

## 2021-09-22 LAB — AMMONIA: Ammonia: 60 umol/L — ABNORMAL HIGH (ref 9–35)

## 2021-09-22 MED ORDER — SODIUM BICARBONATE 650 MG PO TABS
650.0000 mg | ORAL_TABLET | Freq: Three times a day (TID) | ORAL | Status: DC
  Administered 2021-09-22 – 2021-09-25 (×9): 650 mg via ORAL
  Filled 2021-09-22 (×9): qty 1

## 2021-09-22 MED ORDER — QUETIAPINE FUMARATE 100 MG PO TABS
100.0000 mg | ORAL_TABLET | Freq: Every day | ORAL | Status: DC
  Administered 2021-09-22 – 2021-10-03 (×12): 100 mg via ORAL
  Filled 2021-09-22 (×12): qty 1

## 2021-09-22 MED ORDER — LACTULOSE 10 GM/15ML PO SOLN
30.0000 g | Freq: Three times a day (TID) | ORAL | Status: DC
Start: 2021-09-22 — End: 2021-09-26
  Administered 2021-09-22 – 2021-09-25 (×11): 30 g via ORAL
  Filled 2021-09-22 (×12): qty 45

## 2021-09-22 MED ORDER — CHLORHEXIDINE GLUCONATE CLOTH 2 % EX PADS
6.0000 | MEDICATED_PAD | Freq: Every day | CUTANEOUS | Status: DC
  Administered 2021-09-22 – 2021-10-04 (×10): 6 via TOPICAL

## 2021-09-22 MED ORDER — CHLORHEXIDINE GLUCONATE 0.12 % MT SOLN
15.0000 mL | Freq: Two times a day (BID) | OROMUCOSAL | Status: DC
  Administered 2021-09-23 – 2021-10-03 (×18): 15 mL via OROMUCOSAL
  Filled 2021-09-22 (×19): qty 15

## 2021-09-22 MED ORDER — SODIUM CHLORIDE 0.9 % IV BOLUS
500.0000 mL | Freq: Once | INTRAVENOUS | Status: AC
  Administered 2021-09-22: 500 mL via INTRAVENOUS

## 2021-09-22 MED ORDER — STERILE WATER FOR INJECTION IV SOLN
INTRAVENOUS | Status: DC
  Filled 2021-09-22: qty 1000
  Filled 2021-09-22: qty 150
  Filled 2021-09-22: qty 1000

## 2021-09-22 MED ORDER — LACTULOSE ENEMA
300.0000 mL | Freq: Three times a day (TID) | ORAL | Status: DC
  Filled 2021-09-22 (×14): qty 300

## 2021-09-22 NOTE — Progress Notes (Signed)
MD Cyndia Skeeters made aware of patient's critical ABG pH result of 7.19. Pt alert, moaning at this time but drifts off to sleep often. Alert to self and knew she was at [facility], but also talking about a "rabbit" in the hallway. Pt states she feels "scared" but reassured by RN that she is in a safe place and we are doing all we can to care for her. O2 Sats currently 90% on 1L/[place]. BP 95/71, HR 60, RR 18. FC placed as ordered with no complications, clear yellow urine returned. Mittens placed on patient's hands for safety after she removed her [place]. Progressive MX monitor in place at bedside for frequent monitoring of VS. Will continue to monitor closely.

## 2021-09-22 NOTE — Progress Notes (Signed)
Patient combative, continuously pulling off BiPAP mask. Mittens in place but pt still ripping at PIVs and BiPAP. PIV to left upper arm infiltrated, 24G to right wrist leaking. Awaiting placement of new PIV by IV team now. Pt currently back on 1L/[place] with O2  Sat 90%. Will make MD Gonfa aware.

## 2021-09-22 NOTE — Progress Notes (Signed)
IV team unable to get PIV at this time, will attempt again when able. MD Cyndia Skeeters made aware- gave order to give PO sodium bicarb and allow PO sips until able to get IV line. Pt much more alert at this time, able to tolerate PO crushed in AS. Will continue to monitor.

## 2021-09-22 NOTE — Plan of Care (Signed)
  Problem: Clinical Measurements: Goal: Ability to maintain clinical measurements within normal limits will improve Outcome: Progressing   

## 2021-09-22 NOTE — Progress Notes (Signed)
Rapid Response Event Note   Reason for Call : pt b/p  (70's/30's)   Initial Focused Assessment:  pt easily aroused, see Flowsheet for VS.  Following commands and receiving one of several boluses per [facility], NP orders.  Pt received   Interventions: IV Team placing an IV due to pt having limited access.  Pt had not voided during the night bladder scan revealed moderate amount of urine.  Pt I/O cath via Primary, RN with urine return of 600cc.  Golden yellow in color.   Plan of Care:  Pt will remain in current location.  But, may need to transfer in future if continues to have low b/p issues.  As of now pt b/p is responding to bolus of NS.    Event Summary:   MD Notified: yes (Primary, RN) Call Time: 0234  Arrival Time: 7903 End Time: 0300  Dyann Ruddle, RN

## 2021-09-22 NOTE — Progress Notes (Signed)
PROGRESS NOTE  Kathryn Buckley RWE:[id number] DOB: [birth year]   PCP: Debbrah Alar, NP  Patient is from: Home.  Lives alone.  Independently ambulates at baseline.  DOA: 09/19/2021 LOS: 2  Chief complaints:  Chief Complaint  Patient presents with   Shortness of Breath     Brief Narrative / Interim history: 70 year old F with PMH of COPD/chronic hypoxic RF on 2 L, CKD-3B, CHB/PPM, HTN, hypothyroidism, hyperlipidemia, anxiety, depression and tobacco use disorder (2 PPD) presenting with progressive shortness of breath, productive cough, fever, rigor and myalgia for about 2 to 3 weeks, and admitted for acute on chronic hypoxic respiratory failure in the setting of COPD exacerbation and community-acquired pneumonia, and AKI on CKD-3B.  CXR with RLL infiltrate. Cr 2.3 (baseline 1.5-1.7).  Started on ceftriaxone azithromycin, systemic steroid and breathing treatments.  BNP ordered and elevated to 1600.  TTE with LVEF of 30 to 35%, GH with inferior and inferoseptal akinesis, indeterminate DD, severe LAE, severe RAE and RVSP of 49 mmHg.  Patient was started on IV Lasix but discontinued due to AKI.  Also developed hypotension when she was started on low-dose GDMT but improved with IV fluid boluses.  Now with acute encephalopathy in the setting of acute respiratory acidosis, AKI, hypotension and hyperammonia. Started on BiPAP  Subjective: Seen and examined earlier this morning.  Was hypotensive to 70s/30s earlier this morning.  Hypotension improved with IV fluid boluses. Some concern about urinary retention. She had 600 cc on I&O cath.    She was confused and sleepy this morning.  ABG 7.17/48/70/17.3.  Ammonia 60. Cr 3.0.  BUN 63.  Objective: Vitals:   09/22/21 0830 09/22/21 0900 09/22/21 1022 09/22/21 1100  BP: 100/78 95/70 95/71    Pulse: 100 (!) 57 60 60  Resp: 16 17 14    Temp: 98.1 F (36.7 C)     TempSrc: Axillary     SpO2: 92% 96% 95% 98%  Weight:      Height:         Examination:  GENERAL: No apparent distress.  Nontoxic. HEENT: MMM.  Vision and hearing grossly intact.  NECK: Supple.  Notable JVD. RESP: Upper 80s to lower 90s on 1 L by [place].  No IWOB.  Fair aeration bilaterally. CVS:  RRR. Heart sounds normal.  ABD/GI/GU: BS+. Abd soft, NTND.  MSK/EXT:  Moves extremities. No apparent deformity. No edema.  SKIN: no apparent skin lesion or wound NEURO: Awake but not quite alert.  She is oriented to self and place but not time.  No facial asymmetry.  Moves all extremities symmetrically.  Follows commands.  No apparent focal neuro deficit but limited exam due to mental status. PSYCH: Somewhat anxious.  Confused.  Procedures:  None  Microbiology summarized: [id number] and influenza PCR nonreactive. Blood cultures NGTD.  Assessment & Plan: Acute on chronic respiratory failure with hypoxia: Multifactorial including COPD, PAH, pneumonia and CHF.  ABG consistent with acute respiratory acidosis with metabolic acidosis.  -Started BiPAP.  N.p.o. while on BiPAP. -Minimum oxygen to keep saturation above 88% once she comes off BiPAP. -Started IV sodium bicarbonate -Continue Solu-Medrol, antibiotics and nebulizers. -Incentive spirometry, OOB, PT, OT -Discussed with PCCM  RLL pneumonia: -Continue ceftriaxone and azithromycin  Acute systolic CHF/PAH: Elevated BNP 1600.  Appears euvolemic except for JVD TTE as above.  Started on IV Lasix but developed AKI.  Renal function worse again today likely due to hypotension.  She is likely preload dependent. -Continue holding diuretics and antihypertensive meds -Monitor fluid and respiratory status -  GDMT-did not tolerate due to hypotension and AKI. -Monitor fluid status, renal functions and electrolytes -I do not think she can undergo heart catheterization in the setting of pneumonia and COPD exacerbation. -Ambulatory referral to cardiology on discharge if she improves  AKI/azotemia on CKD-3B: Cr trended up, likely  due to Lasix and hypotension.  There is also concern about urinary tension.  She had about 600 cc UOP on I&O cath. Recent Labs    10/06/20 1204 09/19/21 1126 09/20/21 0501 09/21/21 0523 09/22/21 0526  BUN 23 32* 35* 49* 63*  CREATININE 1.73* 2.27* 2.26* 2.78* 3.07*  -Check renal ultrasound -Check urine chemistry and urinalysis -Place Foley catheter  Acute metabolic encephalopathy: Multifactorial including respiratory failure, hyperammonia and medications such as Ativan and quetiapine.  She was sleepy and confused this morning.  She had Xanax about 5 AM for agitation.  Neuro exam is nonfocal. -Decreased Ativan to 0.5 mg twice daily as needed, and quetiapine from 300 mg 200 mg nightly -Manage respiratory acidosis as above -Started lactulose for hyperammonia  Hypotension/history of essential hypertension: Was intermittently hypotensive since yesterday afternoon requiring intermittent boluses. -Started Imdur  Lactic acidosis: Likely from hypoperfusion in the setting of hypotension and CHF.  Improved.  History of third-degree AVB/PPM  Anxiety and depression: Stable -Continue home medications -Decrease diuretics  Hypothyroidism -Continue home Synthroid  Tobacco use disorder: Currently smokes about 2 pack a day. -Extensively discussed the importance of smoking cessation -Nicotine patch  None anion gap metabolic acidosis: Likely due to AKI. -IV sodium bicarbonate  Hyperphosphatemia: Likely due to renal failure. -Continue monitoring  Elevated liver enzymes: Likely ischemic insult in the setting of hypotension with possible congestive hepatopathy.  Acute hepatitis panel negative. -Continue monitoring -Hold statin  Thrombocytopenia/leukopenia: Relatively stable. -Continue monitoring  Goal of care: DNR/DNI.  Now with multiorgan failure.  Prognosis concerning.  Discussed with patient's daughter over the phone. -Palliative care consult  Body mass index is 26.05 kg/m.          DVT prophylaxis:  SCDs Start: 09/19/21 1924  Code Status: DNR/DNI. Family Communication: Updated patient's sister, Luellen Pucker over the phone. Level of care: Progressive Status is: Inpatient  The patient will remain inpatient because: Acute respiratory failure, hypotension, AKI and acute metabolic encephalopathy   Final disposition: Likely home once medically stable.   Consultants:  Discussed with pulmonologist over the phone.   Sch Meds:  Scheduled Meds:  atorvastatin  40 mg Oral Daily   carbamazepine  100 mg Oral BID   Chlorhexidine Gluconate Cloth  6 each Topical Daily   FLUoxetine  40 mg Oral Daily   insulin aspart  0-9 Units Subcutaneous TID WC   ipratropium  0.5 mg Nebulization TID   lactulose  30 g Oral TID   Or   lactulose  300 mL Rectal TID   levalbuterol  1.25 mg Nebulization TID   levothyroxine  125 mcg Oral Q0600   mouth rinse  15 mL Mouth Rinse BID   methylPREDNISolone (SOLU-MEDROL) injection  40 mg Intravenous Q12H   midodrine  5 mg Oral TID WC   nicotine  21 mg Transdermal Daily   pantoprazole  40 mg Oral Daily   QUEtiapine  300 mg Oral QHS   Continuous Infusions:  sodium chloride Stopped (09/19/21 1532)   azithromycin Stopped (09/21/21 1558)   cefTRIAXone (ROCEPHIN)  IV Stopped (09/21/21 1900)    sodium bicarbonate (isotonic) infusion in sterile water     PRN Meds:.sodium chloride, acetaminophen **OR** acetaminophen, benzonatate, levalbuterol, LORazepam, ondansetron (ZOFRAN)  IV, sodium chloride  Antimicrobials: Anti-infectives (From admission, onward)    Start     Dose/Rate Route Frequency Ordered Stop   09/20/21 1600  azithromycin (ZITHROMAX) 500 mg in sodium chloride 0.9 % 250 mL IVPB        500 mg 250 mL/hr over 60 Minutes Intravenous Every 24 hours 09/19/21 1925     09/20/21 1500  cefTRIAXone (ROCEPHIN) 1 g in sodium chloride 0.9 % 100 mL IVPB        1 g 200 mL/hr over 30 Minutes Intravenous Every 24 hours 09/19/21 1925     09/19/21 1515   cefTRIAXone (ROCEPHIN) 1 g in sodium chloride 0.9 % 100 mL IVPB        1 g 200 mL/hr over 30 Minutes Intravenous  Once 09/19/21 1503 09/19/21 1602   09/19/21 1515  azithromycin (ZITHROMAX) 500 mg in sodium chloride 0.9 % 250 mL IVPB        500 mg 250 mL/hr over 60 Minutes Intravenous  Once 09/19/21 1503 09/19/21 1746        I have personally reviewed the following labs and images: CBC: Recent Labs  Lab 09/19/21 1126 09/20/21 0501 09/21/21 0523 09/21/21 1735 09/22/21 0526  WBC 4.5 2.7* 3.4* 4.4 4.8  NEUTROABS 3.1 1.8  --   --   --   HGB 12.3 11.2* 12.0 11.8* 12.2  HCT 39.2 36.3 37.9 38.6 40.5  MCV 92.7 94.5 93.6 97.0 95.5  PLT 122* 114* 125* 113* 108*   BMP &GFR Recent Labs  Lab 09/19/21 1126 09/20/21 0501 09/21/21 0523 09/22/21 0526  NA 139 140 138 137  K 4.3 4.7 4.2 5.0  CL 109 112* 108 109  CO2 21* 19* 19* 19*  GLUCOSE 176* 118* 168* 162*  BUN 32* 35* 49* 63*  CREATININE 2.27* 2.26* 2.78* 3.07*  CALCIUM 8.1* 8.0* 7.6* 7.0*  MG  --  2.1 2.2 2.0  PHOS  --  6.0* 6.6* 7.3*   Estimated Creatinine Clearance: 19 mL/min (A) (by C-G formula based on SCr of 3.07 mg/dL (H)). Liver & Pancreas: Recent Labs  Lab 09/20/21 0501 09/21/21 0523 09/22/21 0526  AST 52* 60* 369*  ALT 64* 98* 364*  ALKPHOS 106 154* 161*  BILITOT 0.7 0.3 0.5  PROT 5.7* 6.2* 5.6*  ALBUMIN 3.4* 3.5   3.5 3.3*   No results for input(s): LIPASE, AMYLASE in the last 168 hours. Recent Labs  Lab 09/22/21 0933  AMMONIA 60*   Diabetic: No results for input(s): HGBA1C in the last 72 hours. Recent Labs  Lab 09/21/21 0753 09/21/21 1202 09/21/21 1609 09/21/21 2007 09/22/21 0748  GLUCAP 165* 161* 236* 126* 134*   Cardiac Enzymes: Recent Labs  Lab 09/22/21 0526  CKTOTAL 104   No results for input(s): PROBNP in the last 8760 hours. Coagulation Profile: No results for input(s): INR, PROTIME in the last 168 hours. Thyroid Function Tests: Recent Labs    09/19/21 2034  TSH 2.249    Lipid Profile: No results for input(s): CHOL, HDL, LDLCALC, TRIG, CHOLHDL, LDLDIRECT in the last 72 hours. Anemia Panel: No results for input(s): VITAMINB12, FOLATE, FERRITIN, TIBC, IRON, RETICCTPCT in the last 72 hours. Urine analysis:    Component Value Date/Time   COLORURINE STRAW (A) 09/19/2021 0037   APPEARANCEUR CLEAR 09/19/2021 0037   LABSPEC 1.004 (L) 09/19/2021 0037   PHURINE 5.0 09/19/2021 0037   GLUCOSEU NEGATIVE 09/19/2021 0037   HGBUR SMALL (A) 09/19/2021 0037   BILIRUBINUR NEGATIVE 09/19/2021 0037   BILIRUBINUR  neg 07/16/2016 1154   KETONESUR NEGATIVE 09/19/2021 0037   PROTEINUR NEGATIVE 09/19/2021 0037   UROBILINOGEN 1.0 07/16/2016 1154   NITRITE NEGATIVE 09/19/2021 0037   LEUKOCYTESUR NEGATIVE 09/19/2021 0037   Sepsis Labs: Invalid input(s): PROCALCITONIN, [place]  Microbiology: Recent Results (from the past 240 hour(s))  Resp Panel by RT-PCR (Flu A&B, Covid) Nasopharyngeal Swab     Status: None   Collection Time: 09/19/21 11:38 AM   Specimen: Nasopharyngeal Swab; Nasopharyngeal(NP) swabs in vial transport medium  Result Value Ref Range Status   SARS Coronavirus 2 by RT PCR NEGATIVE NEGATIVE Final    Comment: (NOTE) SARS-CoV-2 target nucleic acids are NOT DETECTED.  The SARS-CoV-2 RNA is generally detectable in upper respiratory specimens during the acute phase of infection. The lowest concentration of SARS-CoV-2 viral copies this assay can detect is 138 copies/mL. A negative result does not preclude SARS-Cov-2 infection and should not be used as the sole basis for treatment or other patient management decisions. A negative result may occur with  improper specimen collection/handling, submission of specimen other than nasopharyngeal swab, presence of viral mutation(s) within the areas targeted by this assay, and inadequate number of viral copies(<138 copies/mL). A negative result must be combined with clinical observations, patient history, and  epidemiological information. The expected result is Negative.  Fact Sheet for Patients:  EntrepreneurPulse.com.au  Fact Sheet for Healthcare Providers:  IncredibleEmployment.be  This test is no t yet approved or cleared by the [place] FDA and  has been authorized for detection and/or diagnosis of SARS-CoV-2 by FDA under an Emergency Use Authorization (EUA). This EUA will remain  in effect (meaning this test can be used) for the duration of the COVID-19 declaration under Section 564(b)(1) of the Act, 21 U.S.C.section 360bbb-3(b)(1), unless the authorization is terminated  or revoked sooner.       Influenza A by PCR NEGATIVE NEGATIVE Final   Influenza B by PCR NEGATIVE NEGATIVE Final    Comment: (NOTE) The Xpert Xpress SARS-CoV-2/FLU/RSV plus assay is intended as an aid in the diagnosis of influenza from Nasopharyngeal swab specimens and should not be used as a sole basis for treatment. Nasal washings and aspirates are unacceptable for Xpert Xpress SARS-CoV-2/FLU/RSV testing.  Fact Sheet for Patients: EntrepreneurPulse.com.au  Fact Sheet for Healthcare Providers: IncredibleEmployment.be  This test is not yet approved or cleared by the [place] FDA and has been authorized for detection and/or diagnosis of SARS-CoV-2 by FDA under an Emergency Use Authorization (EUA). This EUA will remain in effect (meaning this test can be used) for the duration of the COVID-19 declaration under Section 564(b)(1) of the Act, 21 U.S.C. section 360bbb-3(b)(1), unless the authorization is terminated or revoked.  Performed at [facility], [address] [place], [place] [address]   Culture, blood (Routine X 2) w Reflex to ID Panel     Status: None (Preliminary result)   Collection Time: 09/19/21  8:34 PM   Specimen: Left Antecubital; Blood  Result Value Ref Range Status   Specimen Description   Final     LEFT ANTECUBITAL Performed at [facility] [address], [place], [place] [address]    Special Requests   Final    BOTTLES DRAWN AEROBIC ONLY Blood Culture adequate volume Performed at [facility] [address], [place], [place] [address]    Culture   Final    NO GROWTH 2 DAYS Performed at [facility] [address], [place], [place] [address]  Report Status PENDING  Incomplete  Culture, blood (Routine X 2) w Reflex to ID Panel     Status: None (Preliminary result)   Collection Time: 09/19/21  8:34 PM   Specimen: BLOOD LEFT HAND  Result Value Ref Range Status   Specimen Description   Final    BLOOD LEFT HAND Performed at [facility] [address], [place], [place] [address]    Special Requests   Final    BOTTLES DRAWN AEROBIC ONLY Blood Culture adequate volume Performed at [facility] [address] [place], [place] [address]    Culture   Final    NO GROWTH 2 DAYS Performed at [facility] [address], [place], [place] [address]    Report Status PENDING  Incomplete    Radiology Studies: DG Chest Port 1 View  Result Date: 09/21/2021 CLINICAL DATA:  Nausea, chest pain 8/10, COPD, tobacco abuse EXAM: PORTABLE CHEST 1 VIEW COMPARISON:  09/19/2021 FINDINGS: Single frontal view of the chest demonstrates stable dual lead pacer. Cardiac silhouette is mildly enlarged but stable. Persistent consolidation at the medial right lung base. No large effusion. No pneumothorax. Left chest is clear. No acute bony abnormalities. IMPRESSION: 1. Stable right basilar consolidation compatible with pneumonia. Electronically Signed   By: Randa Ngo M.D.   On: 09/21/2021 18:11     CRITICAL CARE Performed by: [facility]   Total critical care time: 65 minutes  Critical care time was exclusive of separately billable procedures and treating other patients.  Critical care was necessary  to treat or prevent imminent or life-threatening deterioration.  Critical care was time spent personally by me on the following activities: development of treatment plan with patient and/or surrogate as well as nursing, discussions with consultants, evaluation of patient's response to treatment, examination of patient, obtaining history from patient or surrogate, ordering and performing treatments and interventions, ordering and review of laboratory studies, ordering and review of radiographic studies, pulse oximetry and re-evaluation of patient's condition.    Amadeus Oyama T. [place]  If 7PM-7AM, please contact night-coverage [email or web address] 09/22/2021, 11:06 AM

## 2021-09-23 ENCOUNTER — Inpatient Hospital Stay (HOSPITAL_COMMUNITY): Payer: Medicare Other

## 2021-09-23 LAB — COMPREHENSIVE METABOLIC PANEL
ALT: 441 U/L — ABNORMAL HIGH (ref 0–44)
AST: 293 U/L — ABNORMAL HIGH (ref 15–41)
Albumin: 3.4 g/dL — ABNORMAL LOW (ref 3.5–5.0)
Alkaline Phosphatase: 148 U/L — ABNORMAL HIGH (ref 38–126)
Anion gap: 14 (ref 5–15)
BUN: 76 mg/dL — ABNORMAL HIGH (ref 8–23)
CO2: 19 mmol/L — ABNORMAL LOW (ref 22–32)
Calcium: 6.9 mg/dL — ABNORMAL LOW (ref 8.9–10.3)
Chloride: 104 mmol/L (ref 98–111)
Creatinine, Ser: 3.41 mg/dL — ABNORMAL HIGH (ref 0.44–1.00)
GFR, Estimated: 14 mL/min — ABNORMAL LOW (ref >60)
Glucose, Bld: 160 mg/dL — ABNORMAL HIGH (ref 70–99)
Potassium: 4.7 mmol/L (ref 3.5–5.1)
Sodium: 137 mmol/L (ref 135–145)
Total Bilirubin: 0.5 mg/dL (ref 0.3–1.2)
Total Protein: 5.8 g/dL — ABNORMAL LOW (ref 6.5–8.1)

## 2021-09-23 LAB — CBC
HCT: 36.4 % (ref 36.0–46.0)
Hemoglobin: 11.5 g/dL — ABNORMAL LOW (ref 12.0–15.0)
MCH: 29.3 pg (ref 26.0–34.0)
MCHC: 31.6 g/dL (ref 30.0–36.0)
MCV: 92.9 fL (ref 80.0–100.0)
Platelets: 99 10*3/uL — ABNORMAL LOW (ref 150–400)
RBC: 3.92 MIL/uL (ref 3.87–5.11)
RDW: 17.2 % — ABNORMAL HIGH (ref 11.5–15.5)
WBC: 4.5 10*3/uL (ref 4.0–10.5)
nRBC: 4.9 % — ABNORMAL HIGH (ref 0.0–0.2)

## 2021-09-23 LAB — CK: Total CK: 123 U/L (ref 38–234)

## 2021-09-23 LAB — SODIUM, URINE, RANDOM: Sodium, Ur: <10 mmol/L

## 2021-09-23 LAB — GLUCOSE, CAPILLARY
Glucose-Capillary: 137 mg/dL — ABNORMAL HIGH (ref 70–99)
Glucose-Capillary: 136 mg/dL — ABNORMAL HIGH (ref 70–99)
Glucose-Capillary: 204 mg/dL — ABNORMAL HIGH (ref 70–99)
Glucose-Capillary: 129 mg/dL — ABNORMAL HIGH (ref 70–99)

## 2021-09-23 LAB — PROTEIN / CREATININE RATIO, URINE
Creatinine, Urine: 52.35 mg/dL
Protein Creatinine Ratio: 1.17 mg/mg{Cre} — ABNORMAL HIGH (ref 0.00–0.15)
Total Protein, Urine: 61 mg/dL

## 2021-09-23 LAB — AMMONIA: Ammonia: 58 umol/L — ABNORMAL HIGH (ref 9–35)

## 2021-09-23 LAB — PROTIME-INR
INR: 1.4 — ABNORMAL HIGH (ref 0.8–1.2)
Prothrombin Time: 17.1 seconds — ABNORMAL HIGH (ref 11.4–15.2)

## 2021-09-23 LAB — MAGNESIUM: Magnesium: 2.2 mg/dL (ref 1.7–2.4)

## 2021-09-23 LAB — CREATININE, URINE, RANDOM: Creatinine, Urine: 48.94 mg/dL

## 2021-09-23 LAB — URIC ACID: Uric Acid, Serum: 10.4 mg/dL — ABNORMAL HIGH (ref 2.5–7.1)

## 2021-09-23 LAB — PHOSPHORUS: Phosphorus: 7.9 mg/dL — ABNORMAL HIGH (ref 2.5–4.6)

## 2021-09-23 MED ORDER — LIDOCAINE 5 % EX PTCH
1.0000 | MEDICATED_PATCH | CUTANEOUS | Status: DC
  Administered 2021-09-23 – 2021-09-30 (×8): 1 via TRANSDERMAL
  Filled 2021-09-23 (×11): qty 1

## 2021-09-23 MED ORDER — STERILE WATER FOR INJECTION IV SOLN
INTRAVENOUS | Status: DC
  Filled 2021-09-23 (×3): qty 150
  Filled 2021-09-23: qty 1000

## 2021-09-23 MED ORDER — DICLOFENAC SODIUM 1 % EX GEL
2.0000 g | Freq: Four times a day (QID) | CUTANEOUS | Status: DC
  Administered 2021-09-23 – 2021-09-24 (×3): 2 g via TOPICAL
  Filled 2021-09-23: qty 100

## 2021-09-23 NOTE — Progress Notes (Signed)
Patient refuses BiPAP. RN made aware.

## 2021-09-23 NOTE — Progress Notes (Signed)
PROGRESS NOTE  Kathryn Buckley LFY:[id number] DOB: [birth year]   PCP: Debbrah Alar, NP  Patient is from: Home.  Lives alone.  Independently ambulates at baseline.  DOA: 09/19/2021 LOS: 3  Chief complaints:  Chief Complaint  Patient presents with   Shortness of Breath     Brief Narrative / Interim history: 71 year old F with PMH of COPD/chronic hypoxic RF on 2 L, CKD-3B, CHB/PPM, HTN, hypothyroidism, hyperlipidemia, anxiety, depression and tobacco use disorder (2 PPD) presenting with progressive shortness of breath, productive cough, fever, rigor and myalgia for about 2 to 3 weeks, and admitted for acute on chronic hypoxic respiratory failure in the setting of COPD exacerbation and community-acquired pneumonia, and AKI on CKD-3B.  CXR with RLL infiltrate. Cr 2.3 (baseline 1.5-1.7).  Started on ceftriaxone azithromycin, systemic steroid and breathing treatments.  BNP ordered and elevated to 1600.  TTE with LVEF of 30 to 35%, GH with inferior and inferoseptal akinesis, indeterminate DD, severe LAE, severe RAE and RVSP of 49 mmHg.  Patient was started on IV Lasix but discontinued due to AKI.  Also developed hypotension when she was started on low-dose GDMT but improved with IV fluid boluses.  Now with acute encephalopathy in the setting of mixed acidosis, AKI, hypotension and hyperammonia.  Encephalopathy improving with weaning oxygen and lactulose.  Not tolerating BiPAP.  She is DNR/DNI.  Subjective: Seen and examined earlier this morning and this afternoon..  No major events overnight of this morning.  No specific complaints but a little confused.  She denies pain or shortness of breath.  Denies nausea or vomiting.  Objective: Vitals:   09/23/21 0425 09/23/21 0500 09/23/21 0600 09/23/21 0925  BP:  116/78 115/80   Pulse: 60 (!) 57 (!) 101   Resp: 15 17 18    Temp:      TempSrc:      SpO2: 93% (!) 89% 91% 90%  Weight:      Height:        Examination:  GENERAL: No apparent  distress.  Nontoxic. HEENT: MMM.  Vision and hearing grossly intact.  NECK: Supple.  No apparent JVD.  RESP: 92% on 12 L by [place].  No IWOB.  Fair aeration bilaterally. CVS:  RRR. Heart sounds normal.  ABD/GI/GU: BS+. Abd soft, NTND.  MSK/EXT:  Moves extremities. No apparent deformity. No edema.  SKIN: no apparent skin lesion or wound NEURO: Awake but not quite alert.  Oriented x4 but a little confused.  No apparent focal neuro deficit. PSYCH: Calm. Normal affect.   Procedures:  None  Microbiology summarized: [id number] and influenza PCR nonreactive. Blood cultures NGTD.  Assessment & Plan: Acute on chronic respiratory failure with hypoxia: Multifactorial including COPD, PAH, pneumonia and CHF.  ABG consistent with mixed respiratory and metabolic acidosis. -BiPAP as needed-not tolerating much. -Minimum oxygen to keep saturation above 88% when off BiPAP -Continue IV sodium bicarbonate -Continue Solu-Medrol, antibiotics and nebulizers. -Incentive spirometry, OOB, PT, OT -Discussed with PCCM over the phone on 12/31.  In agreement.  RLL pneumonia: -Completed 5 days of ceftriaxone and azithromycin  Acute systolic CHF/PAH: Elevated BNP 1600.  Appears euvolemic except for JVD.  TTE as above.  Started on IV Lasix but developed AKI.  Hypotension with low-dose GDMT.  She is likely preload dependent. -Continue holding diuretics and antihypertensive meds -Monitor fluid and respiratory status -GDMT-did not tolerate due to hypotension and AKI. -Monitor fluid status, renal functions and electrolytes -I do not think she can undergo heart catheterization in the setting of pneumonia  and COPD exacerbation.  AKI/azotemia on CKD-3B: Cr trended up, likely due to Lasix and hypotension.  Also concern about urinary tension.  She had about 600 cc UOP on I&O cath.  Foley catheter placed on 12/31. Recent Labs    10/06/20 1204 09/19/21 1126 09/20/21 0501 09/21/21 0523 09/22/21 0526 09/23/21 0518  BUN 23  32* 35* 49* 63* 76*  CREATININE 1.73* 2.27* 2.26* 2.78* 3.07* 3.41*  -Check renal ultrasound -Check urine chemistry and urinalysis -Continue indwelling Foley catheter  Acute metabolic encephalopathy: Multifactorial including respiratory failure, hyperammonia and medications such as Ativan and quetiapine.  Improved. -Decreased Ativan to 0.5 mg twice daily as needed, and quetiapine from 300 mg to 100 mg nightly -Manage respiratory acidosis as above -Continue lactulose 30 g 3 times daily  Hypotension/history of essential hypertension: Seems to have resolved. -Continue midodrine  Lactic acidosis: Likely from hypoperfusion in the setting of hypotension and CHF.  Improved.  History of third-degree AVB/PPM  Anxiety and depression: Stable -Adjusted home meds as above for encephalopathy  Hypothyroidism -Continue home Synthroid  Tobacco use disorder: Currently smokes about 2 pack a day. -Extensively discussed the importance of smoking cessation -Nicotine patch  None anion gap metabolic acidosis: Likely due to AKI. -IV sodium bicarbonate  Hyperphosphatemia: Likely due to renal failure. -Continue monitoring -Start Phos binder if no improvement  Elevated liver enzymes: Likely ischemic insult in the setting of hypotension with possible congestive hepatopathy.  Acute hepatitis panel negative. -Continue monitoring -Continue holding statin  Thrombocytopenia/leukopenia: Relatively stable. -Continue monitoring -SCD for VTE prophylaxis  Goal of care: DNR/DNI.  Now with multiorgan failure.  Prognosis concerning.  Discussed with patient's daughter over the phone. -Palliative care consulted  Body mass index is 26.05 kg/m.         DVT prophylaxis:  SCDs Start: 09/19/21 1924  Code Status: DNR/DNI. Family Communication: Updated patient's sister over the phone on 12/31.  None at bedside Level of care: Progressive Status is: Inpatient  The patient will remain inpatient because: Acute  respiratory failure, AKI and acute metabolic encephalopathy   Final disposition: Likely home once medically stable.   Consultants:  Discussed with pulmonologist over the phone. Palliative medicine   Sch Meds:  Scheduled Meds:  carbamazepine  100 mg Oral BID   chlorhexidine  15 mL Mouth Rinse BID   Chlorhexidine Gluconate Cloth  6 each Topical Daily   FLUoxetine  40 mg Oral Daily   insulin aspart  0-9 Units Subcutaneous TID WC   ipratropium  0.5 mg Nebulization TID   lactulose  30 g Oral TID   Or   lactulose  300 mL Rectal TID   levalbuterol  1.25 mg Nebulization TID   levothyroxine  125 mcg Oral Q0600   mouth rinse  15 mL Mouth Rinse BID   methylPREDNISolone (SOLU-MEDROL) injection  40 mg Intravenous Q12H   midodrine  5 mg Oral TID WC   nicotine  21 mg Transdermal Daily   pantoprazole  40 mg Oral Daily   QUEtiapine  100 mg Oral QHS   sodium bicarbonate  650 mg Oral TID   Continuous Infusions:  sodium chloride Stopped (09/19/21 1532)   azithromycin Stopped (09/22/21 2153)   cefTRIAXone (ROCEPHIN)  IV Stopped (09/22/21 1951)   PRN Meds:.sodium chloride, benzonatate, levalbuterol, LORazepam, ondansetron (ZOFRAN) IV, sodium chloride  Antimicrobials: Anti-infectives (From admission, onward)    Start     Dose/Rate Route Frequency Ordered Stop   09/20/21 1600  azithromycin (ZITHROMAX) 500 mg in sodium chloride 0.9 %  250 mL IVPB        500 mg 250 mL/hr over 60 Minutes Intravenous Every 24 hours 09/19/21 1925     09/20/21 1500  cefTRIAXone (ROCEPHIN) 1 g in sodium chloride 0.9 % 100 mL IVPB        1 g 200 mL/hr over 30 Minutes Intravenous Every 24 hours 09/19/21 1925     09/19/21 1515  cefTRIAXone (ROCEPHIN) 1 g in sodium chloride 0.9 % 100 mL IVPB        1 g 200 mL/hr over 30 Minutes Intravenous  Once 09/19/21 1503 09/19/21 1602   09/19/21 1515  azithromycin (ZITHROMAX) 500 mg in sodium chloride 0.9 % 250 mL IVPB        500 mg 250 mL/hr over 60 Minutes Intravenous  Once  09/19/21 1503 09/19/21 1746        I have personally reviewed the following labs and images: CBC: Recent Labs  Lab 09/19/21 1126 09/20/21 0501 09/21/21 0523 09/21/21 1735 09/22/21 0526 09/23/21 0518  WBC 4.5 2.7* 3.4* 4.4 4.8 4.5  NEUTROABS 3.1 1.8  --   --   --   --   HGB 12.3 11.2* 12.0 11.8* 12.2 11.5*  HCT 39.2 36.3 37.9 38.6 40.5 36.4  MCV 92.7 94.5 93.6 97.0 95.5 92.9  PLT 122* 114* 125* 113* 108* 99*   BMP &GFR Recent Labs  Lab 09/19/21 1126 09/20/21 0501 09/21/21 0523 09/22/21 0526 09/23/21 0518  NA 139 140 138 137 137  K 4.3 4.7 4.2 5.0 4.7  CL 109 112* 108 109 104  CO2 21* 19* 19* 19* 19*  GLUCOSE 176* 118* 168* 162* 160*  BUN 32* 35* 49* 63* 76*  CREATININE 2.27* 2.26* 2.78* 3.07* 3.41*  CALCIUM 8.1* 8.0* 7.6* 7.0* 6.9*  MG  --  2.1 2.2 2.0 2.2  PHOS  --  6.0* 6.6* 7.3* 7.9*   Estimated Creatinine Clearance: 17.1 mL/min (A) (by C-G formula based on SCr of 3.41 mg/dL (H)). Liver & Pancreas: Recent Labs  Lab 09/20/21 0501 09/21/21 0523 09/22/21 0526 09/23/21 0518  AST 52* 60* 369* 293*  ALT 64* 98* 364* 441*  ALKPHOS 106 154* 161* 148*  BILITOT 0.7 0.3 0.5 0.5  PROT 5.7* 6.2* 5.6* 5.8*  ALBUMIN 3.4* 3.5   3.5 3.3* 3.4*   No results for input(s): LIPASE, AMYLASE in the last 168 hours. Recent Labs  Lab 09/22/21 0933 09/23/21 0518  AMMONIA 60* 52*   Diabetic: No results for input(s): HGBA1C in the last 72 hours. Recent Labs  Lab 09/22/21 1232 09/22/21 1626 09/22/21 2105 09/23/21 0751 09/23/21 1105  GLUCAP 113* 138* 170* 137* 136*   Cardiac Enzymes: Recent Labs  Lab 09/22/21 0526 09/23/21 0518  CKTOTAL 104 123   No results for input(s): PROBNP in the last 8760 hours. Coagulation Profile: Recent Labs  Lab 09/23/21 0518  INR 1.4*   Thyroid Function Tests: No results for input(s): TSH, T4TOTAL, FREET4, T3FREE, THYROIDAB in the last 72 hours.  Lipid Profile: No results for input(s): CHOL, HDL, LDLCALC, TRIG, CHOLHDL,  LDLDIRECT in the last 72 hours. Anemia Panel: No results for input(s): VITAMINB12, FOLATE, FERRITIN, TIBC, IRON, RETICCTPCT in the last 72 hours. Urine analysis:    Component Value Date/Time   COLORURINE STRAW (A) 09/19/2021 0037   APPEARANCEUR CLEAR 09/19/2021 0037   LABSPEC 1.004 (L) 09/19/2021 0037   PHURINE 5.0 09/19/2021 0037   GLUCOSEU NEGATIVE 09/19/2021 0037   HGBUR SMALL (A) 09/19/2021 0037   BILIRUBINUR NEGATIVE 09/19/2021 0037  BILIRUBINUR neg 07/16/2016 1154   KETONESUR NEGATIVE 09/19/2021 0037   PROTEINUR NEGATIVE 09/19/2021 0037   UROBILINOGEN 1.0 07/16/2016 1154   NITRITE NEGATIVE 09/19/2021 0037   LEUKOCYTESUR NEGATIVE 09/19/2021 0037   Sepsis Labs: Invalid input(s): PROCALCITONIN, [place]  Microbiology: Recent Results (from the past 240 hour(s))  Resp Panel by RT-PCR (Flu A&B, Covid) Nasopharyngeal Swab     Status: None   Collection Time: 09/19/21 11:38 AM   Specimen: Nasopharyngeal Swab; Nasopharyngeal(NP) swabs in vial transport medium  Result Value Ref Range Status   SARS Coronavirus 2 by RT PCR NEGATIVE NEGATIVE Final    Comment: (NOTE) SARS-CoV-2 target nucleic acids are NOT DETECTED.  The SARS-CoV-2 RNA is generally detectable in upper respiratory specimens during the acute phase of infection. The lowest concentration of SARS-CoV-2 viral copies this assay can detect is 138 copies/mL. A negative result does not preclude SARS-Cov-2 infection and should not be used as the sole basis for treatment or other patient management decisions. A negative result may occur with  improper specimen collection/handling, submission of specimen other than nasopharyngeal swab, presence of viral mutation(s) within the areas targeted by this assay, and inadequate number of viral copies(<138 copies/mL). A negative result must be combined with clinical observations, patient history, and epidemiological information. The expected result is Negative.  Fact Sheet for  Patients:  EntrepreneurPulse.com.au  Fact Sheet for Healthcare Providers:  IncredibleEmployment.be  This test is no t yet approved or cleared by the [place] FDA and  has been authorized for detection and/or diagnosis of SARS-CoV-2 by FDA under an Emergency Use Authorization (EUA). This EUA will remain  in effect (meaning this test can be used) for the duration of the COVID-19 declaration under Section 564(b)(1) of the Act, 21 U.S.C.section 360bbb-3(b)(1), unless the authorization is terminated  or revoked sooner.       Influenza A by PCR NEGATIVE NEGATIVE Final   Influenza B by PCR NEGATIVE NEGATIVE Final    Comment: (NOTE) The Xpert Xpress SARS-CoV-2/FLU/RSV plus assay is intended as an aid in the diagnosis of influenza from Nasopharyngeal swab specimens and should not be used as a sole basis for treatment. Nasal washings and aspirates are unacceptable for Xpert Xpress SARS-CoV-2/FLU/RSV testing.  Fact Sheet for Patients: EntrepreneurPulse.com.au  Fact Sheet for Healthcare Providers: IncredibleEmployment.be  This test is not yet approved or cleared by the [place] FDA and has been authorized for detection and/or diagnosis of SARS-CoV-2 by FDA under an Emergency Use Authorization (EUA). This EUA will remain in effect (meaning this test can be used) for the duration of the COVID-19 declaration under Section 564(b)(1) of the Act, 21 U.S.C. section 360bbb-3(b)(1), unless the authorization is terminated or revoked.  Performed at [facility], [address] [place], [place] [address]   Culture, blood (Routine X 2) w Reflex to ID Panel     Status: None (Preliminary result)   Collection Time: 09/19/21  8:34 PM   Specimen: Left Antecubital; Blood  Result Value Ref Range Status   Specimen Description   Final    LEFT ANTECUBITAL Performed at [facility]  [address] [place], [place] [address]    Special Requests   Final    BOTTLES DRAWN AEROBIC ONLY Blood Culture adequate volume Performed at [facility] [address], [place], [place] [address]    Culture   Final    NO GROWTH 3 DAYS Performed at [facility], [place] [address], [place], [place]  [address]    Report Status PENDING  Incomplete  Culture, blood (Routine X 2) w Reflex to ID Panel     Status: None (Preliminary result)   Collection Time: 09/19/21  8:34 PM   Specimen: BLOOD LEFT HAND  Result Value Ref Range Status   Specimen Description   Final    BLOOD LEFT HAND Performed at [facility] [address], [place], [place] [address]    Special Requests   Final    BOTTLES DRAWN AEROBIC ONLY Blood Culture adequate volume Performed at [facility] [address] [place], [place] [address]    Culture   Final    NO GROWTH 3 DAYS Performed at [facility], [place] [address], [place], [place] [address]    Report Status PENDING  Incomplete    Radiology Studies: No results found.   Hallee Mckenny T. [place]  If 7PM-7AM, please contact night-coverage [email or web address] 09/23/2021, 1:12 PM

## 2021-09-24 DIAGNOSIS — N179 Acute kidney failure, unspecified: Secondary | ICD-10-CM | POA: Diagnosis not present

## 2021-09-24 DIAGNOSIS — J9621 Acute and chronic respiratory failure with hypoxia: Secondary | ICD-10-CM | POA: Diagnosis not present

## 2021-09-24 DIAGNOSIS — J441 Chronic obstructive pulmonary disease with (acute) exacerbation: Secondary | ICD-10-CM | POA: Diagnosis not present

## 2021-09-24 DIAGNOSIS — J189 Pneumonia, unspecified organism: Secondary | ICD-10-CM | POA: Diagnosis not present

## 2021-09-24 DIAGNOSIS — G9340 Encephalopathy, unspecified: Secondary | ICD-10-CM

## 2021-09-24 DIAGNOSIS — Z7189 Other specified counseling: Secondary | ICD-10-CM | POA: Diagnosis not present

## 2021-09-24 DIAGNOSIS — Z515 Encounter for palliative care: Secondary | ICD-10-CM | POA: Diagnosis not present

## 2021-09-24 LAB — COMPREHENSIVE METABOLIC PANEL
ALT: 334 U/L — ABNORMAL HIGH (ref 0–44)
AST: 132 U/L — ABNORMAL HIGH (ref 15–41)
Albumin: 3.3 g/dL — ABNORMAL LOW (ref 3.5–5.0)
Alkaline Phosphatase: 178 U/L — ABNORMAL HIGH (ref 38–126)
Anion gap: 13 (ref 5–15)
BUN: 78 mg/dL — ABNORMAL HIGH (ref 8–23)
CO2: 23 mmol/L (ref 22–32)
Calcium: 6.9 mg/dL — ABNORMAL LOW (ref 8.9–10.3)
Chloride: 102 mmol/L (ref 98–111)
Creatinine, Ser: 3.49 mg/dL — ABNORMAL HIGH (ref 0.44–1.00)
GFR, Estimated: 13 mL/min — ABNORMAL LOW (ref >60)
Glucose, Bld: 194 mg/dL — ABNORMAL HIGH (ref 70–99)
Potassium: 3.6 mmol/L (ref 3.5–5.1)
Sodium: 138 mmol/L (ref 135–145)
Total Bilirubin: 0.4 mg/dL (ref 0.3–1.2)
Total Protein: 5.5 g/dL — ABNORMAL LOW (ref 6.5–8.1)

## 2021-09-24 LAB — CBC
HCT: 36.4 % (ref 36.0–46.0)
Hemoglobin: 11.5 g/dL — ABNORMAL LOW (ref 12.0–15.0)
MCH: 29.3 pg (ref 26.0–34.0)
MCHC: 31.6 g/dL (ref 30.0–36.0)
MCV: 92.6 fL (ref 80.0–100.0)
Platelets: 102 10*3/uL — ABNORMAL LOW (ref 150–400)
RBC: 3.93 MIL/uL (ref 3.87–5.11)
RDW: 17.3 % — ABNORMAL HIGH (ref 11.5–15.5)
WBC: 4.1 10*3/uL (ref 4.0–10.5)
nRBC: 2.4 % — ABNORMAL HIGH (ref 0.0–0.2)

## 2021-09-24 LAB — MAGNESIUM: Magnesium: 2.3 mg/dL (ref 1.7–2.4)

## 2021-09-24 LAB — PHOSPHORUS: Phosphorus: 7.7 mg/dL — ABNORMAL HIGH (ref 2.5–4.6)

## 2021-09-24 LAB — GLUCOSE, CAPILLARY
Glucose-Capillary: 158 mg/dL — ABNORMAL HIGH (ref 70–99)
Glucose-Capillary: 188 mg/dL — ABNORMAL HIGH (ref 70–99)
Glucose-Capillary: 199 mg/dL — ABNORMAL HIGH (ref 70–99)
Glucose-Capillary: 167 mg/dL — ABNORMAL HIGH (ref 70–99)

## 2021-09-24 LAB — AMMONIA: Ammonia: 63 umol/L — ABNORMAL HIGH (ref 9–35)

## 2021-09-24 LAB — LACTIC ACID, PLASMA
Lactic Acid, Venous: 1.1 mmol/L (ref 0.5–1.9)
Lactic Acid, Venous: 1.2 mmol/L (ref 0.5–1.9)

## 2021-09-24 LAB — PROTIME-INR
INR: 1.3 — ABNORMAL HIGH (ref 0.8–1.2)
Prothrombin Time: 16.5 seconds — ABNORMAL HIGH (ref 11.4–15.2)

## 2021-09-24 MED ORDER — HYDROMORPHONE HCL 1 MG/ML IJ SOLN
0.5000 mg | INTRAMUSCULAR | Status: DC | PRN
  Administered 2021-09-24 – 2021-09-25 (×2): 0.5 mg via INTRAVENOUS
  Filled 2021-09-24 (×2): qty 0.5

## 2021-09-24 MED ORDER — LORAZEPAM 0.5 MG PO TABS
0.5000 mg | ORAL_TABLET | ORAL | Status: DC | PRN
  Administered 2021-09-26: 0.5 mg via ORAL
  Administered 2021-09-27: 1 mg via ORAL
  Administered 2021-09-27: 0.5 mg via ORAL
  Administered 2021-09-28 (×2): 1 mg via ORAL
  Administered 2021-09-29: 0.5 mg via ORAL
  Administered 2021-09-29: 1 mg via ORAL
  Administered 2021-09-30 – 2021-10-03 (×4): 0.5 mg via ORAL
  Filled 2021-09-24 (×2): qty 1
  Filled 2021-09-24: qty 2
  Filled 2021-09-24: qty 1
  Filled 2021-09-24: qty 2
  Filled 2021-09-24: qty 1
  Filled 2021-09-24: qty 2
  Filled 2021-09-24 (×2): qty 1
  Filled 2021-09-24: qty 2
  Filled 2021-09-24 (×2): qty 1
  Filled 2021-09-24: qty 2

## 2021-09-24 NOTE — Progress Notes (Signed)
PROGRESS NOTE  Kathryn Buckley RKY:[id number] DOB: [birth year]    Brief Narrative / Interim history: 71 year old female with past medical history of COPD, chronic hypoxic respiratory failure on 2 L of oxygen, CKD stage IIIb, complete heart block status post pacemaker placement, hypertension, hypothyroidism, hyperlipidemia, anxiety and depression presented to hospital with progressive shortness of breath, cough, fever chills and myalgia for 2 to 3 weeks.  Patient was initially admitted with acute on chronic hypoxic respiratory failure with community-acquired pneumonia and acute kidney injury on CKD stage IIIb.  Chest x-ray showed a right lower lobe opacity.  Echocardiogram showed LV ejection fraction of 30 to 35% with septal akinesia of.  Patient was started on Lasix but was subsequently discontinued due to AKI.  He also developed hypotension during hospitalization.  Now with metabolic encephalopathy acidosis acute kidney injury hypertension and hyperammonemia.  She was not able to tolerate her BiPAP and was constantly fighting against it.  Palliative care was then consulted for goals of care.  Assessment & Plan:  Acute on chronic respiratory failure with hypoxia:  Likely multifactorial from COPD, pulmonary arterial hypertension, pneumonia and congestive heart failure.  Arterial blood gas analysis shows a mixed respiratory and metabolic acidosis.  Patient was put on BiPAP but was not tolerating much.  Currently on nasal cannula oxygen.  Initially received IV fluid and bicarbonate.  Received the Solu-Medrol antibiotic nebulizers.   RLL pneumonia: Completed 5-day course of Rocephin and Zithromax.  Acute systolic congestive heart failure.   Elevated BNP at 1600 on presentation.  Was initially on IV Lasix but hypotension limited its usage.   AKI/azotemia on CKD-3b:  Creatinine trended up during IV diuretics.  Renal ultrasound showed increased echogenicity.  Continue Foley catheter for now.  Acute  metabolic encephalopathy:  Thought to be multifactorial from respiratory failure hyperammonemia medications.  Ativan and Seroquel doses were decreased.  Continue lactulose.  Hypotension/history of essential hypertension: Seems to have resolved. On midodrine at this time.  We will continue for 1 more day.  Lactic acidosis:  Likely secondary to hypoperfusion from hypotension and CHF.  Improved at this time   History of third-degree AVB/ Has permanent pacemaker.  Anxiety and depression: Ativan and Seroquel has been decreased  Hypothyroidism Continue Synthroid   Tobacco use disorder:  Was smoking 2 packs a day.  On nicotine patch.   Nonanion gap metabolic acidosis: Secondary to AKI.  Improved with IV bicarbonate   Hyperphosphatemia: Likely due to renal failure.  Check phosphate level in AM.  Elevated liver enzymes: Likely congestive hepatomegaly/ischemic insult in the setting of hypertension.    Acute hepatitis panel negative.  Hold statins.  Continue to monitor LFTs.    Thrombocytopenia Platelet count of 102K today.  We will continue to monitor. -SCD for VTE prophylaxis  Goal of care: DNR/DNI.   Patient with multiple medical comorbidities and organ failure.  Palliative care has seen the patient.  Plan is no escalation of care no BiPAP or higher level of care.  If patient continues to decline initiation of comfort care.   Subjective: Today, patient was seen and examined at bedside.  Complains of shortness of breath and dyspnea.  Nursing staff reported that she was pulling on the BiPAP and would not wear it.  Appears to be hallucinating confused and disoriented.    Objective: Vitals:   09/24/21 1000 09/24/21 1200 09/24/21 1354 09/24/21 1600  BP: 134/89 134/90  (!) 127/91  Pulse: (!) 110 (!) 54  (!) 109  Resp: 18 19  19  Temp:  98.6 F (37 C)  98.6 F (37 C)  TempSrc:  Axillary  Oral  SpO2: (!) 74% 94% 93% 94%  Weight:      Height:        Physical examination:  General:  Alert awake confused but disoriented, and oxygen HENT:   No scleral pallor or icterus noted. Oral mucosa is moist.  Chest:    Diminished breath sounds bilaterally.  Breath sounds noted, CVS: S1 &S2 heard. No murmur.  Regular rate and rhythm. Abdomen: Soft, nontender, nondistended.  Bowel sounds are heard.   Extremities: No cyanosis, clubbing or edema.  Peripheral pulses are palpable. Psych: Alert, awake and communicative, confused and disoriented CNS: Alert awake, confused, moving all extremities. Skin: Warm and dry.  No rashes noted. Procedures:  None       DVT prophylaxis:  SCDs Start: 09/19/21 1924  Code Status: DNR/DNI.  Family Communication:  Palliative care discussing with the family   Level of care: Progressive Status is: Inpatient  The patient will remain inpatient because: Acute respiratory failure, AKI,acute metabolic encephalopathy  Consultants:   Palliative medicine  pulmonary verbal consult  Antimicrobials: Rocephin IV  Anti-infectives (From admission, onward)    Start     Dose/Rate Route Frequency Ordered Stop   09/20/21 1600  azithromycin (ZITHROMAX) 500 mg in sodium chloride 0.9 % 250 mL IVPB        500 mg 250 mL/hr over 60 Minutes Intravenous Every 24 hours 09/19/21 1925 09/24/21 0113   09/20/21 1500  cefTRIAXone (ROCEPHIN) 1 g in sodium chloride 0.9 % 100 mL IVPB        1 g 200 mL/hr over 30 Minutes Intravenous Every 24 hours 09/19/21 1925     09/19/21 1515  cefTRIAXone (ROCEPHIN) 1 g in sodium chloride 0.9 % 100 mL IVPB        1 g 200 mL/hr over 30 Minutes Intravenous  Once 09/19/21 1503 09/19/21 1602   09/19/21 1515  azithromycin (ZITHROMAX) 500 mg in sodium chloride 0.9 % 250 mL IVPB        500 mg 250 mL/hr over 60 Minutes Intravenous  Once 09/19/21 1503 09/19/21 1746        I have personally reviewed the following labs and imaging studies CBC: Recent Labs  Lab 09/19/21 1126 09/20/21 0501 09/21/21 0523 09/21/21 1735 09/22/21 0526  09/23/21 0518 09/24/21 0453  WBC 4.5 2.7* 3.4* 4.4 4.8 4.5 4.1  NEUTROABS 3.1 1.8  --   --   --   --   --   HGB 12.3 11.2* 12.0 11.8* 12.2 11.5* 11.5*  HCT 39.2 36.3 37.9 38.6 40.5 36.4 36.4  MCV 92.7 94.5 93.6 97.0 95.5 92.9 92.6  PLT 122* 114* 125* 113* 108* 99* 102*    BMP &GFR Recent Labs  Lab 09/20/21 0501 09/21/21 0523 09/22/21 0526 09/23/21 0518 09/24/21 0453  NA 140 138 137 137 138  K 4.7 4.2 5.0 4.7 3.6  CL 112* 108 109 104 102  CO2 19* 19* 19* 19* 23  GLUCOSE 118* 168* 162* 160* 194*  BUN 35* 49* 63* 76* 78*  CREATININE 2.26* 2.78* 3.07* 3.41* 3.49*  CALCIUM 8.0* 7.6* 7.0* 6.9* 6.9*  MG 2.1 2.2 2.0 2.2 2.3  PHOS 6.0* 6.6* 7.3* 7.9* 7.7*    Estimated Creatinine Clearance: 16.9 mL/min (A) (by C-G formula based on SCr of 3.49 mg/dL (H)). Liver & Pancreas: Recent Labs  Lab 09/20/21 0501 09/21/21 3710 09/22/21 6269 09/23/21 0518 09/24/21 4854  AST 52* 60* 369* 293* 132*  ALT 64* 98* 364* 441* 334*  ALKPHOS 106 154* 161* 148* 178*  BILITOT 0.7 0.3 0.5 0.5 0.4  PROT 5.7* 6.2* 5.6* 5.8* 5.5*  ALBUMIN 3.4* 3.5   3.5 3.3* 3.4* 3.3*    No results for input(s): LIPASE, AMYLASE in the last 168 hours. Recent Labs  Lab 09/22/21 0933 09/23/21 0518 09/24/21 0453  AMMONIA 60* 58* 63*    Diabetic: No results for input(s): HGBA1C in the last 72 hours. Recent Labs  Lab 09/23/21 1649 09/23/21 1957 09/24/21 0727 09/24/21 1138 09/24/21 1733  GLUCAP 204* 129* 158* 188* 199*    Cardiac Enzymes: Recent Labs  Lab 09/22/21 0526 09/23/21 0518  CKTOTAL 104 123    No results for input(s): PROBNP in the last 8760 hours. Coagulation Profile: Recent Labs  Lab 09/23/21 0518 09/24/21 0453  INR 1.4* 1.3*    Thyroid Function Tests: No results for input(s): TSH, T4TOTAL, FREET4, T3FREE, THYROIDAB in the last 72 hours.  Lipid Profile: No results for input(s): CHOL, HDL, LDLCALC, TRIG, CHOLHDL, LDLDIRECT in the last 72 hours. Anemia Panel: No results for  input(s): VITAMINB12, FOLATE, FERRITIN, TIBC, IRON, RETICCTPCT in the last 72 hours. Urine analysis:    Component Value Date/Time   COLORURINE STRAW (A) 09/19/2021 0037   APPEARANCEUR CLEAR 09/19/2021 0037   LABSPEC 1.004 (L) 09/19/2021 0037   PHURINE 5.0 09/19/2021 0037   GLUCOSEU NEGATIVE 09/19/2021 0037   HGBUR SMALL (A) 09/19/2021 0037   BILIRUBINUR NEGATIVE 09/19/2021 0037   BILIRUBINUR neg 07/16/2016 1154   KETONESUR NEGATIVE 09/19/2021 0037   PROTEINUR NEGATIVE 09/19/2021 0037   UROBILINOGEN 1.0 07/16/2016 1154   NITRITE NEGATIVE 09/19/2021 0037   LEUKOCYTESUR NEGATIVE 09/19/2021 0037   Sepsis Labs: Invalid input(s): PROCALCITONIN, [place]  Microbiology: Recent Results (from the past 240 hour(s))  Resp Panel by RT-PCR (Flu A&B, Covid) Nasopharyngeal Swab     Status: None   Collection Time: 09/19/21 11:38 AM   Specimen: Nasopharyngeal Swab; Nasopharyngeal(NP) swabs in vial transport medium  Result Value Ref Range Status   SARS Coronavirus 2 by RT PCR NEGATIVE NEGATIVE Final    Comment: (NOTE) SARS-CoV-2 target nucleic acids are NOT DETECTED.  The SARS-CoV-2 RNA is generally detectable in upper respiratory specimens during the acute phase of infection. The lowest concentration of SARS-CoV-2 viral copies this assay can detect is 138 copies/mL. A negative result does not preclude SARS-Cov-2 infection and should not be used as the sole basis for treatment or other patient management decisions. A negative result may occur with  improper specimen collection/handling, submission of specimen other than nasopharyngeal swab, presence of viral mutation(s) within the areas targeted by this assay, and inadequate number of viral copies(<138 copies/mL). A negative result must be combined with clinical observations, patient history, and epidemiological information. The expected result is Negative.  Fact Sheet for Patients:  EntrepreneurPulse.com.au  Fact  Sheet for Healthcare Providers:  IncredibleEmployment.be  This test is no t yet approved or cleared by the [place] FDA and  has been authorized for detection and/or diagnosis of SARS-CoV-2 by FDA under an Emergency Use Authorization (EUA). This EUA will remain  in effect (meaning this test can be used) for the duration of the COVID-19 declaration under Section 564(b)(1) of the Act, 21 U.S.C.section 360bbb-3(b)(1), unless the authorization is terminated  or revoked sooner.       Influenza A by PCR NEGATIVE NEGATIVE Final   Influenza B by PCR NEGATIVE NEGATIVE Final    Comment: (  NOTE) The Xpert Xpress SARS-CoV-2/FLU/RSV plus assay is intended as an aid in the diagnosis of influenza from Nasopharyngeal swab specimens and should not be used as a sole basis for treatment. Nasal washings and aspirates are unacceptable for Xpert Xpress SARS-CoV-2/FLU/RSV testing.  Fact Sheet for Patients: EntrepreneurPulse.com.au  Fact Sheet for Healthcare Providers: IncredibleEmployment.be  This test is not yet approved or cleared by the [place] FDA and has been authorized for detection and/or diagnosis of SARS-CoV-2 by FDA under an Emergency Use Authorization (EUA). This EUA will remain in effect (meaning this test can be used) for the duration of the COVID-19 declaration under Section 564(b)(1) of the Act, 21 U.S.C. section 360bbb-3(b)(1), unless the authorization is terminated or revoked.  Performed at [facility], [address] [place], [place] [address]   Culture, blood (Routine X 2) w Reflex to ID Panel     Status: None (Preliminary result)   Collection Time: 09/19/21  8:34 PM   Specimen: Left Antecubital; Blood  Result Value Ref Range Status   Specimen Description   Final    LEFT ANTECUBITAL Performed at [facility] [address] [place], [place] [address]    Special Requests   Final     BOTTLES DRAWN AEROBIC ONLY Blood Culture adequate volume Performed at [place] [address], [place], [place] [address]    Culture   Final    NO GROWTH 4 DAYS Performed at [facility], [place] [address], [place], [place] [address]    Report Status PENDING  Incomplete  Culture, blood (Routine X 2) w Reflex to ID Panel     Status: None (Preliminary result)   Collection Time: 09/19/21  8:34 PM   Specimen: BLOOD LEFT HAND  Result Value Ref Range Status   Specimen Description   Final    BLOOD LEFT HAND Performed at [facility] [address] [place], [place] [address]    Special Requests   Final    BOTTLES DRAWN AEROBIC ONLY Blood Culture adequate volume Performed at [facility] [address], [place], [place] [address]    Culture   Final    NO GROWTH 4 DAYS Performed at [facility], [place] [address] [place], [place] [address]    Report Status PENDING  Incomplete    Radiology Studies: US RENAL  Result Date: 09/23/2021 CLINICAL DATA:  Acute renal injury. EXAM: RENAL / URINARY TRACT ULTRASOUND COMPLETE COMPARISON:  None. FINDINGS: Right Kidney: Renal measurements: 10.2 cm x 4.4 cm x 6.3 cm = volume: 148.5 mL. Diffusely increased echogenicity of the renal parenchyma is noted. No mass or hydronephrosis visualized. Left Kidney: Renal measurements: 10.4 cm x 5.3 cm x 5.8 cm = volume: 167.7 mL. Diffusely increased echogenicity of the renal parenchyma is noted. No mass or hydronephrosis visualized. Bladder: A Foley catheter is in place. Other: A trace amount of ascites is seen adjacent to the liver and spleen. IMPRESSION: 1. Increased renal echogenicity which may be secondary to medical renal disease. 2. Trace amount of ascites within the upper abdomen. Electronically Signed   By: Virgina Norfolk M.D.   On: 09/23/2021 20:47     Oscar La, MD [facility]  If 7PM-7AM, please contact  night-coverage [email or web address] 09/24/2021, 6:07 PM

## 2021-09-24 NOTE — Progress Notes (Signed)
PT Cancellation Note  Patient Details Name: Kathryn Buckley MRN: [id number] DOB: January 20, 1951   Cancelled Treatment:    Reason Eval/Treat Not Completed: Medical issues which prohibited therapy Pt requiring BiPAP at times.  NP just out of room and states pt not ready for therapy yet (has been somnolent and desaturating).  Will check back as schedule permits   Harlene Ramus 09/24/2021, 10:48 AM Jannette Spanner PT, DPT Acute Rehabilitation Services Pager: [phone] Office: [phone]

## 2021-09-24 NOTE — Progress Notes (Signed)
Rapid response called to bedside for concerns with respiratory/mental status.  Patient alert to self, on 6L O2 oxygen saturations 96%, HR 109 ST, RR 16, BP 133/105. Wheezes in bilateral upper lobes.   Patient refusing to wear bipap currently.  RT in route to administer scheduled breathing treatments.  Attending MD notified and new orders received.

## 2021-09-24 NOTE — Consult Note (Addendum)
Consultation Note Date: 09/24/2021   Patient Name: Kathryn Buckley  DOB: 12/15/1950  MRN: [id number]  Age / Sex: 71 y.o., female  PCP: Debbrah Alar, NP Referring Physician: Flora Lipps, MD  Reason for Consultation: Establishing goals of care  HPI/Patient Profile: 71 y.o. female  with past medical history of severe COPD (2L at home), heart block s/p pacemaker, hypertension, hyperlipidemia, hypothyroidism, anxiety, depression, bipolar disorder, steroid induced hyperglycemia admitted on 09/19/2021 with shortness of breath with acute on chronic COPD exacerbation and community acquired pneumonia. Patient decided DNR 09/21/21. Hospitalization complicated by hypotension requiring fluid bolus, confusion (refusing BiPAP), renal failure.   Clinical Assessment and Goals of Care: I met today at Kathryn Buckley's bedside but no family present. Kathryn Buckley is confused and fluctuates between restlessness and lethargy. She cannot tolerate BiPAP. Kathryn Buckley is unable to participate in goals of care conversation with me today due to the acuity of her illness.   I called and spoke with her sister, Kathryn Buckley. Kathryn Buckley reports that she is not feeling well and unable to visit today. Kathryn Buckley reports that she is the closest family other than other siblings who are older and unable to participate in Kathryn Buckley's plan of care. I had a frank conversation with Kathryn Buckley regarding Kathryn Buckley's concerning condition. I explained that Kathryn Buckley appears tired and miserable and Kathryn Buckley reports that this has been the case for Kathryn Buckley even prior to this acute illness and decline. I explained concern for lungs and build up of carbon dioxide as well as declining kidney function.   We discussed how to best care for Kathryn Buckley at this stage of her life. Kathryn Buckley agrees to discontinue BiPAP and stop trying to make Kathryn Buckley use this knowing that she may continue to decline. Kathryn Buckley  does not wish to escalate care or transfer to ICU but if further decline would rather focus on Kathryn Buckley's comfort and minimizing suffering. We agree to liberalize medication to minimize anxiety, shortness of breath, pain as needed. We will continue conservative measures with antibiotics, nebulizer, steroids for now and reassess the benefits of these measures tomorrow. Family willing and prepared for full comfort care as I explained that I would not be surprised if she continues to decline. I will reassess tomorrow and continue conversation.   All questions/concerns addressed. Emotional support provided.   Primary Decision Maker NEXT OF KIN sister, Kathryn Buckley    SUMMARY OF RECOMMENDATIONS   - DNR - No escalation of care, NO BiPAP, NO tx to ICU/SD - Comfort care if further decline - PRN medications to minimize suffering  Code Status/Advance Care Planning: DNR   Symptom Management:  Anxiety: PRN Ativan.  Shortness of breath/pain: PRN dilaudid.   Palliative Prophylaxis:  Aspiration, Bowel Regimen, Delirium Protocol, Frequent Pain Assessment, Oral Care, and Turn Reposition  Prognosis:  Overall prognosis poor. Very likely to continue to decline.   Discharge Planning: To Be Determined      Primary Diagnoses: Present on Admission:  CAP (community acquired pneumonia)  COPD with acute exacerbation ([place])  Acute on chronic respiratory failure with hypoxia (  HCC)  SOB (shortness of breath)  AKI (acute kidney injury) ([place])  Hyperglycemia  Depression  HTN (hypertension)  GERD (gastroesophageal reflux disease)  Hypothyroid   I have reviewed the medical record, interviewed the patient and family, and examined the patient. The following aspects are pertinent.  Past Medical History:  Diagnosis Date   COPD (chronic obstructive pulmonary disease) (HCC)    Depression    HTN (hypertension)    Hyperlipemia    Seizures (HCC)    Due to brain tumor that was removed in 2004   Thyroid disease     Social History   Socioeconomic History   Marital status: Married    Spouse name: Not on file   Number of children: Not on file   Years of education: Not on file   Highest education level: Not on file  Occupational History   Not on file  Tobacco Use   Smoking status: Every Day    Packs/day: 2.00    Years: 40.00    Pack years: 80.00    Types: Cigarettes    Last attempt to quit: 03/14/2016    Years since quitting: 5.5   Smokeless tobacco: Never  Vaping Use   Vaping Use: Never used  Substance and Sexual Activity   Alcohol use: Yes    Alcohol/week: 2.0 - 3.0 standard drinks    Types: 2 - 3 Cans of beer per week    Comment: Occasional beer   Drug use: No   Sexual activity: Not Currently  Other Topics Concern   Not on file  Social History Narrative   Widowed   and 2 chihuahua   She has 2 children- one son died of drug overdose   1 living son- Kathryn Buckley- lives in [place].     2 step sons   She has worked in the past in Research officer, trade union)   She enjoys TV- likes to be home.         Social Determinants of Health   Financial Resource Strain: Low Risk    Difficulty of Paying Living Expenses: Not hard at all  Food Insecurity: No Food Insecurity   Worried About Charity fundraiser in the Last Year: Never true   [place] in the Last Year: Never true  Transportation Needs: No Transportation Needs   Lack of Transportation (Medical): No   Lack of Transportation (Non-Medical): No  Physical Activity: Inactive   Days of Exercise per Week: 0 days   Minutes of Exercise per Session: 0 min  Stress: Stress Concern Present   Feeling of Stress : To some extent  Social Connections: Socially Isolated   Frequency of Communication with Friends and Family: More than three times a week   Frequency of Social Gatherings with Friends and Family: Twice a week   Attends Religious Services: Never   Marine scientist or Organizations: No   Attends Archivist  Meetings: Never   Marital Status: Widowed   Family History  Problem Relation Age of Onset   Bipolar disorder Father    Heart disease Father    Cancer Father        prostate   Heart disease Mother    Hypertension Brother    Diabetes Brother    Diabetes Mellitus II Sister    Stroke Neg Hx    Hyperlipidemia Neg Hx    Scheduled Meds:  carbamazepine  100 mg Oral BID   chlorhexidine  15 mL Mouth Rinse BID  Chlorhexidine Gluconate Cloth  6 each Topical Daily   diclofenac Sodium  2 g Topical QID   FLUoxetine  40 mg Oral Daily   insulin aspart  0-9 Units Subcutaneous TID WC   ipratropium  0.5 mg Nebulization TID   lactulose  30 g Oral TID   Or   lactulose  300 mL Rectal TID   levalbuterol  1.25 mg Nebulization TID   levothyroxine  125 mcg Oral Q0600   lidocaine  1 patch Transdermal Q24H   mouth rinse  15 mL Mouth Rinse BID   methylPREDNISolone (SOLU-MEDROL) injection  40 mg Intravenous Q12H   midodrine  5 mg Oral TID WC   nicotine  21 mg Transdermal Daily   pantoprazole  40 mg Oral Daily   QUEtiapine  100 mg Oral QHS   sodium bicarbonate  650 mg Oral TID   Continuous Infusions:  sodium chloride Stopped (09/19/21 1532)   cefTRIAXone (ROCEPHIN)  IV 200 mL/hr at 09/24/21 0457    sodium bicarbonate (isotonic) infusion in sterile water 100 mL/hr at 09/24/21 0113   PRN Meds:.sodium chloride, benzonatate, levalbuterol, LORazepam, ondansetron (ZOFRAN) IV, sodium chloride Allergies  Allergen Reactions   Lamictal [Lamotrigine] Rash   Codeine     Other reaction(s): GI Upset (intolerance)   Review of Systems  Unable to perform ROS: Acuity of condition   Physical Exam Vitals and nursing note reviewed.  Constitutional:      Appearance: She is ill-appearing.  Cardiovascular:     Rate and Rhythm: Tachycardia present.  Pulmonary:     Effort: No tachypnea, accessory muscle usage or respiratory distress.  Abdominal:     Palpations: Abdomen is soft.  Neurological:     Mental  Status: She is confused.    Vital Signs: BP (!) 122/93 (BP Location: Left Leg)    Pulse 94    Temp 98.4 F (36.9 C) (Oral)    Resp 17    Ht $R'5\' 9"'zT$  (1.753 m)    Wt 81.1 kg    LMP 09/23/1988    SpO2 94%    BMI 26.40 kg/m  Pain Scale: 0-10 POSS *See Group Information*: S-Acceptable,Sleep, easy to arouse Pain Score: 0-No pain   SpO2: SpO2: 94 % O2 Device:SpO2: 94 % O2 Flow Rate: .O2 Flow Rate (L/min): 4 L/min  IO: Intake/output summary:  Intake/Output Summary (Last 24 hours) at 09/24/2021 1019 Last data filed at 09/24/2021 0900 Gross per 24 hour  Intake 1751.65 ml  Output 604 ml  Net 1147.65 ml    LBM: Last BM Date: 09/23/21 Baseline Weight: Weight: 71.7 kg Most recent weight: Weight: 81.1 kg     Palliative Assessment/Data:     Time In: 1330 Time Out: 1430 Time Total: 60 min Greater than 50%  of this time was spent counseling and coordinating care related to the above assessment and plan.  Signed by: Vinie Sill, NP Palliative Medicine Team Pager # [phone] (M-F 8a-5p) Team Phone # [phone] (Nights/Weekends)

## 2021-09-25 DIAGNOSIS — Z515 Encounter for palliative care: Secondary | ICD-10-CM | POA: Diagnosis not present

## 2021-09-25 DIAGNOSIS — J189 Pneumonia, unspecified organism: Secondary | ICD-10-CM | POA: Diagnosis not present

## 2021-09-25 DIAGNOSIS — J9621 Acute and chronic respiratory failure with hypoxia: Secondary | ICD-10-CM | POA: Diagnosis not present

## 2021-09-25 DIAGNOSIS — Z7189 Other specified counseling: Secondary | ICD-10-CM | POA: Diagnosis not present

## 2021-09-25 DIAGNOSIS — J441 Chronic obstructive pulmonary disease with (acute) exacerbation: Secondary | ICD-10-CM | POA: Diagnosis not present

## 2021-09-25 DIAGNOSIS — N179 Acute kidney failure, unspecified: Secondary | ICD-10-CM | POA: Diagnosis not present

## 2021-09-25 LAB — CULTURE, BLOOD (ROUTINE X 2)
Culture: NO GROWTH
Culture: NO GROWTH
Special Requests: ADEQUATE
Special Requests: ADEQUATE

## 2021-09-25 LAB — GLUCOSE, CAPILLARY
Glucose-Capillary: 208 mg/dL — ABNORMAL HIGH (ref 70–99)
Glucose-Capillary: 191 mg/dL — ABNORMAL HIGH (ref 70–99)
Glucose-Capillary: 154 mg/dL — ABNORMAL HIGH (ref 70–99)
Glucose-Capillary: 105 mg/dL — ABNORMAL HIGH (ref 70–99)

## 2021-09-25 NOTE — Progress Notes (Signed)
Palliative:  HPI: 71 y.o. female  with past medical history of severe COPD (2L at home), heart block s/p pacemaker, hypertension, hyperlipidemia, hypothyroidism, anxiety, depression, bipolar disorder, steroid induced hyperglycemia admitted on 09/19/2021 with shortness of breath with acute on chronic COPD exacerbation and community acquired pneumonia. Patient decided DNR 09/21/21. Hospitalization complicated by hypotension requiring fluid bolus, confusion (refusing BiPAP), renal failure.    I met today at Kathryn Buckley's bedside. She is actually much improved today. She is awake and alert and oriented. She feels better although continues to be weak. Eating breakfast well. I reviewed with her goals of care. She agrees with decisions made by Luellen Pucker and does not wish to pursue BiPAP or escalation to ICU level care. I clarified who she would desire to make decisions on her behalf and she tells me promptly "Luellen Pucker" (She does report having a son but he does not live locally and she relies on [place]). She reports being hopeful that she will continue to improve and agrees with continuing current level of care. She also agrees she would not want to suffer if she declines. She reports that Luellen Pucker will be by later to visit.   All questions/concerns addressed. Emotional support provided.   Exam: Alert, oriented. HR ST. Breathing regular, unlabored but requiring 5-6L nasal cannula. Abd soft, +BM with Lactulose. Moves all extremities.   Plan: - DNR, no escalation to ICU level care. - Surrogate decision maker confirmed to be sister Luellen Pucker.   25 min  Vinie Sill, NP Palliative Medicine Team Pager [phone] (Please see [email or web address] for schedule) Team Phone [phone]    Greater than 50%  of this time was spent counseling and coordinating care related to the above assessment and plan

## 2021-09-25 NOTE — Progress Notes (Signed)
PROGRESS NOTE  Kathryn Buckley HKV:[id number] DOB: May 05, 1951    Brief Narrative / Interim history: 71 year old female with past medical history of COPD, chronic hypoxic respiratory failure on 2 L of oxygen, CKD stage IIIb, complete heart block status post pacemaker placement, hypertension, hypothyroidism, hyperlipidemia, anxiety and depression presented to hospital with progressive shortness of breath, cough, fever chills and myalgia for 2 to 3 weeks.  Patient was initially admitted with acute on chronic hypoxic respiratory failure with community-acquired pneumonia and acute kidney injury on CKD stage IIIb.  Chest x-ray showed a right lower lobe opacity.  Echocardiogram showed LV ejection fraction of 30 to 35% with septal akinesia of.  Patient was started on Lasix but was subsequently discontinued due to AKI.  He also developed hypotension during hospitalization.  Now with metabolic encephalopathy acidosis acute kidney injury hypertension and hyperammonemia.  She was not able to tolerate her BiPAP and was constantly fighting against it.  Palliative care was then consulted for goals of care.  Assessment & Plan:  Acute on chronic respiratory failure with hypoxia:  Likely multifactorial from COPD, pulmonary arterial hypertension, pneumonia and congestive heart failure.  Arterial blood gas analysis showed a mixed respiratory and metabolic acidosis.  Patient was put on BiPAP but was not tolerating much.  Currently on nasal cannula oxygen.  Patient appears to be more stable today.  Initially received IV fluid and bicarbonate.  Will discontinue.  Continue IV Solu-Medrol antibiotics and nebulizers.    RLL pneumonia: Completed 5-day course of Rocephin and Zithromax.  Blood cultures negative in 5 days.  Acute systolic congestive heart failure.   Elevated BNP at 1600 on presentation.  Was initially on IV Lasix but hypotension limited its usage.  We will reconsider oral if creatinine continues to  improve.  AKI/azotemia on CKD-3b:  Creatinine trended up during IV diuretics.  Renal ultrasound showed increased echogenicity.  Continue Foley catheter for now.  Creatinine at 3.4 today and has plateaued.  Creatinine in 1 week back was around 2.2.  We will continue with intake and output charting Daily weights..  Patient is positive balance for 1949 mL.  Weight gain due to fluid.  Acute metabolic encephalopathy:  Thought to be multifactorial from respiratory failure hyperammonemia and medications.  Ativan and Seroquel doses were decreased.  Continue lactulose.  Mentation has improved today.  Patient is more alert awake and communicative.  Hypotension/history of essential hypertension: Seems to have resolved. On midodrine at this time.  Discontinue midodrine today.  Lactic acidosis:  Likely secondary to hypoperfusion from hypotension and CHF.  Improved at this time   History of third-degree AVB/ Has permanent pacemaker.  Anxiety and depression: Ativan and Seroquel has been decreased, appears to be more calm and communicative.  Hypothyroidism Continue Synthroid   Tobacco use disorder:  Was smoking 2 packs a day.  On nicotine patch.  No signs of withdrawal noted.  Nonanion gap metabolic acidosis: Secondary to AKI.  Improved with IV bicarbonate and currently on oral bicarbonate.  Latest CO2 of 23.  We will discontinue  Hyperphosphatemia: Likely due to renal failure.  Check phosphate level in AM.  Elevated liver enzymes: Likely congestive hepatomegaly/ischemic insult in the setting of hypertension.    Acute hepatitis panel negative.  Hold statins.  Continue to monitor LFTs.  Trending down LFTs.  Thrombocytopenia Platelet count of 102K today.  We will continue to monitor. -SCD for VTE prophylaxis  Goal of care: DNR/DNI.   Patient with multiple medical comorbidities and organ failure.  Palliative care has seen the patient.  Plan is no escalation of care no BiPAP or higher level of care.   If patient continues to decline initiation of comfort care.  Currently patient has remained stable.   Subjective: Today, patient was seen and examined at bedside.  Patient's family at bedside.  Appears to have felt better.  States that her breathing is better has mild cough.  Has had a bowel movement.  Objective: Vitals:   09/24/21 1000 09/24/21 1200 09/24/21 1354 09/24/21 1600  BP: 134/89 134/90  (!) 127/91  Pulse: (!) 110 (!) 54  (!) 109  Resp: 18 19  19   Temp:  98.6 F (37 C)  98.6 F (37 C)  TempSrc:  Axillary  Oral  SpO2: (!) 74% 94% 93% 94%  Weight:      Height:        Physical examination: General: Alert awake and communicative.  On nasal cannula oxygen.   HENT:   No scleral pallor or icterus noted. Oral mucosa is moist.  Chest:    Diminished breath sounds bilaterally.   CVS: S1 &S2 heard. No murmur.  Regular rate and rhythm. Abdomen: Soft, nontender, nondistended.  Bowel sounds are heard.  Foley catheter in place. Extremities: No cyanosis, clubbing or edema.  Peripheral pulses are palpable. Psych: Alert, awake and oriented to person and place.   CNS: Alert awake, no focal neurological deficits. Skin: Warm and dry.  No rashes noted.  Procedures:  None.  DVT prophylaxis:  SCDs Start: 09/19/21 1924  Code Status: DNR/DNI.  Family Communication:  Spoke with the patient's family at bedside.   Status is: Inpatient  The patient will remain inpatient because: Acute respiratory failure, AKI,acute metabolic encephalopathy  Consultants:  Palliative medicine  pulmonary verbal consult  Antimicrobials: Rocephin IV, azithromycin completed.   I have personally reviewed the following labs and imaging studies CBC: Recent Labs  Lab 09/19/21 1126 09/20/21 0501 09/21/21 0523 09/21/21 1735 09/22/21 0526 09/23/21 0518 09/24/21 0453  WBC 4.5 2.7* 3.4* 4.4 4.8 4.5 4.1  NEUTROABS 3.1 1.8  --   --   --   --   --   HGB 12.3 11.2* 12.0 11.8* 12.2 11.5* 11.5*  HCT 39.2  36.3 37.9 38.6 40.5 36.4 36.4  MCV 92.7 94.5 93.6 97.0 95.5 92.9 92.6  PLT 122* 114* 125* 113* 108* 99* 102*    BMP &GFR Recent Labs  Lab 09/20/21 0501 09/21/21 0523 09/22/21 0526 09/23/21 0518 09/24/21 0453  NA 140 138 137 137 138  K 4.7 4.2 5.0 4.7 3.6  CL 112* 108 109 104 102  CO2 19* 19* 19* 19* 23  GLUCOSE 118* 168* 162* 160* 194*  BUN 35* 49* 63* 76* 78*  CREATININE 2.26* 2.78* 3.07* 3.41* 3.49*  CALCIUM 8.0* 7.6* 7.0* 6.9* 6.9*  MG 2.1 2.2 2.0 2.2 2.3  PHOS 6.0* 6.6* 7.3* 7.9* 7.7*    Estimated Creatinine Clearance: 16.8 mL/min (A) (by C-G formula based on SCr of 3.49 mg/dL (H)). Liver & Pancreas: Recent Labs  Lab 09/20/21 0501 09/21/21 0523 09/22/21 0526 09/23/21 0518 09/24/21 0453  AST 52* 60* 369* 293* 132*  ALT 64* 98* 364* 441* 334*  ALKPHOS 106 154* 161* 148* 178*  BILITOT 0.7 0.3 0.5 0.5 0.4  PROT 5.7* 6.2* 5.6* 5.8* 5.5*  ALBUMIN 3.4* 3.5   3.5 3.3* 3.4* 3.3*    No results for input(s): LIPASE, AMYLASE in the last 168 hours. Recent Labs  Lab 09/22/21 0933 09/23/21 0518 09/24/21 0981  AMMONIA 60* 58* 63*    Diabetic: No results for input(s): HGBA1C in the last 72 hours. Recent Labs  Lab 09/24/21 1138 09/24/21 1733 09/24/21 2122 09/25/21 0735 09/25/21 1114  GLUCAP 188* 199* 167* 208* 191*    Cardiac Enzymes: Recent Labs  Lab 09/22/21 0526 09/23/21 0518  CKTOTAL 104 123    No results for input(s): PROBNP in the last 8760 hours. Coagulation Profile: Recent Labs  Lab 09/23/21 0518 09/24/21 0453  INR 1.4* 1.3*    Thyroid Function Tests: No results for input(s): TSH, T4TOTAL, FREET4, T3FREE, THYROIDAB in the last 72 hours.  Lipid Profile: No results for input(s): CHOL, HDL, LDLCALC, TRIG, CHOLHDL, LDLDIRECT in the last 72 hours. Anemia Panel: No results for input(s): VITAMINB12, FOLATE, FERRITIN, TIBC, IRON, RETICCTPCT in the last 72 hours. Urine analysis:    Component Value Date/Time   COLORURINE STRAW (A) 09/19/2021  0037   APPEARANCEUR CLEAR 09/19/2021 0037   LABSPEC 1.004 (L) 09/19/2021 0037   PHURINE 5.0 09/19/2021 0037   GLUCOSEU NEGATIVE 09/19/2021 0037   HGBUR SMALL (A) 09/19/2021 0037   BILIRUBINUR NEGATIVE 09/19/2021 0037   BILIRUBINUR neg 07/16/2016 1154   KETONESUR NEGATIVE 09/19/2021 0037   PROTEINUR NEGATIVE 09/19/2021 0037   UROBILINOGEN 1.0 07/16/2016 1154   NITRITE NEGATIVE 09/19/2021 0037   LEUKOCYTESUR NEGATIVE 09/19/2021 0037   Sepsis Labs: Invalid input(s): PROCALCITONIN, [place]  Microbiology: Recent Results (from the past 240 hour(s))  Resp Panel by RT-PCR (Flu A&B, Covid) Nasopharyngeal Swab     Status: None   Collection Time: 09/19/21 11:38 AM   Specimen: Nasopharyngeal Swab; Nasopharyngeal(NP) swabs in vial transport medium  Result Value Ref Range Status   SARS Coronavirus 2 by RT PCR NEGATIVE NEGATIVE Final    Comment: (NOTE) SARS-CoV-2 target nucleic acids are NOT DETECTED.  The SARS-CoV-2 RNA is generally detectable in upper respiratory specimens during the acute phase of infection. The lowest concentration of SARS-CoV-2 viral copies this assay can detect is 138 copies/mL. A negative result does not preclude SARS-Cov-2 infection and should not be used as the sole basis for treatment or other patient management decisions. A negative result may occur with  improper specimen collection/handling, submission of specimen other than nasopharyngeal swab, presence of viral mutation(s) within the areas targeted by this assay, and inadequate number of viral copies(<138 copies/mL). A negative result must be combined with clinical observations, patient history, and epidemiological information. The expected result is Negative.  Fact Sheet for Patients:  EntrepreneurPulse.com.au  Fact Sheet for Healthcare Providers:  IncredibleEmployment.be  This test is no t yet approved or cleared by the [place] FDA and  has been authorized  for detection and/or diagnosis of SARS-CoV-2 by FDA under an Emergency Use Authorization (EUA). This EUA will remain  in effect (meaning this test can be used) for the duration of the COVID-19 declaration under Section 564(b)(1) of the Act, 21 U.S.C.section 360bbb-3(b)(1), unless the authorization is terminated  or revoked sooner.       Influenza A by PCR NEGATIVE NEGATIVE Final   Influenza B by PCR NEGATIVE NEGATIVE Final    Comment: (NOTE) The Xpert Xpress SARS-CoV-2/FLU/RSV plus assay is intended as an aid in the diagnosis of influenza from Nasopharyngeal swab specimens and should not be used as a sole basis for treatment. Nasal washings and aspirates are unacceptable for Xpert Xpress SARS-CoV-2/FLU/RSV testing.  Fact Sheet for Patients: EntrepreneurPulse.com.au  Fact Sheet for Healthcare Providers: IncredibleEmployment.be  This test is not yet approved or cleared by the [place]  FDA and has been authorized for detection and/or diagnosis of SARS-CoV-2 by FDA under an Emergency Use Authorization (EUA). This EUA will remain in effect (meaning this test can be used) for the duration of the COVID-19 declaration under Section 564(b)(1) of the Act, 21 U.S.C. section 360bbb-3(b)(1), unless the authorization is terminated or revoked.  Performed at [facility], [address] [place], [place] [address]   Culture, blood (Routine X 2) w Reflex to ID Panel     Status: None   Collection Time: 09/19/21  8:34 PM   Specimen: Left Antecubital; Blood  Result Value Ref Range Status   Specimen Description   Final    LEFT ANTECUBITAL Performed at [facility] [address] [place], [place] [address]    Special Requests   Final    BOTTLES DRAWN AEROBIC ONLY Blood Culture adequate volume Performed at [facility] [address], [place], [place] [address]    Culture   Final    NO GROWTH 5  DAYS Performed at [facility], [place] [address] [place], [place] [address]    Report Status 09/25/2021 FINAL  Final  Culture, blood (Routine X 2) w Reflex to ID Panel     Status: None   Collection Time: 09/19/21  8:34 PM   Specimen: BLOOD LEFT HAND  Result Value Ref Range Status   Specimen Description   Final    BLOOD LEFT HAND Performed at [facility] [address] [place], [place] [address]    Special Requests   Final    BOTTLES DRAWN AEROBIC ONLY Blood Culture adequate volume Performed at [facility] [address] [place], [place] [address]    Culture   Final    NO GROWTH 5 DAYS Performed at [facility], [place] [address] [place], [place] [address]    Report Status 09/25/2021 FINAL  Final    Radiology Studies: No results found.   Oscar La, MD [facility]  If 7PM-7AM, please contact night-coverage [email or web address] 09/25/2021, 2:35 PM

## 2021-09-25 NOTE — Evaluation (Signed)
Physical Therapy Re-Evaluation Patient Details Name: Kathryn Buckley MRN: [id number] DOB: April 04, 1951 Today's Date: 09/25/2021  History of Present Illness  71 year old female presenting with progressive shortness of breath, productive cough, fever, rigor and myalgia for about 2 to 3 weeks and admitted 09/19/21 for acute on chronic hypoxic respiratory failure in the setting of COPD exacerbation and community-acquired pneumonia, and AKI on CKD-3B.  During this admission, Pt with metabolic encephalopathy acidosis, acute kidney injury, hypertensio,n and hyperammonemia.  She was not able to tolerate her BiPAP and was constantly fighting against it.  Palliative care was then consulted for goals of care.  Pt currently on 6L HFNC and cognition improved 09/25/21 with new PT order.  PMH of COPD/chronic hypoxic RF on 2 L, CKD-3B, CHB/PPM, HTN, hypothyroidism, hyperlipidemia, anxiety, depression and tobacco use disorder (2 PPD)  Clinical Impression  Pt had medical decline for a few days and reordered PT evaluation for 09/25/21.  Pt currently with functional limitations due to the deficits listed below (see PT Problem List). Pt will benefit from skilled PT to increase their independence and safety with mobility to allow discharge to the venue listed below.  Pt eager to get OOB to [facility] and SPO2 remained mostly in upper 90s on 6L HFNC (see mobility section below for details).  Pt fatigued quickly however and assisted back to bed.  Pt conversant and able to answer questions however appears to have limited insight into medical condition at this time.  Pt feels she could d/c home however would currently recommend SNF.  If home, feel pt would need at least min assist for safely mobilizing.       Recommendations for follow up therapy are one component of a multi-disciplinary discharge planning process, led by the attending physician.  Recommendations may be updated based on patient status, additional functional criteria and  insurance authorization.  Follow Up Recommendations Skilled nursing-short term rehab (<3 hours/day)    Assistance Recommended at Discharge Frequent or constant Supervision/Assistance  Patient can return home with the following       Equipment Recommendations None recommended by PT  Recommendations for Other Services       Functional Status Assessment Patient has had a recent decline in their functional status and demonstrates the ability to make significant improvements in function in a reasonable and predictable amount of time.     Precautions / Restrictions Precautions Precautions: Fall Precaution Comments: monitor sats      Mobility  Bed Mobility Overal bed mobility: Needs Assistance Bed Mobility: Supine to Sit;Sit to Supine     Supine to sit: Min assist Sit to supine: Min guard   General bed mobility comments: assist for trunk upright, management of lines    Transfers Overall transfer level: Needs assistance Equipment used: None Transfers: Bed to chair/wheelchair/BSC;Sit to/from Stand Sit to Stand: Min assist   Step pivot transfers: Min assist;+2 safety/equipment       General transfer comment: pt reliant on UEs to self assist, provided HHA or pt utilitzed armrests for transfer, pt BSC <->bed, SPO2 reading mostly upper 90s on 6L HFNC throughout session, briefly into 70s or 80s however pt gripping while this occurred (monitor on finger) and SpO2 immediately improved back into upper 90s, pt denied symptoms    Ambulation/Gait                  Stairs            Wheelchair Mobility    Modified Rankin (Stroke Patients Only)  Balance Overall balance assessment: Mild deficits observed, not formally tested                                           Pertinent Vitals/Pain Pain Assessment: Faces Faces Pain Scale: Hurts little more Pain Location: back Pain Descriptors / Indicators: Aching Pain Intervention(s): Monitored  during session;Repositioned    Home Living Family/patient expects to be discharged to:: Private residence Living Arrangements: Alone Available Help at Discharge: Family;Available PRN/intermittently Type of Home: Apartment Home Access: Level entry       Home Layout: One level Home Equipment: Conservation officer, nature (2 wheels);Cane - single point;Wheelchair - Sport and exercise psychologist Comments: baseline 2L O2 [place], reports apartment is replacing with taller toilet    Prior Function Prior Level of Function : Independent/Modified Independent             Mobility Comments: pt reports ambulating short distance and taking frequent rest breaks for the past couple weeks, family assists with IADLs and driving, reports family just got her a shower seat       Hand Dominance   Dominant Hand: Right    Extremity/Trunk Assessment        Lower Extremity Assessment Lower Extremity Assessment: Generalized weakness    Cervical / Trunk Assessment Cervical / Trunk Assessment: Normal  Communication   Communication: No difficulties  Cognition Arousal/Alertness: Awake/alert Behavior During Therapy: WFL for tasks assessed/performed Overall Cognitive Status: Within Functional Limits for tasks assessed                                 General Comments: pt very happy to be able to get OOB        General Comments      Exercises     Assessment/Plan    PT Assessment Patient needs continued PT services  PT Problem List Decreased mobility;Decreased activity tolerance;Decreased strength;Cardiopulmonary status limiting activity;Decreased balance;Decreased knowledge of use of DME       PT Treatment Interventions Gait training;DME instruction;Therapeutic exercise;Balance training;Functional mobility training;Therapeutic activities;Patient/family education    PT Goals (Current goals can be found in the Care Plan section)  Acute Rehab PT Goals PT Goal Formulation: With  patient Time For Goal Achievement: 10/09/21 Potential to Achieve Goals: Good    Frequency Min 2X/week     Co-evaluation               AM-PAC PT "6 Clicks" Mobility  Outcome Measure Help needed turning from your back to your side while in a flat bed without using bedrails?: A Little Help needed moving from lying on your back to sitting on the side of a flat bed without using bedrails?: A Little Help needed moving to and from a bed to a chair (including a wheelchair)?: A Lot Help needed standing up from a chair using your arms (e.g., wheelchair or bedside chair)?: A Lot Help needed to walk in hospital room?: A Lot Help needed climbing 3-5 steps with a railing? : Total 6 Click Score: 13    End of Session Equipment Utilized During Treatment: Gait belt Activity Tolerance: Patient tolerated treatment well Patient left: with call bell/phone within reach;in bed   PT Visit Diagnosis: Other abnormalities of gait and mobility (R26.89);Muscle weakness (generalized) (M62.81)    Time: 2353-6144 PT Time Calculation (min) (ACUTE ONLY): 21 min  Charges:   PT Evaluation $PT Re-evaluation: 1 Re-eval         Jannette Spanner PT, DPT Acute Rehabilitation Services Pager: [phone] Office: [place] 09/25/2021, 4:23 PM

## 2021-09-25 NOTE — TOC Progression Note (Signed)
Transition of Care [facility]) - Progression Note    Patient Details  Name: Kathryn Buckley MRN: [id number] Date of Birth: 1950/10/02  Transition of [facility]) CM/SW Contact  Ross Ludwig, [place] Phone Number: 09/25/2021, 5:22 PM  Clinical Narrative:    PT recommending SNF, patient family would rather her return back home with home health.  Patient is currently open to [facility], patient also has oxygen at home.  TOC to continue to follow patient's progress throughout discharge planning.   Expected Discharge Plan: [place] Barriers to Discharge: Continued Medical Work up  Expected Discharge Plan and Services Expected Discharge Plan: [place]   Discharge Planning Services: CM Consult Post Acute Care Choice: [employer] arrangements for the past 2 months: [place]: PT [place]: [place] Date [place]: 09/20/21 Time [place]: [phone] Representative spoke with at [place]: [place] ([place]) Interventions    Readmission Risk Interventions No flowsheet data found.

## 2021-09-26 DIAGNOSIS — Z7189 Other specified counseling: Secondary | ICD-10-CM | POA: Diagnosis not present

## 2021-09-26 DIAGNOSIS — Z515 Encounter for palliative care: Secondary | ICD-10-CM | POA: Diagnosis not present

## 2021-09-26 DIAGNOSIS — J9621 Acute and chronic respiratory failure with hypoxia: Secondary | ICD-10-CM | POA: Diagnosis not present

## 2021-09-26 DIAGNOSIS — J441 Chronic obstructive pulmonary disease with (acute) exacerbation: Secondary | ICD-10-CM | POA: Diagnosis not present

## 2021-09-26 DIAGNOSIS — J189 Pneumonia, unspecified organism: Secondary | ICD-10-CM | POA: Diagnosis not present

## 2021-09-26 LAB — COMPREHENSIVE METABOLIC PANEL
ALT: 371 U/L — ABNORMAL HIGH (ref 0–44)
AST: 174 U/L — ABNORMAL HIGH (ref 15–41)
Albumin: 3.4 g/dL — ABNORMAL LOW (ref 3.5–5.0)
Alkaline Phosphatase: 160 U/L — ABNORMAL HIGH (ref 38–126)
Anion gap: 13 (ref 5–15)
BUN: 61 mg/dL — ABNORMAL HIGH (ref 8–23)
CO2: 29 mmol/L (ref 22–32)
Calcium: 7.5 mg/dL — ABNORMAL LOW (ref 8.9–10.3)
Chloride: 101 mmol/L (ref 98–111)
Creatinine, Ser: 2.81 mg/dL — ABNORMAL HIGH (ref 0.44–1.00)
GFR, Estimated: 17 mL/min — ABNORMAL LOW (ref >60)
Glucose, Bld: 222 mg/dL — ABNORMAL HIGH (ref 70–99)
Potassium: 2.9 mmol/L — ABNORMAL LOW (ref 3.5–5.1)
Sodium: 143 mmol/L (ref 135–145)
Total Bilirubin: 0.5 mg/dL (ref 0.3–1.2)
Total Protein: 5.9 g/dL — ABNORMAL LOW (ref 6.5–8.1)

## 2021-09-26 LAB — CBC
HCT: 37.4 % (ref 36.0–46.0)
Hemoglobin: 11.6 g/dL — ABNORMAL LOW (ref 12.0–15.0)
MCH: 28.9 pg (ref 26.0–34.0)
MCHC: 31 g/dL (ref 30.0–36.0)
MCV: 93.3 fL (ref 80.0–100.0)
Platelets: 90 10*3/uL — ABNORMAL LOW (ref 150–400)
RBC: 4.01 MIL/uL (ref 3.87–5.11)
RDW: 17.4 % — ABNORMAL HIGH (ref 11.5–15.5)
WBC: 4.3 10*3/uL (ref 4.0–10.5)
nRBC: 1.2 % — ABNORMAL HIGH (ref 0.0–0.2)

## 2021-09-26 LAB — GLUCOSE, CAPILLARY
Glucose-Capillary: 160 mg/dL — ABNORMAL HIGH (ref 70–99)
Glucose-Capillary: 166 mg/dL — ABNORMAL HIGH (ref 70–99)
Glucose-Capillary: 188 mg/dL — ABNORMAL HIGH (ref 70–99)
Glucose-Capillary: 167 mg/dL — ABNORMAL HIGH (ref 70–99)

## 2021-09-26 LAB — PHOSPHORUS: Phosphorus: 6.1 mg/dL — ABNORMAL HIGH (ref 2.5–4.6)

## 2021-09-26 LAB — AMMONIA: Ammonia: 41 umol/L — ABNORMAL HIGH (ref 9–35)

## 2021-09-26 LAB — MAGNESIUM: Magnesium: 2.3 mg/dL (ref 1.7–2.4)

## 2021-09-26 MED ORDER — LACTULOSE 10 GM/15ML PO SOLN
20.0000 g | Freq: Two times a day (BID) | ORAL | Status: DC
  Administered 2021-09-26 – 2021-09-27 (×2): 20 g via ORAL
  Administered 2021-09-27: 10 g via ORAL
  Administered 2021-09-28 – 2021-10-03 (×10): 20 g via ORAL
  Filled 2021-09-26 (×13): qty 30

## 2021-09-26 MED ORDER — LEVALBUTEROL HCL 1.25 MG/0.5ML IN NEBU
1.2500 mg | INHALATION_SOLUTION | Freq: Four times a day (QID) | RESPIRATORY_TRACT | Status: DC | PRN
  Administered 2021-10-02: 1.25 mg via RESPIRATORY_TRACT
  Filled 2021-09-26: qty 0.5

## 2021-09-26 MED ORDER — POTASSIUM CHLORIDE CRYS ER 20 MEQ PO TBCR
40.0000 meq | EXTENDED_RELEASE_TABLET | Freq: Two times a day (BID) | ORAL | Status: AC
  Administered 2021-09-26 (×2): 40 meq via ORAL
  Filled 2021-09-26 (×3): qty 2

## 2021-09-26 NOTE — Progress Notes (Signed)
Palliative:  HPI: 71 y.o. female  with past medical history of severe COPD (2L at home), heart block s/p pacemaker, hypertension, hyperlipidemia, hypothyroidism, anxiety, depression, bipolar disorder, steroid induced hyperglycemia admitted on 09/19/2021 with shortness of breath with acute on chronic COPD exacerbation and community acquired pneumonia. Patient decided DNR 09/21/21. Hospitalization complicated by hypotension requiring fluid bolus, confusion (refusing BiPAP), renal failure.    I met today with Kathryn Buckley. Kathryn Buckley continues with much improvement. She expresses hope for further improvement and desire to return home to be with her dog, Ruby. RN and I discussed with Kathryn Buckley potential recommendation for rehab and she initially replies that she wants to return home but agrees if PT continues to recommend rehab stay she may consider. She is open to palliative care to follow upon discharge. We discussed HCPOA and she expresses interest to complete as her previously documented HCPOA (her brother's wife) is deceased. She continues to identify sister, Kathryn Buckley, as her Air traffic controller.   Further discussion with RN reporting at least 6 large stools yesterday and plan to hold one dose of lactulose and to decrease dose for now. I explained to Kathryn Buckley that she needs to continue taking lactulose as this is part of what has helped her to improve and she expresses understanding and willingness if we decrease dose. Discussed also with Dr. Louanne Belton.   I attempted to call and update sister, Kathryn Buckley, but unable to reach - not urgent so I did not leave voicemail. Kathryn Buckley is in touch with Kathryn Buckley as well.   All questions/concerns addressed. Emotional support provided.   Exam: Alert, oriented. No distress. Sitting up in recliner. Much improved. HR ST. Breathing regular, unlabored but still on 6L. Abd soft.   Plan: - No change in [place].  - DNR, no escalation to ICU level care. - Surrogate decision maker confirmed  to be sister Kathryn Buckley.  - SNF rehab with palliative vs home with palliative.   25 min  Vinie Sill, NP Palliative Medicine Team Pager [phone] (Please see [email or web address] for schedule) Team Phone [phone]    Greater than 50%  of this time was spent counseling and coordinating care related to the above assessment and plan

## 2021-09-26 NOTE — Progress Notes (Signed)
Chaplain tried to engage in initial visit but Jawanna was peacefully resting.  Chaplain will follow-up.     09/26/21 1200  Clinical Encounter Type  Visited With Patient not available  Visit Type Initial;Social support

## 2021-09-26 NOTE — TOC Progression Note (Signed)
Transition of Care [facility]) - Progression Note    Patient Details  Name: Kathryn Buckley MRN: [id number] Date of Birth: [birth year]  Transition of Care [facility]) CM/SW Contact  Eilam Sh[place], Juliann Pulse, RN Phone Number: 09/26/2021, 2:09 PM  Clinical Narrative:  PT recc SNF-patient declines SNF-prefers home w/HHC-Active w/[facility] rep [facility] aware. Has [employer] home 02-has travel tank;family to transport home on own.     Expected Discharge Plan: [place] Barriers to Discharge: Continued Medical Work up  Expected Discharge Plan and Services Expected Discharge Plan: [place]   Discharge Planning Services: CM Consult Post Acute Care Choice: [place] arrangements for the past 2 months: Single Family Home                           HH Arranged: PT [place]: [place] Date [place]: 09/20/21 Time [place]: [id number] Representative spoke with at [place]: [place] Interventions    Readmission Risk Interventions Readmission Risk Prevention Plan 09/26/2021  Transportation Screening Complete  PCP or Specialist Appt within 3-5 Days Complete  HRI or [place] Complete  Social Work Consult for [place] Planning/Counseling Complete  Palliative Care Screening Not Applicable  Medication Review Press photographer) Complete  Some recent data might be hidden

## 2021-09-26 NOTE — Progress Notes (Signed)
[facility] ([facility]) Hospital Liaison Note  Notified by TOC manager of patient/family request for [facility] palliative services at home after discharge.   [facility] hospital liaison will follow patient for discharge disposition.   Please call with any hospice or outpatient palliative care related questions.   Thank you for the opportunity to participate in this patient's care.   Shanita Wicker, LCSW [facility] Hospital Liaison [phone]  

## 2021-09-26 NOTE — Progress Notes (Addendum)
PROGRESS NOTE  Kathryn Buckley RDE:[id number] DOB: [birth year]    Brief Narrative / Interim history:  71 year old female with past medical history of COPD, chronic hypoxic respiratory failure on 2 L of oxygen, CKD stage IIIb, complete heart block status post pacemaker placement, hypertension, hypothyroidism, hyperlipidemia, anxiety and depression presented to hospital with progressive shortness of breath, cough, fever chills and myalgia for 2 to 3 weeks.  Patient was initially admitted with acute on chronic hypoxic respiratory failure with community-acquired pneumonia and acute kidney injury on CKD stage IIIb.  Chest x-ray showed a right lower lobe opacity.  Echocardiogram showed LV ejection fraction of 30 to 35% with septal akinesia.  Patient was started on Lasix but was subsequently discontinued due to AKI and also developed hypotension during hospitalization.  Now with metabolic encephalopathy, acidosis acute kidney injury, hypertension and hyperammonemia.  She was not able to tolerate her BiPAP and was constantly fighting against it.  Palliative care was then consulted for goals of care.  Assessment & Plan:  Acute on chronic respiratory failure with hypoxia:  Likely multifactorial from COPD, pulmonary arterial hypertension, pneumonia and congestive heart failure.  Arterial blood gas analysis showed a mixed respiratory and metabolic acidosis.  Patient was put on BiPAP but did not tolerate tolerating much.  Currently on nasal cannula oxygen.  Might need oxygen on discharge.  Currently on 6 L of oxygen.  Continue to wean oxygen as able. Patient appears to be more stable today.  Initially received IV fluid and bicarbonate which has been discontinued. Continue IV Solu-Medrol, antibiotics and nebulizers.  Might need oxygen on discharge.  Hypokalemia.  Potassium of 2.9 today.  We will replenish.  Check levels in a.m.  Magnesium of 2.3.  RLL pneumonia: Completed 5-day course of Rocephin and  Zithromax.  Blood cultures negative in 5 days.  Acute systolic congestive heart failure.   Elevated BNP at 1600 on presentation.  Was initially on IV Lasix but hypotension and AKI limited its usage.  We will reconsider oral lasix if creatinine continues to improve.  AKI/azotemia on CKD-3b:  Creatinine trended up during IV diuretics.  Renal ultrasound showed increased echogenicity.  Continue Foley catheter for now.  Creatinine at 2.8 today from 3.4 yesterday.  Creatinine  1 week back was around 2.2.  We will continue with intake and output charting, Daily weights..  Patient is negative balance for 1150 ml at this time.  Acute metabolic encephalopathy:  Thought to be multifactorial from respiratory failure, hyperammonemia and medications.  Ativan and Seroquel doses were decreased.  Continue lactulose.   Mentation has improved today.  Patient is more alert awake and communicative.  Dose of lactulose has been decreased due to diarrhea.  Hypotension on presentation.  Was on midodrine which has been discontinued at this time.  Has history of hypertension at home.  On amlodipine at home.  Could resume amlodipine by tomorrow.  Lactic acidosis:  Likely secondary to hypoperfusion from hypotension and CHF.  Improved at this time   History of third-degree AVB/ Has permanent pacemaker.  No acute issues.  Anxiety and depression: Dose of Ativan and Seroquel has been decreased, appears to be more stable today.  Hypothyroidism Continue Synthroid   Tobacco use disorder:  Was smoking 2 packs a day.  On nicotine patch.  No signs of withdrawal noted.  Nonanion gap metabolic acidosis: Secondary to AKI.  Improved with IV and oral bicarbonate  Latest CO2 of 29.  We will discontinue bicarbonate supplementation.  Hyperphosphatemia: Likely due to  renal failure.  Check phosphate level in AM.  Elevated liver enzymes: Likely congestive hepatomegaly/ischemic insult in the setting of hypotension.    Acute hepatitis  panel negative.  Hold statins.  Continue to monitor LFTs.  Patient did have mild initial trending down of LFTs but has slightly trended up today.  Continue to monitor daily.  Thrombocytopenia Platelet count of 90 K today with downward drift. We will continue to monitor.  No evidence of bleeding at this time. -SCD for VTE prophylaxis  Goal of care: DNR/DNI.   Patient with multiple medical comorbidities and organ failure.  Palliative care has seen the patient.  Plan is no escalation of care no BiPAP or higher level of care.  If patient continues to decline, consider initiation of comfort care but patient has started to improve some.    Debility, weakness.  Physical therapy recommended skilled nursing therapy and placement on discharge.  Disposition.  At this time plan is for skilled nursing facility placement with  Possible palliative care follow-up.  Subjective: Today, patient was seen and examined at bedside.  Patient states that she feels overall well but was unable to sleep in the nighttime and feels weak.  Denies any chest pain, fever, chest pain.  Complains of mild cough.  Patient complains of diarrhea.  Objective: Vitals:   09/24/21 1000 09/24/21 1200 09/24/21 1354 09/24/21 1600  BP: 134/89 134/90  (!) 127/91  Pulse: (!) 110 (!) 54  (!) 109  Resp: 18 19  19   Temp:  98.6 F (37 C)  98.6 F (37 C)  TempSrc:  Axillary  Oral  SpO2: (!) 74% 94% 93% 94%  Weight:      Height:        Physical examination: General:  Average built, not in obvious distress, on nasal cannula oxygen at 6 L/min. HENT:   No scleral pallor or icterus noted. Oral mucosa is moist.  Chest:    Diminished breath sounds bilaterally.  Coarse breath sounds noted. No crackles or wheezes.  CVS: S1 &S2 heard. No murmur.  Regular rate and rhythm. Abdomen: Soft, nontender, nondistended.  Bowel sounds are heard.  Foley catheter in place. Extremities: No cyanosis, clubbing or edema.  Peripheral pulses are  palpable. Psych: Alert, awake and communicative, oriented to person and place. CNS:  No cranial nerve deficits.  Power equal in all extremities.   Skin: Warm and dry.  No rashes noted.  Procedures:  None.  DVT prophylaxis:  SCDs Start: 09/19/21 1924  Code Status: DNR/DNI.  Family Communication:  Spoke with the patient's family at bedside on 09/25/2021.  Status is: Inpatient  The patient will remain inpatient because: Acute respiratory failure, AKI,acute metabolic encephalopathy, need for skilled nursing facility placement.  Consultants:  Palliative medicine  pulmonary -verbal consult  Antimicrobials: Rocephin IV, azithromycin completed.  I have personally reviewed the following labs and imaging studies,  CBC: Recent Labs  Lab 09/19/21 1126 09/20/21 0501 09/21/21 0523 09/21/21 1735 09/22/21 0526 09/23/21 0518 09/24/21 0453 09/26/21 0443  WBC 4.5 2.7*   < > 4.4 4.8 4.5 4.1 4.3  NEUTROABS 3.1 1.8  --   --   --   --   --   --   HGB 12.3 11.2*   < > 11.8* 12.2 11.5* 11.5* 11.6*  HCT 39.2 36.3   < > 38.6 40.5 36.4 36.4 37.4  MCV 92.7 94.5   < > 97.0 95.5 92.9 92.6 93.3  PLT 122* 114*   < > 113* 108* 99*  102* 90*   < > = values in this interval not displayed.    BMP &GFR Recent Labs  Lab 09/21/21 0523 09/22/21 0526 09/23/21 0518 09/24/21 0453 09/26/21 0443  NA 138 137 137 138 143  K 4.2 5.0 4.7 3.6 2.9*  CL 108 109 104 102 101  CO2 19* 19* 19* 23 29  GLUCOSE 168* 162* 160* 194* 222*  BUN 49* 63* 76* 78* 61*  CREATININE 2.78* 3.07* 3.41* 3.49* 2.81*  CALCIUM 7.6* 7.0* 6.9* 6.9* 7.5*  MG 2.2 2.0 2.2 2.3 2.3  PHOS 6.6* 7.3* 7.9* 7.7* 6.1*    Estimated Creatinine Clearance: 21 mL/min (A) (by C-G formula based on SCr of 2.81 mg/dL (H)). Liver & Pancreas: Recent Labs  Lab 09/21/21 0523 09/22/21 0526 09/23/21 0518 09/24/21 0453 09/26/21 0443  AST 60* 369* 293* 132* 174*  ALT 98* 364* 441* 334* 371*  ALKPHOS 154* 161* 148* 178* 160*  BILITOT 0.3 0.5 0.5  0.4 0.5  PROT 6.2* 5.6* 5.8* 5.5* 5.9*  ALBUMIN 3.5   3.5 3.3* 3.4* 3.3* 3.4*    No results for input(s): LIPASE, AMYLASE in the last 168 hours. Recent Labs  Lab 09/22/21 0933 09/23/21 0518 09/24/21 0453 09/26/21 0443  AMMONIA 60* 58* 63* 41*    Diabetic: No results for input(s): HGBA1C in the last 72 hours. Recent Labs  Lab 09/25/21 0735 09/25/21 1114 09/25/21 1621 09/25/21 2106 09/26/21 0731  GLUCAP 208* 191* 154* 105* 160*    Cardiac Enzymes: Recent Labs  Lab 09/22/21 0526 09/23/21 0518  CKTOTAL 104 123    No results for input(s): PROBNP in the last 8760 hours. Coagulation Profile: Recent Labs  Lab 09/23/21 0518 09/24/21 0453  INR 1.4* 1.3*    Thyroid Function Tests: No results for input(s): TSH, T4TOTAL, FREET4, T3FREE, THYROIDAB in the last 72 hours.  Lipid Profile: No results for input(s): CHOL, HDL, LDLCALC, TRIG, CHOLHDL, LDLDIRECT in the last 72 hours. Anemia Panel: No results for input(s): VITAMINB12, FOLATE, FERRITIN, TIBC, IRON, RETICCTPCT in the last 72 hours. Urine analysis:    Component Value Date/Time   COLORURINE STRAW (A) 09/19/2021 0037   APPEARANCEUR CLEAR 09/19/2021 0037   LABSPEC 1.004 (L) 09/19/2021 0037   PHURINE 5.0 09/19/2021 0037   GLUCOSEU NEGATIVE 09/19/2021 0037   HGBUR SMALL (A) 09/19/2021 0037   BILIRUBINUR NEGATIVE 09/19/2021 0037   BILIRUBINUR neg 07/16/2016 1154   KETONESUR NEGATIVE 09/19/2021 0037   PROTEINUR NEGATIVE 09/19/2021 0037   UROBILINOGEN 1.0 07/16/2016 1154   NITRITE NEGATIVE 09/19/2021 0037   LEUKOCYTESUR NEGATIVE 09/19/2021 0037   Sepsis Labs: Invalid input(s): PROCALCITONIN, [place]  Microbiology: Recent Results (from the past 240 hour(s))  Resp Panel by RT-PCR (Flu A&B, Covid) Nasopharyngeal Swab     Status: None   Collection Time: 09/19/21 11:38 AM   Specimen: Nasopharyngeal Swab; Nasopharyngeal(NP) swabs in vial transport medium  Result Value Ref Range Status   SARS Coronavirus 2 by  RT PCR NEGATIVE NEGATIVE Final    Comment: (NOTE) SARS-CoV-2 target nucleic acids are NOT DETECTED.  The SARS-CoV-2 RNA is generally detectable in upper respiratory specimens during the acute phase of infection. The lowest concentration of SARS-CoV-2 viral copies this assay can detect is 138 copies/mL. A negative result does not preclude SARS-Cov-2 infection and should not be used as the sole basis for treatment or other patient management decisions. A negative result may occur with  improper specimen collection/handling, submission of specimen other than nasopharyngeal swab, presence of viral mutation(s) within the areas targeted  by this assay, and inadequate number of viral copies(<138 copies/mL). A negative result must be combined with clinical observations, patient history, and epidemiological information. The expected result is Negative.  Fact Sheet for Patients:  EntrepreneurPulse.com.au  Fact Sheet for Healthcare Providers:  IncredibleEmployment.be  This test is no t yet approved or cleared by the [place] FDA and  has been authorized for detection and/or diagnosis of SARS-CoV-2 by FDA under an Emergency Use Authorization (EUA). This EUA will remain  in effect (meaning this test can be used) for the duration of the COVID-19 declaration under Section 564(b)(1) of the Act, 21 U.S.C.section 360bbb-3(b)(1), unless the authorization is terminated  or revoked sooner.       Influenza A by PCR NEGATIVE NEGATIVE Final   Influenza B by PCR NEGATIVE NEGATIVE Final    Comment: (NOTE) The Xpert Xpress SARS-CoV-2/FLU/RSV plus assay is intended as an aid in the diagnosis of influenza from Nasopharyngeal swab specimens and should not be used as a sole basis for treatment. Nasal washings and aspirates are unacceptable for Xpert Xpress SARS-CoV-2/FLU/RSV testing.  Fact Sheet for Patients: EntrepreneurPulse.com.au  Fact Sheet  for Healthcare Providers: IncredibleEmployment.be  This test is not yet approved or cleared by the [place] FDA and has been authorized for detection and/or diagnosis of SARS-CoV-2 by FDA under an Emergency Use Authorization (EUA). This EUA will remain in effect (meaning this test can be used) for the duration of the COVID-19 declaration under Section 564(b)(1) of the Act, 21 U.S.C. section 360bbb-3(b)(1), unless the authorization is terminated or revoked.  Performed at [facility], [address] [place], [place] [address]   Culture, blood (Routine X 2) w Reflex to ID Panel     Status: None   Collection Time: 09/19/21  8:34 PM   Specimen: Left Antecubital; Blood  Result Value Ref Range Status   Specimen Description   Final    LEFT ANTECUBITAL Performed at [facility] [address] [place], [place] [address]    Special Requests   Final    BOTTLES DRAWN AEROBIC ONLY Blood Culture adequate volume Performed at [facility] [address], [place], [place] [address]    Culture   Final    NO GROWTH 5 DAYS Performed at [facility], [place] [address] [place], [place] [address]    Report Status 09/25/2021 FINAL  Final  Culture, blood (Routine X 2) w Reflex to ID Panel     Status: None   Collection Time: 09/19/21  8:34 PM   Specimen: BLOOD LEFT HAND  Result Value Ref Range Status   Specimen Description   Final    BLOOD LEFT HAND Performed at [facility] [address], [place], [place] [address]    Special Requests   Final    BOTTLES DRAWN AEROBIC ONLY Blood Culture adequate volume Performed at [facility] [address] [place], [place] [address]    Culture   Final    NO GROWTH 5 DAYS Performed at [facility], [place] [address] [place], [place] [address]    Report Status 09/25/2021 FINAL  Final    Radiology Studies: No results  found.   Oscar La, MD [facility] If 7PM-7AM, please contact night-coverage 09/26/2021, 7:49 AM

## 2021-09-27 DIAGNOSIS — I5021 Acute systolic (congestive) heart failure: Secondary | ICD-10-CM | POA: Diagnosis not present

## 2021-09-27 DIAGNOSIS — J441 Chronic obstructive pulmonary disease with (acute) exacerbation: Secondary | ICD-10-CM | POA: Diagnosis not present

## 2021-09-27 DIAGNOSIS — N179 Acute kidney failure, unspecified: Secondary | ICD-10-CM | POA: Diagnosis not present

## 2021-09-27 DIAGNOSIS — Z515 Encounter for palliative care: Secondary | ICD-10-CM | POA: Diagnosis not present

## 2021-09-27 DIAGNOSIS — Z7189 Other specified counseling: Secondary | ICD-10-CM | POA: Diagnosis not present

## 2021-09-27 DIAGNOSIS — J9621 Acute and chronic respiratory failure with hypoxia: Secondary | ICD-10-CM | POA: Diagnosis not present

## 2021-09-27 DIAGNOSIS — J189 Pneumonia, unspecified organism: Secondary | ICD-10-CM | POA: Diagnosis not present

## 2021-09-27 LAB — CBC
HCT: 38.6 % (ref 36.0–46.0)
Hemoglobin: 11.8 g/dL — ABNORMAL LOW (ref 12.0–15.0)
MCH: 29.1 pg (ref 26.0–34.0)
MCHC: 30.6 g/dL (ref 30.0–36.0)
MCV: 95.1 fL (ref 80.0–100.0)
Platelets: 95 10*3/uL — ABNORMAL LOW (ref 150–400)
RBC: 4.06 MIL/uL (ref 3.87–5.11)
RDW: 17.6 % — ABNORMAL HIGH (ref 11.5–15.5)
WBC: 5.4 10*3/uL (ref 4.0–10.5)
nRBC: 0.7 % — ABNORMAL HIGH (ref 0.0–0.2)

## 2021-09-27 LAB — COMPREHENSIVE METABOLIC PANEL
ALT: 327 U/L — ABNORMAL HIGH (ref 0–44)
AST: 102 U/L — ABNORMAL HIGH (ref 15–41)
Albumin: 3.3 g/dL — ABNORMAL LOW (ref 3.5–5.0)
Alkaline Phosphatase: 154 U/L — ABNORMAL HIGH (ref 38–126)
Anion gap: 12 (ref 5–15)
BUN: 55 mg/dL — ABNORMAL HIGH (ref 8–23)
CO2: 26 mmol/L (ref 22–32)
Calcium: 7.7 mg/dL — ABNORMAL LOW (ref 8.9–10.3)
Chloride: 103 mmol/L (ref 98–111)
Creatinine, Ser: 2.52 mg/dL — ABNORMAL HIGH (ref 0.44–1.00)
GFR, Estimated: 20 mL/min — ABNORMAL LOW (ref >60)
Glucose, Bld: 221 mg/dL — ABNORMAL HIGH (ref 70–99)
Potassium: 3.4 mmol/L — ABNORMAL LOW (ref 3.5–5.1)
Sodium: 141 mmol/L (ref 135–145)
Total Bilirubin: 0.6 mg/dL (ref 0.3–1.2)
Total Protein: 5.6 g/dL — ABNORMAL LOW (ref 6.5–8.1)

## 2021-09-27 LAB — PHOSPHORUS: Phosphorus: 4.7 mg/dL — ABNORMAL HIGH (ref 2.5–4.6)

## 2021-09-27 LAB — GLUCOSE, CAPILLARY
Glucose-Capillary: 157 mg/dL — ABNORMAL HIGH (ref 70–99)
Glucose-Capillary: 162 mg/dL — ABNORMAL HIGH (ref 70–99)
Glucose-Capillary: 175 mg/dL — ABNORMAL HIGH (ref 70–99)
Glucose-Capillary: 182 mg/dL — ABNORMAL HIGH (ref 70–99)

## 2021-09-27 LAB — MAGNESIUM: Magnesium: 2.3 mg/dL (ref 1.7–2.4)

## 2021-09-27 NOTE — TOC Transition Note (Signed)
Transition of Care [facility]) - CM/SW Discharge Note   Patient Details  Name: Kathryn Buckley MRN: [id number] Date of Birth: [birth year]  Transition of Care [facility]) CM/SW Contact:  Dessa Phi, RN Phone Number: 09/27/2021, 3:01 PM   Clinical Narrative:  [facility] HHPT-rep Tommi Rumps already active & aware of d/c n am. [facility]-ptpt PCS already set up. Has own home 02-[employer], & travel tank,own transportation home. No further CM needs.     Final next level of care: [place] Barriers to Discharge: No Barriers Identified   Patient Goals and CMS Choice Patient states their goals for this hospitalization and ongoing recovery are:: go home CMS Medicare.gov Compare Post Acute Care list provided to:: Patient Represenative (must comment) Choice offered to / list presented to : Sibling  Discharge Placement                       Discharge Plan and Services   Discharge Planning Services: CM Consult Post Acute Care Choice: Home Health                    HH Arranged: PT [place]: [place] Date [place]: 09/20/21 Time [place]: 2162 Representative spoke with at [place]: [place]) Interventions     Readmission Risk Interventions Readmission Risk Prevention Plan 09/26/2021  Transportation Screening Complete  PCP or Specialist Appt within 3-5 Days Complete  HRI or [place] Complete  Social Work Consult for [place] Planning/Counseling Complete  Palliative Care Screening Not Applicable  Medication Review Press photographer) Complete  Some recent data might be hidden

## 2021-09-27 NOTE — Care Management Important Message (Signed)
Important Message  Patient Details IM Letter placed in Patients room. Name: Kathryn Buckley MRN: [id number] Date of Birth: 05/12/1951   Medicare Important Message Given:  Yes     Kerin Salen 09/27/2021, 3:30 PM

## 2021-09-27 NOTE — Progress Notes (Signed)
Palliative:  HPI: 71 y.o. female  with past medical history of severe COPD (2L at home), heart block s/p pacemaker, hypertension, hyperlipidemia, hypothyroidism, anxiety, depression, bipolar disorder, steroid induced hyperglycemia admitted on 09/19/2021 with shortness of breath with acute on chronic COPD exacerbation and community acquired pneumonia. Patient decided DNR 09/21/21. Hospitalization complicated by hypotension requiring fluid bolus, confusion (refusing BiPAP), renal failure.    I met today with Olar. She is more confused and restless today. She continues to be eating well. Continues to require 6L oxygen but more short of breath with activity. She is much declined from yesterday.   I called and spoke with her sister, Luellen Pucker. I explained that Rosi was doing very well yesterday and I was encouraging her to pursue rehab stay. However, she has declined today. Potentially due to benzo + COPD and could have some underlying CO2 buildup. Goals remain unchanged for DNR and no escalation of care but continue current interventions. Luellen Pucker also does not want to withhold medication for anxiety relief. Luellen Pucker is open to comfort if indicated and looking for guidance. She does anticipate that Mckennah will  NOT be able to return home where she lives alone. I did explain that we can see how Shakenya progresses since she did seem so good yesterday. Luellen Pucker agrees that Tattianna will declare herself and if further decline comfort care would be appropriate.   All questions/concerns addressed. Emotional support provided.   Exam: Confused. Alert. Short of breath on 6L. Sitting up in recliner but restless and asking to return to bed.   Plan: - DNR - No BiPAP, NO ICU tx - Continue current interventions while also providing symptom relief - Comfort care if she continues to decline - Multiple stools and recommend down titration of lactulose  25 min  Vinie Sill, NP Palliative Medicine Team Pager  [phone] (Please see [email or web address] for schedule) Team Phone [phone]    Greater than 50%  of this time was spent counseling and coordinating care related to the above assessment and plan

## 2021-09-27 NOTE — Progress Notes (Signed)
°  Progress Note   Patient: Kathryn Buckley WHQ:[id number] DOB: 10/12/1950 DOA: 09/19/2021     7 DOS: the patient was seen and examined on 09/27/2021   Brief hospital course: 71 year old woman PMH COPD on chronic oxygen presenting with shortness of breath.  Admitted for acute on chronic hypoxic respiratory failure, pneumonia, acute kidney injury.  Echocardiogram revealed LVEF 30-35%, patient did not tolerate Lasix developing AKI.  Hospitalization complicated by metabolic encephalopathy acute kidney injury, hyperammonemia.  Unable to tolerate BiPAP.  Assessment and Plan Acute on chronic respiratory failure with hypoxia:  -- Currently on 6 L, multifactorial COPD, pulmonary artery hypertension, CHF.  Patient unable to tolerate BiPAP.   Hypokalemia. -- Replete   RLL lobar pneumonia --Completed Zithromax, Rocephin.     Acute systolic congestive heart failure.   --Treated with Lasix but developed hypotension and AKI.  Could consider advanced heart failure consultation, however given goals of care do not think this is indicated.   AKI superimposed on CKD stage IIIb --Secondary to IV diuretics.  Creatinine trended up during IV diuretics. -- Creatinine slowly improving.  Recheck in a.m.   Acute metabolic encephalopathy:  --Multifactorial including respiratory failure, hyperammonemia, consider medications Ativan and Seroquel.  Doses were decreased.  Continue lactulose. --Awake and alert.   Anxiety and depression --Dose of Ativan and Seroquel has been decreased.  Stable   Hypothyroidism --Continue Synthroid    Tobacco use disorder:  --Was smoking 2 packs a day.  On nicotine patch.     Elevated liver enzymes  --Likely congestive hepatomegaly/ischemic insult in the setting of hypotension.    Acute hepatitis panel negative.  Hold statin --trending down   Thrombocytopenia --stable, etiology unclear, follow  History of third-degree AVB --Status post permanent pacemaker.     Goal of  care: DNR/DNI.   --Patient with multiple medical comorbidities and organ failure. Plan is no escalation of care no BiPAP or higher level of care.     Subjective:  Feels a little better today Breathing ok Nausea gone after eating some crackers  Objective Vital signs were reviewed and unremarkable. Physical Exam Vitals reviewed.  Constitutional:      General: She is not in acute distress.    Appearance: She is ill-appearing. She is not toxic-appearing.  Cardiovascular:     Rate and Rhythm: Normal rate and regular rhythm.     Heart sounds: No murmur heard.    Comments: Telemetry paced rhythm Pulmonary:     Breath sounds: No wheezing, rhonchi or rales.     Comments: Mild to moderate increased respiratory effort Musculoskeletal:     Right lower leg: No edema.     Left lower leg: No edema.  Neurological:     Mental Status: She is alert.  Psychiatric:        Mood and Affect: Mood normal.        Behavior: Behavior normal.     Data Reviewed:  K+ 3.4, creatinine down to 2.52, BUN down to 55, AST and ALT trending down, Hgb stable 11.8, plts stable 95  Family Communication: none  Disposition: Status is: Inpatient  Remains inpatient appropriate because: tenuous resp status         Time spent: 35 minutes  Author: Murray Hodgkins, MD 09/27/2021 7:25 PM  For on call review [email or web address].

## 2021-09-27 NOTE — Hospital Course (Addendum)
71 year old woman COPD admitted for acute on chronic hypoxic respiratory failure secondary to COPD exacerbation, pneumonia and acute systolic CHF.  Complicated hospitalization, did not do well with Lasix but gradually improved with treatment for COPD and pneumonia.  Continues to have high oxygen requirement 4-6 L but appears to be stabilized.  Palliative care following.  No escalation of care.  Was adamant about going home, but now agreeable to SNF. Plan discharge when has a bed.  Acute on chronic respiratory failure with hypoxia superimposed on COPD:  -- Down to 4-6 L.  Appears stable.  Multifactorial COPD, pulmonary artery hypertension, CHF.  Patient unable to tolerate BiPAP. -- SNF when bed available   Hypokalemia. -- Resolved   RLL lobar pneumonia --Completed Zithromax, Rocephin.  Resolved   Acute systolic congestive heart failure.   --Treated with Lasix but developed hypotension and AKI.  --Follow volume status.  Fluid balance is negative.  No evidence of volume overload.   AKI superimposed on CKD stage IIIb --Secondary to IV diuretics.  Creatinine trended up during IV diuretics. -- Creatinine stable. Follow-up as an outpatient.   Acute metabolic encephalopathy:  --Multifactorial including respiratory failure, hyperammonemia, consider medications Ativan and Seroquel.  Doses were decreased.  Continue lactulose. --Awake and alert and mentation appears stable.   Anxiety and depression --Dose of Ativan and Seroquel has been decreased.  Stable   Hypothyroidism --Continue Synthroid    Tobacco use disorder:  --Was smoking 2 packs a day.  On nicotine patch.     Elevated liver enzymes  --Likely congestive hepatomegaly/ischemic insult in the setting of hypotension.    Acute hepatitis panel negative.  Hold statin -- follow-up as an outpatient   Thrombocytopenia --stable, etiology unclear, follow up as outpatient   History of third-degree AVB --Status post permanent pacemaker.      Goal of care: DNR/DNI.   --Patient with multiple medical comorbidities and organ failure. Plan is no escalation of care no BiPAP or higher level of care.

## 2021-09-27 NOTE — Consult Note (Signed)
° °  [facility] Inpatient Consult   09/27/2021  CHERELL COLVIN 1950/11/04 [id number]  [facility] Liaison coverage for [place] [facility] [ACO] Patient: [place] Medicare  Primary Care Provider:  Debbrah Alar, NP,  is an embedded provider with a Chronic Care Management team and program, and is listed for the transition of care follow up and appointments.  Patient was screened for Embedded practice service needs for chronic care management and patient is being recommended for SNF rehab verse home with Palliative Care follow up [ACP] noted.  Also reviewed Palliative consult.  Plan: A referral can be sent to the Embedded Chronic Care Management team for [facility] needs for post hospital pending on final disposition. [facility] RN Liaison to continue to follow.  Please contact for further questions,  Natividad Brood, RN BSN [facility] Liaison  [phone] business mobile phone Toll free office [phone]  Fax number: [phone] [email or web address]

## 2021-09-28 DIAGNOSIS — J441 Chronic obstructive pulmonary disease with (acute) exacerbation: Secondary | ICD-10-CM | POA: Diagnosis not present

## 2021-09-28 DIAGNOSIS — Z515 Encounter for palliative care: Secondary | ICD-10-CM

## 2021-09-28 DIAGNOSIS — Z7189 Other specified counseling: Secondary | ICD-10-CM | POA: Diagnosis not present

## 2021-09-28 DIAGNOSIS — G9341 Metabolic encephalopathy: Secondary | ICD-10-CM | POA: Diagnosis not present

## 2021-09-28 DIAGNOSIS — R0602 Shortness of breath: Secondary | ICD-10-CM

## 2021-09-28 DIAGNOSIS — J9621 Acute and chronic respiratory failure with hypoxia: Secondary | ICD-10-CM

## 2021-09-28 LAB — GLUCOSE, CAPILLARY
Glucose-Capillary: 127 mg/dL — ABNORMAL HIGH (ref 70–99)
Glucose-Capillary: 182 mg/dL — ABNORMAL HIGH (ref 70–99)
Glucose-Capillary: 132 mg/dL — ABNORMAL HIGH (ref 70–99)
Glucose-Capillary: 184 mg/dL — ABNORMAL HIGH (ref 70–99)

## 2021-09-28 MED ORDER — METHYLPREDNISOLONE SODIUM SUCC 40 MG IJ SOLR
40.0000 mg | INTRAMUSCULAR | Status: DC
  Administered 2021-09-29: 40 mg via INTRAVENOUS
  Filled 2021-09-28 (×2): qty 1

## 2021-09-28 NOTE — TOC Progression Note (Signed)
Transition of Care [facility]) - Progression Note    Patient Details  Name: Kathryn Buckley MRN: [id number] Date of Birth: 1951-08-28  Transition of Care [facility]) CM/SW Contact  Kamiah Fite, Juliann Pulse, RN Phone Number: 09/28/2021, 1:29 PM  Clinical Narrative: Noted medical condition & changes.Informed Luellen Pucker sister we are following to provide best transition to next level of care when appropriate-voiced understanding.      Expected Discharge Plan: [place] Barriers to Discharge: No Barriers Identified  Expected Discharge Plan and Services Expected Discharge Plan: [place]   Discharge Planning Services: CM Consult Post Acute Care Choice: [place] arrangements for the past 2 months: Single Family Home                           HH Arranged: PT [place]: [place] Date [place]: 09/20/21 Time [place]: [id number] Representative spoke with at [place]: [place] ([place]) Interventions    Readmission Risk Interventions Readmission Risk Prevention Plan 09/26/2021  Transportation Screening Complete  PCP or Specialist Appt within 3-5 Days Complete  HRI or [place] Complete  Social Work Consult for [place] Planning/Counseling Complete  Palliative Care Screening Not Applicable  Medication Review Press photographer) Complete  Some recent data might be hidden

## 2021-09-28 NOTE — Progress Notes (Signed)
Manufacturing engineer [facility]) Hospital Liaison Note   Notified by [facility] manager of patient/family request for [facility] palliative services at home after discharge.    [facility] hospital liaison will follow patient for discharge disposition.    Please call with any hospice or outpatient palliative care related questions.    If you have questions or need assistance, please call [phone] or contact the hospital Liaison listed on AMION.      Farrel Gordon, RN, [facility] Liaison

## 2021-09-28 NOTE — Progress Notes (Signed)
Physical Therapy Treatment Patient Details Name: Kathryn Buckley MRN: [id number] DOB: 07/25/1951 Today's Date: 09/28/2021   History of Present Illness 71 year old female presenting with progressive shortness of breath, productive cough, fever, rigor and myalgia for about 2 to 3 weeks and admitted 09/19/21 for acute on chronic hypoxic respiratory failure in the setting of COPD exacerbation and community-acquired pneumonia, and AKI on CKD-3B.  During this admission, Pt with metabolic encephalopathy acidosis, acute kidney injury, hypertensio,n and hyperammonemia.  She was not able to tolerate her BiPAP and was constantly fighting against it.  Palliative care was then consulted for goals of care.  Pt currently on 6L HFNC and cognition improved 09/25/21 with new PT order.  PMH of COPD/chronic hypoxic RF on 2 L, CKD-3B, CHB/PPM, HTN, hypothyroidism, hyperlipidemia, anxiety, depression and tobacco use disorder (2 PPD)    PT Comments    Pt tolerated increased activity level, she ambulated 90' with RW, SpO2 86% on 8L immediately after walking, HR 115, SpO2 95% on 8L O2 at rest. Pt is adamant about going home. Her sister reports family can check in on her, but cannot provide 24/7 assistance.     Recommendations for follow up therapy are one component of a multi-disciplinary discharge planning process, led by the attending physician.  Recommendations may be updated based on patient status, additional functional criteria and insurance authorization.  Follow Up Recommendations  Home health PT     Assistance Recommended at Discharge Intermittent Supervision/Assistance  Patient can return home with the following A little help with bathing/dressing/bathroom;A little help with walking and/or transfers;Help with stairs or ramp for entrance;Assist for transportation   Equipment Recommendations  None recommended by PT    Recommendations for Other Services       Precautions / Restrictions  Precautions Precautions: Fall Precaution Comments: monitor sats Restrictions Weight Bearing Restrictions: No     Mobility  Bed Mobility Overal bed mobility: Modified Independent Bed Mobility: Supine to Sit;Sit to Supine     Supine to sit: Modified independent (Device/Increase time);HOB elevated Sit to supine: Modified independent (Device/Increase time);HOB elevated   General bed mobility comments: used bedrail    Transfers Overall transfer level: Needs assistance Equipment used: Rolling walker (2 wheels) Transfers: Sit to/from Stand Sit to Stand: Min assist           General transfer comment: Min A to power up    Ambulation/Gait Ambulation/Gait assistance: Min guard Gait Distance (Feet): 90 Feet Assistive device: Rolling walker (2 wheels) Gait Pattern/deviations: Step-through pattern;Decreased stride length Gait velocity: WFL     General Gait Details: 65' with RW on 8L O2, SpO2 86% on 8L while walking, 95% on 8L at rest prior to activity, HR 115 walking   Stairs             Wheelchair Mobility    Modified Rankin (Stroke Patients Only)       Balance Overall balance assessment: Mild deficits observed, not formally tested                                          Cognition Arousal/Alertness: Awake/alert Behavior During Therapy: Flat affect Overall Cognitive Status: Within Functional Limits for tasks assessed  Exercises      General Comments        Pertinent Vitals/Pain Pain Assessment: No/denies pain    Home Living                          Prior Function            PT Goals (current goals can now be found in the care plan section) Acute Rehab PT Goals PT Goal Formulation: With patient Time For Goal Achievement: 10/09/21 Potential to Achieve Goals: Good Progress towards PT goals: Progressing toward goals    Frequency    Min 3X/week       PT Plan Discharge plan needs to be updated (SNF recommended, pt refusing, will increase frequency to 3x/week to facilitate DC home)    Co-evaluation              AM-PAC PT "6 Clicks" Mobility   Outcome Measure  Help needed turning from your back to your side while in a flat bed without using bedrails?: None Help needed moving from lying on your back to sitting on the side of a flat bed without using bedrails?: A Little Help needed moving to and from a bed to a chair (including a wheelchair)?: A Little Help needed standing up from a chair using your arms (e.g., wheelchair or bedside chair)?: A Little Help needed to walk in hospital room?: A Little Help needed climbing 3-5 steps with a railing? : A Lot 6 Click Score: 18    End of Session Equipment Utilized During Treatment: Gait belt;Oxygen Activity Tolerance: Patient tolerated treatment well Patient left: with call bell/phone within reach;in bed;with bed alarm set Nurse Communication: Mobility status PT Visit Diagnosis: Other abnormalities of gait and mobility (R26.89);Muscle weakness (generalized) (M62.81)     Time: 7846-9629 PT Time Calculation (min) (ACUTE ONLY): 17 min  Charges:  $Gait Training: 8-22 mins                    Blondell Reveal Kistler PT 09/28/2021  Acute Rehabilitation Services Pager [phone] Office [phone]

## 2021-09-28 NOTE — Progress Notes (Signed)
°  Progress Note   Patient: Kathryn Buckley [id number]:[id number] DOB: 13-Feb-1951 DOA: 09/19/2021     8 DOS: the patient was seen and examined on 09/28/2021   Brief hospital course: 71 year old woman COPD admitted for acute on chronic hypoxic respiratory failure secondary to COPD exacerbation, pneumonia and acute systolic CHF.  Complicated hospitalization, did not do well with Lasix.  Palliative care following.  No escalation of care.  Still requiring 8 L nasal cannula.  Lives alone, confused, disposition and.  Acute on chronic respiratory failure with hypoxia:  -- Currently on 8 L, multifactorial COPD, pulmonary artery hypertension, CHF.  Patient unable to tolerate BiPAP. --Appears clinically unchanged from yesterday.   Hypokalemia. -- Repleted, repeat BMP in a.m.   RLL lobar pneumonia --Completed Zithromax, Rocephin.     Acute systolic congestive heart failure.   --Treated with Lasix but developed hypotension and AKI.  Could consider advanced heart failure consultation, however given goals of care do not think this is indicated. --Follow volume status   AKI superimposed on CKD stage IIIb --Secondary to IV diuretics.  Creatinine trended up during IV diuretics. -- Creatinine slowly improving.  Recheck in a.m.   Acute metabolic encephalopathy:  --Multifactorial including respiratory failure, hyperammonemia, consider medications Ativan and Seroquel.  Doses were decreased.  Continue lactulose. --Awake and alert but mentation odd today.   Anxiety and depression --Dose of Ativan and Seroquel has been decreased.  Stable   Hypothyroidism --Continue Synthroid    Tobacco use disorder:  --Was smoking 2 packs a day.  On nicotine patch.     Elevated liver enzymes  --Likely congestive hepatomegaly/ischemic insult in the setting of hypotension.    Acute hepatitis panel negative.  Hold statin --trending down   Thrombocytopenia --stable, etiology unclear, follow   History of third-degree  AVB --Status post permanent pacemaker.     Goal of care: DNR/DNI.   --Patient with multiple medical comorbidities and organ failure. Plan is no escalation of care no BiPAP or higher level of care.      Subjective:  Feels okay today, breathing little better  Objective Vital signs were reviewed and unremarkable. Physical Exam Constitutional:      General: She is not in acute distress.    Appearance: She is ill-appearing. She is not toxic-appearing.  Cardiovascular:     Rate and Rhythm: Normal rate and regular rhythm.     Heart sounds: No murmur heard. Pulmonary:     Effort: No respiratory distress.     Breath sounds: No wheezing or rales.     Comments: Mild increased respiratory effort Musculoskeletal:     Right lower leg: No edema.     Left lower leg: No edema.  Neurological:     Mental Status: She is alert.  Psychiatric:     Comments: Odd behavior, confused     Data Reviewed:  There are no new results to review at this time.  Family Communication: none  Disposition: Status is: Inpatient  Remains inpatient appropriate because: Hypoxic respiratory failure requiring 8 L nasal cannula         Time spent: 25 minutes  Author: Murray Hodgkins, MD 09/28/2021 9:25 PM  For on call review [email or web address].

## 2021-09-28 NOTE — Progress Notes (Signed)
Daily Progress Note   Patient Name: Kathryn Buckley       Date: 09/28/2021 DOB: 07-05-1951  Age: 71 y.o. MRN#: [id number] Attending Physician: Samuella Cota, MD Primary Care Physician: Debbrah Alar, NP Admit Date: 09/19/2021  Reason for Consultation/Follow-up: Establishing goals of care  Subjective:    Length of Stay: 8  Current Medications: Scheduled Meds:   carbamazepine  100 mg Oral BID   chlorhexidine  15 mL Mouth Rinse BID   Chlorhexidine Gluconate Cloth  6 each Topical Daily   FLUoxetine  40 mg Oral Daily   insulin aspart  0-9 Units Subcutaneous TID WC   lactulose  20 g Oral BID   levothyroxine  125 mcg Oral Q0600   lidocaine  1 patch Transdermal Q24H   mouth rinse  15 mL Mouth Rinse BID   methylPREDNISolone (SOLU-MEDROL) injection  40 mg Intravenous Q12H   nicotine  21 mg Transdermal Daily   pantoprazole  40 mg Oral Daily   QUEtiapine  100 mg Oral QHS    Continuous Infusions:  sodium chloride Stopped (09/19/21 1532)    PRN Meds: sodium chloride, benzonatate, HYDROmorphone (DILAUDID) injection, levalbuterol, LORazepam, ondansetron (ZOFRAN) IV, sodium chloride  Physical Exam          Vital Signs: BP (!) 139/105 (BP Location: Right Arm)    Pulse (!) 112    Temp 97.9 F (36.6 C) (Axillary)    Resp 20    Ht 5\' 9"  (1.753 m)    Wt 80.5 kg    LMP 09/23/1988    SpO2 96%    BMI 26.21 kg/m  SpO2: SpO2: 96 % O2 Device: O2 Device: Nasal Cannula O2 Flow Rate: O2 Flow Rate (L/min): 6 L/min  Intake/output summary:  Intake/Output Summary (Last 24 hours) at 09/28/2021 1458 Last data filed at 09/28/2021 1304 Gross per 24 hour  Intake 240 ml  Output 3600 ml  Net -3360 ml   LBM: Last BM Date: 09/28/21 Baseline Weight: Weight: 71.7 kg Most recent weight: Weight:  80.5 kg       Palliative Assessment/Data:      Patient Active Problem List   Diagnosis Date Noted   CAP (community acquired pneumonia) 09/19/2021   COPD with acute exacerbation ([place]) 09/19/2021   Acute on chronic respiratory failure with hypoxia ([place]) 09/19/2021  SOB (shortness of breath) 09/19/2021   AKI (acute kidney injury) ([place]) 09/19/2021   Positive colorectal cancer screening using Cologuard test 11/22/2020   Renal insufficiency 10/07/2020   Hyperglycemia 04/30/2017   Seizure disorder ([place]) 04/01/2016   History of third degree heart block 04/01/2016   Hyperlipidemia 12/14/2014   Bipolar 1 disorder, mixed, moderate ([place]) 08/25/2014   Chronic respiratory failure with hypoxia ([place]) 08/25/2014   COPD (chronic obstructive pulmonary disease) ([place]) 07/03/2014   Basal cell carcinoma of cheek 06/04/2013   Routine general medical examination at a health care facility 10/15/2012   Abnormal EKG 10/15/2012   Hypoxia 10/15/2012   HTN (hypertension) 09/06/2012   Hypothyroid 09/06/2012   GERD (gastroesophageal reflux disease) 09/06/2012   Tobacco abuse 09/06/2012   Depression 11/18/2011    Palliative Care Assessment & Plan   Patient Profile:  71 y.o. female  with past medical history of severe COPD (2L at home), heart block s/p pacemaker, hypertension, hyperlipidemia, hypothyroidism, anxiety, depression, bipolar disorder, steroid induced hyperglycemia admitted on 09/19/2021 with shortness of breath with acute on chronic COPD exacerbation and community acquired pneumonia. Patient decided DNR 09/21/21. Hospitalization complicated by hypotension requiring fluid bolus, confusion (refusing BiPAP), renal failure.    Assessment: Functional decline  Cognitive decline Poor PO intake  Recommendations/Plan: Monitor hospital course and overall disease trajectory, plan is for comfort care if the patient has ongoing decline, PMT to follow.    Code Status:    Code Status Orders  (From  admission, onward)           Start     Ordered   09/21/21 1755  Do not attempt resuscitation (DNR)  Continuous       Question Answer Comment  In the event of cardiac or respiratory ARREST Do not call a code blue   In the event of cardiac or respiratory ARREST Do not perform Intubation, CPR, defibrillation or ACLS   In the event of cardiac or respiratory ARREST Use medication by any route, position, wound care, and other measures to relive pain and suffering. May use oxygen, suction and manual treatment of airway obstruction as needed for comfort.      09/21/21 1754           Code Status History     Date Active Date Inactive Code Status Order ID Comments User Context   09/19/2021 1924 09/21/2021 1754 Full Code [id number]  Rhetta Mura, DO Inpatient       Prognosis:  Unable to determine  Discharge Planning: To Be Determined  Care plan was discussed with patient and sister Luellen Pucker who was at the bedside.   Thank you for allowing the Palliative Medicine Team to assist in the care of this patient.   Time In: 1400 Time Out: 1435 Total Time 35 Prolonged Time Billed  no       Greater than 50%  of this time was spent counseling and coordinating care related to the above assessment and plan.  Loistine Chance, MD  Please contact Palliative Medicine Team phone at [phone] for questions and concerns.

## 2021-09-28 NOTE — Progress Notes (Signed)
Pt has had increasingly erratic and agitated behavior through shift.  Has set off bed alarm numerous times to "use bathroom", but when told she has a catheter she argues regarding catheter.  Pt wakes up and stating "I need to put clothes on to go home".  Pt is somewhat redirectable throughout shift, but currently refusing all monitoring.  Pt seems to be alert and oriented to self and place, but also seems to shrug off her confusion as "being up for too many days".    09/28/21 0555  Provider Notification  Provider Name/Title Clarene Essex  Date Provider Notified 09/28/21  Time Provider Notified [phone]  Notification Type Page  Notification Reason Other (Comment) (Pt increasingly more confused, refusing monitoring.  Erratic behavior.)

## 2021-09-29 DIAGNOSIS — R0602 Shortness of breath: Secondary | ICD-10-CM | POA: Diagnosis not present

## 2021-09-29 DIAGNOSIS — Z515 Encounter for palliative care: Secondary | ICD-10-CM | POA: Diagnosis not present

## 2021-09-29 DIAGNOSIS — J9621 Acute and chronic respiratory failure with hypoxia: Secondary | ICD-10-CM | POA: Diagnosis not present

## 2021-09-29 DIAGNOSIS — N179 Acute kidney failure, unspecified: Secondary | ICD-10-CM | POA: Diagnosis not present

## 2021-09-29 DIAGNOSIS — J189 Pneumonia, unspecified organism: Secondary | ICD-10-CM

## 2021-09-29 LAB — BASIC METABOLIC PANEL
Anion gap: 9 (ref 5–15)
BUN: 52 mg/dL — ABNORMAL HIGH (ref 8–23)
CO2: 29 mmol/L (ref 22–32)
Calcium: 8.1 mg/dL — ABNORMAL LOW (ref 8.9–10.3)
Chloride: 106 mmol/L (ref 98–111)
Creatinine, Ser: 2.17 mg/dL — ABNORMAL HIGH (ref 0.44–1.00)
GFR, Estimated: 24 mL/min — ABNORMAL LOW (ref >60)
Glucose, Bld: 186 mg/dL — ABNORMAL HIGH (ref 70–99)
Potassium: 3.4 mmol/L — ABNORMAL LOW (ref 3.5–5.1)
Sodium: 144 mmol/L (ref 135–145)

## 2021-09-29 LAB — CBC
HCT: 40 % (ref 36.0–46.0)
Hemoglobin: 11.9 g/dL — ABNORMAL LOW (ref 12.0–15.0)
MCH: 28.7 pg (ref 26.0–34.0)
MCHC: 29.8 g/dL — ABNORMAL LOW (ref 30.0–36.0)
MCV: 96.4 fL (ref 80.0–100.0)
Platelets: 89 10*3/uL — ABNORMAL LOW (ref 150–400)
RBC: 4.15 MIL/uL (ref 3.87–5.11)
RDW: 17.7 % — ABNORMAL HIGH (ref 11.5–15.5)
WBC: 4.8 10*3/uL (ref 4.0–10.5)
nRBC: 0 % (ref 0.0–0.2)

## 2021-09-29 LAB — GLUCOSE, CAPILLARY
Glucose-Capillary: 213 mg/dL — ABNORMAL HIGH (ref 70–99)
Glucose-Capillary: 188 mg/dL — ABNORMAL HIGH (ref 70–99)

## 2021-09-29 MED ORDER — PREDNISONE 20 MG PO TABS
40.0000 mg | ORAL_TABLET | Freq: Every day | ORAL | Status: DC
  Administered 2021-09-30 – 2021-10-04 (×4): 40 mg via ORAL
  Filled 2021-09-29 (×5): qty 2

## 2021-09-29 MED ORDER — MORPHINE SULFATE (PF) 2 MG/ML IV SOLN
1.0000 mg | Freq: Once | INTRAVENOUS | Status: AC
  Administered 2021-09-29: 1 mg via INTRAVENOUS
  Filled 2021-09-29: qty 1

## 2021-09-29 NOTE — Plan of Care (Signed)
  Problem: Education: Goal: Knowledge of General Education information will improve Description: Including pain rating scale, medication(s)/side effects and non-pharmacologic comfort measures Outcome: Progressing   Problem: Health Behavior/Discharge Planning: Goal: Ability to manage health-related needs will improve Outcome: Progressing   Problem: Clinical Measurements: Goal: Ability to maintain clinical measurements within normal limits will improve Outcome: Progressing Goal: Will remain free from infection Outcome: Progressing Goal: Diagnostic test results will improve Outcome: Progressing Goal: Respiratory complications will improve Outcome: Progressing Goal: Cardiovascular complication will be avoided Outcome: Progressing   Problem: Activity: Goal: Risk for activity intolerance will decrease Outcome: Progressing   Problem: Nutrition: Goal: Adequate nutrition will be maintained Outcome: Progressing   Problem: Coping: Goal: Level of anxiety will decrease Outcome: Progressing   Problem: Elimination: Goal: Will not experience complications related to bowel motility Outcome: Progressing Goal: Will not experience complications related to urinary retention Outcome: Progressing   Problem: Pain Managment: Goal: General experience of comfort will improve Outcome: Progressing   Problem: Safety: Goal: Ability to remain free from injury will improve Outcome: Progressing   Problem: Skin Integrity: Goal: Risk for impaired skin integrity will decrease Outcome: Progressing   Problem: Activity: Goal: Ability to tolerate increased activity will improve Outcome: Progressing   Problem: Clinical Measurements: Goal: Ability to maintain a body temperature in the normal range will improve Outcome: Progressing   Problem: Respiratory: Goal: Ability to maintain adequate ventilation will improve Outcome: Progressing Goal: Ability to maintain a clear airway will improve Outcome:  Progressing   Problem: Education: Goal: Ability to demonstrate management of disease process will improve Outcome: Progressing Goal: Ability to verbalize understanding of medication therapies will improve Outcome: Progressing Goal: Individualized Educational Video(s) Outcome: Progressing   Problem: Activity: Goal: Capacity to carry out activities will improve Outcome: Progressing   Problem: Cardiac: Goal: Ability to achieve and maintain adequate cardiopulmonary perfusion will improve Outcome: Progressing   

## 2021-09-29 NOTE — Progress Notes (Signed)
Received report from nurse who had patient from 1900 on 09/28/21 to 0335 09/29/21. Agreeable with the patient's current assessment, and will continue to monitor patient for the rest of the shift.

## 2021-09-29 NOTE — Progress Notes (Addendum)
Daily Progress Note   Patient Name: Kathryn Buckley       Date: 09/29/2021 DOB: 1951/05/15  Age: 71 y.o. MRN#: [id number] Attending Physician: Samuella Cota, MD Primary Care Physician: Debbrah Alar, NP Admit Date: 09/19/2021  Reason for Consultation/Follow-up: Establishing goals of care  Subjective:  Still with higher than usual home O2 requirements, however more awake alert, sitting in chair. Tells me she misses her dog Ruby, she is a Engineer, mining. We discussed about her current condition, ongoing hospitalization and her goals/wishes, see below.    Length of Stay: 9  Current Medications: Scheduled Meds:   carbamazepine  100 mg Oral BID   chlorhexidine  15 mL Mouth Rinse BID   Chlorhexidine Gluconate Cloth  6 each Topical Daily   FLUoxetine  40 mg Oral Daily   insulin aspart  0-9 Units Subcutaneous TID WC   lactulose  20 g Oral BID   levothyroxine  125 mcg Oral Q0600   lidocaine  1 patch Transdermal Q24H   mouth rinse  15 mL Mouth Rinse BID   methylPREDNISolone (SOLU-MEDROL) injection  40 mg Intravenous Q24H   nicotine  21 mg Transdermal Daily   pantoprazole  40 mg Oral Daily   QUEtiapine  100 mg Oral QHS    Continuous Infusions:  sodium chloride Stopped (09/19/21 1532)    PRN Meds: sodium chloride, benzonatate, levalbuterol, LORazepam, ondansetron (ZOFRAN) IV, sodium chloride  Physical Exam         Awake alert Sitting in chair Diminished breath sounds S 1 S 2  Abdomen not distended No edema Oriented, answers appropriately  Vital Signs: BP (!) 125/97 (BP Location: Left Arm)    Pulse (!) 115    Temp 97.9 F (36.6 C) (Axillary)    Resp 20    Ht 5\' 9"  (1.753 m)    Wt 81.1 kg    LMP 09/23/1988    SpO2 94%    BMI 26.40 kg/m  SpO2: SpO2: 94 % O2 Device: O2  Device: Nasal Cannula O2 Flow Rate: O2 Flow Rate (L/min): 6 L/min  Intake/output summary:  Intake/Output Summary (Last 24 hours) at 09/29/2021 1129 Last data filed at 09/29/2021 0800 Gross per 24 hour  Intake 540 ml  Output 1800 ml  Net -1260 ml    LBM: Last BM Date: 09/28/21 Baseline Weight:  Weight: 71.7 kg Most recent weight: Weight: 81.1 kg       Palliative Assessment/Data:      Patient Active Problem List   Diagnosis Date Noted   CAP (community acquired pneumonia) 09/19/2021   COPD with acute exacerbation ([place]) 09/19/2021   Acute on chronic respiratory failure with hypoxia ([place]) 09/19/2021   SOB (shortness of breath) 09/19/2021   AKI (acute kidney injury) ([place]) 09/19/2021   Positive colorectal cancer screening using Cologuard test 11/22/2020   Renal insufficiency 10/07/2020   Hyperglycemia 04/30/2017   Seizure disorder ([place]) 04/01/2016   History of third degree heart block 04/01/2016   Hyperlipidemia 12/14/2014   Bipolar 1 disorder, mixed, moderate ([place]) 08/25/2014   Chronic respiratory failure with hypoxia ([place]) 08/25/2014   COPD (chronic obstructive pulmonary disease) ([place]) 07/03/2014   Basal cell carcinoma of cheek 06/04/2013   Routine general medical examination at a health care facility 10/15/2012   Abnormal EKG 10/15/2012   Hypoxia 10/15/2012   HTN (hypertension) 09/06/2012   Hypothyroid 09/06/2012   GERD (gastroesophageal reflux disease) 09/06/2012   Tobacco abuse 09/06/2012   Depression 11/18/2011    Palliative Care Assessment & Plan   Patient Profile:  71 y.o. female  with past medical history of severe COPD (2L at home), heart block s/p pacemaker, hypertension, hyperlipidemia, hypothyroidism, anxiety, depression, bipolar disorder, steroid induced hyperglycemia admitted on 09/19/2021 with shortness of breath with acute on chronic COPD exacerbation and community acquired pneumonia. Patient decided DNR 09/21/21. Hospitalization complicated by hypotension  requiring fluid bolus, confusion (refusing BiPAP), renal failure.    Assessment: Functional decline  Cognitive decline Poor PO intake  Recommendations/Plan: Monitor hospital course and overall disease trajectory, plan is for comfort care if the patient has ongoing decline, PMT to follow.  Recommend home with palliative services on discharge. Required PRN IV Morphine overnight.    Code Status:    Code Status Orders  (From admission, onward)           Start     Ordered   09/21/21 1755  Do not attempt resuscitation (DNR)  Continuous       Question Answer Comment  In the event of cardiac or respiratory ARREST Do not call a code blue   In the event of cardiac or respiratory ARREST Do not perform Intubation, CPR, defibrillation or ACLS   In the event of cardiac or respiratory ARREST Use medication by any route, position, wound care, and other measures to relive pain and suffering. May use oxygen, suction and manual treatment of airway obstruction as needed for comfort.      09/21/21 1754           Code Status History     Date Active Date Inactive Code Status Order ID Comments User Context   09/19/2021 1924 09/21/2021 1754 Full Code [id number]  Rhetta Mura, DO Inpatient       Prognosis:  Unable to determine  Discharge Planning: Home with palliative.   Care plan was discussed with patient    Thank you for allowing the Palliative Medicine Team to assist in the care of this patient.   Time In: 9 Time Out: 9.25 Total Time 25 Prolonged Time Billed  no       Greater than 50%  of this time was spent counseling and coordinating care related to the above assessment and plan.  Loistine Chance, MD  Please contact Palliative Medicine Team phone at [phone] for questions and concerns.

## 2021-09-29 NOTE — Progress Notes (Signed)
Patient ID: Kathryn Buckley, female   DOB: 1951-06-07, 71 y.o.   MRN: [id number] Patient requesting something for pain , and dyspnea, now her creatinine is improving will try one time dose of 1 mg IV morphine to see if it help with air hunger. Phillips Climes MD

## 2021-09-29 NOTE — Progress Notes (Signed)
°  Progress Note   Patient: Kathryn Buckley [id number]:[id number] DOB: 1951-03-29 DOA: 09/19/2021     9 DOS: the patient was seen and examined on 09/29/2021   Brief hospital course: 71 year old woman COPD admitted for acute on chronic hypoxic respiratory failure secondary to COPD exacerbation, pneumonia and acute systolic CHF.  Complicated hospitalization, did not do well with Lasix.  Palliative care following.  No escalation of care.  Still requiring 8 L nasal cannula.  Lives alone.  Confusion nearly resolved today.  Adamant about going home, clearly at this point understands her condition, she does have some local assistance.  Continue current management, hopefully can wean oxygen a bit more, plan at this time is discharged home in stable.  Acute on chronic respiratory failure with hypoxia:  -- Currently on 8 L, multifactorial COPD, pulmonary artery hypertension, CHF.  Patient unable to tolerate BiPAP. -- Somewhat better today, wean oxygen as tolerated.   Hypokalemia. -- Still low, replete   RLL lobar pneumonia --Completed Zithromax, Rocephin.  Appears resolved.   Acute systolic congestive heart failure.   --Treated with Lasix but developed hypotension and AKI.  --Follow volume status.  Fluid balance is negative.   AKI superimposed on CKD stage IIIb --Secondary to IV diuretics.  Creatinine trended up during IV diuretics. -- Creatinine continues to slowly improve, appears to be about at baseline.  Follow intermittently.   Acute metabolic encephalopathy:  --Multifactorial including respiratory failure, hyperammonemia, consider medications Ativan and Seroquel.  Doses were decreased.  Continue lactulose. --Awake and alert and minimal confusion today.   Anxiety and depression --Dose of Ativan and Seroquel has been decreased.  Stable   Hypothyroidism --Continue Synthroid    Tobacco use disorder:  --Was smoking 2 packs a day.  On nicotine patch.     Elevated liver enzymes  --Likely  congestive hepatomegaly/ischemic insult in the setting of hypotension.    Acute hepatitis panel negative.  Hold statin -- Was trending down last check   Thrombocytopenia --stable, etiology unclear, follow up as outpatient   History of third-degree AVB --Status post permanent pacemaker.     Goal of care: DNR/DNI.   --Patient with multiple medical comorbidities and organ failure. Plan is no escalation of care no BiPAP or higher level of care.    Assessment and Plan No notes have been filed under this hospital service. Service: Hospitalist    Subjective:  Feels better, breathing better.  Objective Vital signs were reviewed.  Mild tachycardia.  Normotensive.  SPO2 99% on 6 L Physical Exam Vitals reviewed.  Constitutional:      General: She is not in acute distress.    Appearance: She is ill-appearing. She is not toxic-appearing.  Cardiovascular:     Rate and Rhythm: Normal rate and regular rhythm.     Heart sounds: No murmur heard. Pulmonary:     Effort: No respiratory distress.     Breath sounds: No wheezing, rhonchi or rales.     Comments: Mild increased respiratory effort Psychiatric:        Mood and Affect: Mood normal.        Behavior: Behavior normal.     Data Reviewed:  K+ 3.4 , creatinine downt to 2.17, about baseline Plts 89, stable  Family Communication:   Disposition: Status is: Inpatient  Remains inpatient appropriate because: hypoxia         Time spent: 25 minutes  Author: Murray Hodgkins, MD 09/29/2021 5:25 PM  For on call review [email or web address].

## 2021-09-30 DIAGNOSIS — J449 Chronic obstructive pulmonary disease, unspecified: Secondary | ICD-10-CM

## 2021-09-30 DIAGNOSIS — J9621 Acute and chronic respiratory failure with hypoxia: Secondary | ICD-10-CM | POA: Diagnosis not present

## 2021-09-30 DIAGNOSIS — N179 Acute kidney failure, unspecified: Secondary | ICD-10-CM | POA: Diagnosis not present

## 2021-09-30 LAB — BASIC METABOLIC PANEL
Anion gap: 9 (ref 5–15)
BUN: 56 mg/dL — ABNORMAL HIGH (ref 8–23)
CO2: 30 mmol/L (ref 22–32)
Calcium: 8 mg/dL — ABNORMAL LOW (ref 8.9–10.3)
Chloride: 107 mmol/L (ref 98–111)
Creatinine, Ser: 2.27 mg/dL — ABNORMAL HIGH (ref 0.44–1.00)
GFR, Estimated: 23 mL/min — ABNORMAL LOW (ref >60)
Glucose, Bld: 147 mg/dL — ABNORMAL HIGH (ref 70–99)
Potassium: 3.5 mmol/L (ref 3.5–5.1)
Sodium: 146 mmol/L — ABNORMAL HIGH (ref 135–145)

## 2021-09-30 LAB — GLUCOSE, CAPILLARY
Glucose-Capillary: 150 mg/dL — ABNORMAL HIGH (ref 70–99)
Glucose-Capillary: 174 mg/dL — ABNORMAL HIGH (ref 70–99)

## 2021-09-30 MED ORDER — ALUM & MAG HYDROXIDE-SIMETH 200-200-20 MG/5ML PO SUSP
30.0000 mL | Freq: Four times a day (QID) | ORAL | Status: DC | PRN
  Administered 2021-09-30: 30 mL via ORAL
  Filled 2021-09-30 (×2): qty 30

## 2021-09-30 NOTE — Progress Notes (Signed)
Patient said they wanted something for indigestion. MD notified.

## 2021-09-30 NOTE — Progress Notes (Signed)
Pts diastolic BP is elevated above 100. Recent BP was 136/101, recheck was 139/108. Pt asymptomatic. MD notified.

## 2021-09-30 NOTE — Progress Notes (Addendum)
Progress Note   Patient: Kathryn Buckley [id number]:[id number] DOB: 08-Jul-1951 DOA: 09/19/2021     10 DOS: the patient was seen and examined on 09/30/2021   Brief hospital course: 71 year old woman COPD admitted for acute on chronic hypoxic respiratory failure secondary to COPD exacerbation, pneumonia and acute systolic CHF.  Complicated hospitalization, did not do well with Lasix.  Palliative care following.  No escalation of care.  Still requiring 8 L nasal cannula.  Lives alone.  Confusion nearly resolved today.  Adamant about going home, clearly at this point understands her condition, she does have some local assistance.  Continue current management, hopefully can wean oxygen a bit more, plan at this time is discharge home when stable.  Acute on chronic respiratory failure with hypoxia:  -- Down to 6 L.  Appears to be stabilizing overall.  Multifactorial COPD, pulmonary artery hypertension, CHF.  Patient unable to tolerate BiPAP. -- Likely over the next 48 hours.   Hypokalemia. -- Resolved   RLL lobar pneumonia --Completed Zithromax, Rocephin.  Resolved   Acute systolic congestive heart failure.   --Treated with Lasix but developed hypotension and AKI.  --Follow volume status.  Fluid balance is negative.  No evidence of volume overload.   AKI superimposed on CKD stage IIIb --Secondary to IV diuretics.  Creatinine trended up during IV diuretics. -- Creatinine continues to slowly improve, appears to be about at baseline.  Follow intermittently.   Acute metabolic encephalopathy:  --Multifactorial including respiratory failure, hyperammonemia, consider medications Ativan and Seroquel.  Doses were decreased.  Continue lactulose. --Awake and alert and mentation appears stable.   Anxiety and depression --Dose of Ativan and Seroquel has been decreased.  Stable   Hypothyroidism --Continue Synthroid    Tobacco use disorder:  --Was smoking 2 packs a day.  On nicotine patch.     Elevated  liver enzymes  --Likely congestive hepatomegaly/ischemic insult in the setting of hypotension.    Acute hepatitis panel negative.  Hold statin -- Was trending down last check   Thrombocytopenia --stable, etiology unclear, follow up as outpatient   History of third-degree AVB --Status post permanent pacemaker.     Goal of care: DNR/DNI.   --Patient with multiple medical comorbidities and organ failure. Plan is no escalation of care no BiPAP or higher level of care.    Assessment and Plan No notes have been filed under this hospital service. Service: Hospitalist    Subjective:  Feels ok Breathing ok Slept poorly Hopes to go home tomorrow  Objective Vital signs were reviewed and unremarkable. Physical Exam Constitutional:      General: She is not in acute distress.    Appearance: She is ill-appearing (chronically ill-appearing). She is not toxic-appearing.  Cardiovascular:     Rate and Rhythm: Normal rate and regular rhythm.     Heart sounds: No murmur heard. Pulmonary:     Effort: No respiratory distress.     Breath sounds: No wheezing or rales.     Comments: Decreased breath sounds, effort moderately increased Neurological:     Mental Status: She is alert.  Psychiatric:        Mood and Affect: Mood normal.        Behavior: Behavior normal.     Data Reviewed:  Creatinine 2.27, BUN 56, mild increase compared to yesterday  Family Communication: updated sister by telephone Luellen Pucker  Disposition: Status is: Inpatient  Remains inpatient appropriate because: hypoxic resp failure         Time spent: 61  minutes  Author: Murray Hodgkins, MD 09/30/2021 3:25 PM  For on call review [email or web address].

## 2021-10-01 DIAGNOSIS — J441 Chronic obstructive pulmonary disease with (acute) exacerbation: Secondary | ICD-10-CM | POA: Diagnosis not present

## 2021-10-01 DIAGNOSIS — J9621 Acute and chronic respiratory failure with hypoxia: Secondary | ICD-10-CM | POA: Diagnosis not present

## 2021-10-01 DIAGNOSIS — N179 Acute kidney failure, unspecified: Secondary | ICD-10-CM | POA: Diagnosis not present

## 2021-10-01 LAB — BASIC METABOLIC PANEL
Anion gap: 4 — ABNORMAL LOW (ref 5–15)
BUN: 53 mg/dL — ABNORMAL HIGH (ref 8–23)
CO2: 29 mmol/L (ref 22–32)
Calcium: 7.8 mg/dL — ABNORMAL LOW (ref 8.9–10.3)
Chloride: 110 mmol/L (ref 98–111)
Creatinine, Ser: 2.26 mg/dL — ABNORMAL HIGH (ref 0.44–1.00)
GFR, Estimated: 23 mL/min — ABNORMAL LOW (ref >60)
Glucose, Bld: 142 mg/dL — ABNORMAL HIGH (ref 70–99)
Potassium: 3.2 mmol/L — ABNORMAL LOW (ref 3.5–5.1)
Sodium: 143 mmol/L (ref 135–145)

## 2021-10-01 LAB — GLUCOSE, CAPILLARY
Glucose-Capillary: 181 mg/dL — ABNORMAL HIGH (ref 70–99)
Glucose-Capillary: 182 mg/dL — ABNORMAL HIGH (ref 70–99)
Glucose-Capillary: 261 mg/dL — ABNORMAL HIGH (ref 70–99)
Glucose-Capillary: 145 mg/dL — ABNORMAL HIGH (ref 70–99)
Glucose-Capillary: 159 mg/dL — ABNORMAL HIGH (ref 70–99)
Glucose-Capillary: 253 mg/dL — ABNORMAL HIGH (ref 70–99)
Glucose-Capillary: 150 mg/dL — ABNORMAL HIGH (ref 70–99)
Glucose-Capillary: 221 mg/dL — ABNORMAL HIGH (ref 70–99)

## 2021-10-01 MED ORDER — GUAIFENESIN ER 600 MG PO TB12
600.0000 mg | ORAL_TABLET | Freq: Two times a day (BID) | ORAL | Status: DC
  Administered 2021-10-01 – 2021-10-04 (×7): 600 mg via ORAL
  Filled 2021-10-01 (×7): qty 1

## 2021-10-01 MED ORDER — POTASSIUM CHLORIDE CRYS ER 20 MEQ PO TBCR
40.0000 meq | EXTENDED_RELEASE_TABLET | Freq: Once | ORAL | Status: AC
  Administered 2021-10-01: 40 meq via ORAL
  Filled 2021-10-01: qty 2

## 2021-10-01 MED ORDER — MELATONIN 5 MG PO TABS
5.0000 mg | ORAL_TABLET | Freq: Once | ORAL | Status: AC
  Administered 2021-10-01: 5 mg via ORAL
  Filled 2021-10-01: qty 1

## 2021-10-01 NOTE — Progress Notes (Addendum)
°  Progress Note   Patient: Kathryn Buckley [id number]:[id number] DOB: Nov 28, 1950 DOA: 09/19/2021     11 DOS: the patient was seen and examined on 10/01/2021   Brief hospital course: 71 year old woman COPD admitted for acute on chronic hypoxic respiratory failure secondary to COPD exacerbation, pneumonia and acute systolic CHF.  Complicated hospitalization, did not do well with Lasix.  Palliative care following.  No escalation of care.  Was adamant about going home, but no agreeable to SNF. Plan discharge when has a bed.  Acute on chronic respiratory failure with hypoxia:  -- Down to 4-6 L.  Appears stable.  Multifactorial COPD, pulmonary artery hypertension, CHF.  Patient unable to tolerate BiPAP. -- SNF when bed available   Hypokalemia. -- Resolved   RLL lobar pneumonia --Completed Zithromax, Rocephin.  Resolved   Acute systolic congestive heart failure.   --Treated with Lasix but developed hypotension and AKI.  --Follow volume status.  Fluid balance is negative.  No evidence of volume overload.   AKI superimposed on CKD stage IIIb --Secondary to IV diuretics.  Creatinine trended up during IV diuretics. -- Creatinine stable. Follow-up as an outpatient.   Acute metabolic encephalopathy:  --Multifactorial including respiratory failure, hyperammonemia, consider medications Ativan and Seroquel.  Doses were decreased.  Continue lactulose. --Awake and alert and mentation appears stable.   Anxiety and depression --Dose of Ativan and Seroquel has been decreased.  Stable   Hypothyroidism --Continue Synthroid    Tobacco use disorder:  --Was smoking 2 packs a day.  On nicotine patch.     Elevated liver enzymes  --Likely congestive hepatomegaly/ischemic insult in the setting of hypotension.    Acute hepatitis panel negative.  Hold statin -- follow-up as an outpatient   Thrombocytopenia --stable, etiology unclear, follow up as outpatient   History of third-degree AVB --Status post  permanent pacemaker.     Goal of care: DNR/DNI.   --Patient with multiple medical comorbidities and organ failure. Plan is no escalation of care no BiPAP or higher level of care.    Assessment and Plan No notes have been filed under this hospital service. Service: Hospitalist    Subjective:  Feels fine Breathing ok Per nursing, ambulated well, oxygen requirement around 4 L  Objective Vital signs were reviewed and unremarkable. Physical Exam Vitals reviewed.  Constitutional:      General: She is not in acute distress.    Appearance: She is not ill-appearing or toxic-appearing.  Cardiovascular:     Rate and Rhythm: Normal rate and regular rhythm.     Heart sounds: No murmur heard. Pulmonary:     Effort: Pulmonary effort is normal. No respiratory distress.     Breath sounds: No wheezing, rhonchi or rales.  Musculoskeletal:     Right lower leg: No edema.     Left lower leg: No edema.  Neurological:     Mental Status: She is alert.  Psychiatric:        Mood and Affect: Mood normal.        Behavior: Behavior normal.     Data Reviewed:  K+ 3.2 Creatinine stable 2.26  Family Communication:   Disposition: Status is: Inpatient  Remains inpatient appropriate because: hypoxic respiratory failure         Time spent: 35 minutes  Author: Murray Hodgkins, MD 10/01/2021 4:57 PM  For on call review [email or web address].

## 2021-10-01 NOTE — Progress Notes (Addendum)
Physical Therapy Treatment Patient Details Name: Kathryn Buckley MRN: [id number] DOB: [birth year] Today's Date: 10/01/2021   History of Present Illness 71 year old female presenting with progressive shortness of breath, productive cough, fever, rigor and myalgia for about 2 to 3 weeks and admitted 09/19/21 for acute on chronic hypoxic respiratory failure in the setting of COPD exacerbation and community-acquired pneumonia, and AKI on CKD-3B.  During this admission, Pt with metabolic encephalopathy acidosis, acute kidney injury, hypertensio,n and hyperammonemia.  She was not able to tolerate her BiPAP and was constantly fighting against it.  Palliative care was then consulted for goals of care.  Pt currently on 6L HFNC and cognition improved 09/25/21 with new PT order.  PMH of COPD/chronic hypoxic RF on 2 L, CKD-3B, CHB/PPM, HTN, hypothyroidism, hyperlipidemia, anxiety, depression and tobacco use disorder (2 PPD)    PT Comments    Pt agreeable to working with PT. She c/o fatigue and not having any rest for last few days. RN reported she had just decreased pt's O2 to 4L. During session, pulse ox reading fluctuated quite a bit-possibly 2* poor sensor-so unsure of accuracy of ambulatory. O2 reading during ambulation:O2 88% on 4L amb, 96% on 4L at rest/end of session. If pt declines SNF, will need HHPT and 24/7 supervision/assist likely. Will continue to follow and progress activity as tolerated.     Recommendations for follow up therapy are one component of a multi-disciplinary discharge planning process, led by the attending physician.  Recommendations may be updated based on patient status, additional functional criteria and insurance authorization.  Follow Up Recommendations  Skilled nursing-short term rehab (<3 hours/day)      Assistance Recommended at Discharge Frequent or constant Supervision/Assistance  Patient can return home with the following A little help with walking and/or transfers;A  little help with bathing/dressing/bathroom;Help with stairs or ramp for entrance   Equipment Recommendations  None recommended by PT    Recommendations for Other Services       Precautions / Restrictions Precautions Precautions: Fall Precaution Comments: monitor sats-O2 dep at baseline 2L-currently on 4L Restrictions Weight Bearing Restrictions: No     Mobility  Bed Mobility Overal bed mobility: Modified Independent Bed Mobility: Supine to Sit;Sit to Supine     Supine to sit: Modified independent (Device/Increase time);HOB elevated Sit to supine: Modified independent (Device/Increase time);HOB elevated   General bed mobility comments: used bedrail    Transfers Overall transfer level: Needs assistance Equipment used: Rolling walker (2 wheels) Transfers: Sit to/from Stand Sit to Stand: Min assist;From elevated surface           General transfer comment: Assist to power up and steady. Used rollator on today. Cues for safety hand placement, proper operation of rollator    Ambulation/Gait Ambulation/Gait assistance: Min assist Gait Distance (Feet): 75 Feet Assistive device: Rollator (4 wheels) Gait Pattern/deviations: Step-through pattern;Decreased stride length       General Gait Details: Assist to stabilize throughout distance. Less steady with rollator compared to with standard RW (per notes). HR briefly up to 160 bpm however was ~116 bpm mostly. O2 88% on 4L. Pulse ox sensor monitoring fluctuating so unsure if reading was accurate   Stairs             Wheelchair Mobility    Modified Rankin (Stroke Patients Only)       Balance Overall balance assessment: Mild deficits observed, not formally tested  Cognition Arousal/Alertness: Awake/alert Behavior During Therapy: Flat affect Overall Cognitive Status: Within Functional Limits for tasks assessed                                           Exercises      General Comments        Pertinent Vitals/Pain Pain Assessment: Faces Faces Pain Scale: No hurt    Home Living                          Prior Function            PT Goals (current goals can now be found in the care plan section) Progress towards PT goals: Progressing toward goals    Frequency    Min 3X/week      PT Plan Current plan remains appropriate    Co-evaluation              AM-PAC PT "6 Clicks" Mobility   Outcome Measure  Help needed turning from your back to your side while in a flat bed without using bedrails?: None Help needed moving from lying on your back to sitting on the side of a flat bed without using bedrails?: None Help needed moving to and from a bed to a chair (including a wheelchair)?: A Little Help needed standing up from a chair using your arms (e.g., wheelchair or bedside chair)?: A Little Help needed to walk in hospital room?: A Little Help needed climbing 3-5 steps with a railing? : A Little 6 Click Score: 20    End of Session Equipment Utilized During Treatment: Gait belt;Oxygen Activity Tolerance: Patient limited by fatigue Patient left: in bed;with call bell/phone within reach;with bed alarm set   PT Visit Diagnosis: Other abnormalities of gait and mobility (R26.89);Muscle weakness (generalized) (M62.81)     Time: 3419-3790 PT Time Calculation (min) (ACUTE ONLY): 29 min  Charges:  $Gait Training: 23-37 mins                         Doreatha Massed, PT Acute Rehabilitation  Office: [phone] Pager: [phone]

## 2021-10-01 NOTE — NC FL2 (Signed)
[place] LEVEL OF CARE SCREENING TOOL     IDENTIFICATION  Patient Name: Kathryn Buckley Birthdate: 1950-11-15 Sex: female Admission Date (Current Location): 09/19/2021  [facility] and [place] Number:  Herbalist and Address:  [facility],  [place], [place]      Provider Number: [id number]  Attending Physician Name and Address:  Samuella Cota, MD  Relative Name and Phone Number:  Lenis Dickinson sister [phone]    Current Level of Care: Hospital Recommended Level of Care: [place] Prior Approval Number:    Date Approved/Denied:   PASRR Number: [id number]  Discharge Plan: SNF    Current Diagnoses: Patient Active Problem List   Diagnosis Date Noted   CAP (community acquired pneumonia) 09/19/2021   COPD with acute exacerbation ([place]) 09/19/2021   Acute on chronic respiratory failure with hypoxia ([place]) 09/19/2021   SOB (shortness of breath) 09/19/2021   AKI (acute kidney injury) ([place]) 09/19/2021   Positive colorectal cancer screening using Cologuard test 11/22/2020   Renal insufficiency 10/07/2020   Hyperglycemia 04/30/2017   Seizure disorder ([place]) 04/01/2016   History of third degree heart block 04/01/2016   Hyperlipidemia 12/14/2014   Bipolar 1 disorder, mixed, moderate ([place]) 08/25/2014   Chronic respiratory failure with hypoxia ([place]) 08/25/2014   COPD (chronic obstructive pulmonary disease) ([place]) 07/03/2014   Basal cell carcinoma of cheek 06/04/2013   Routine general medical examination at a health care facility 10/15/2012   Abnormal EKG 10/15/2012   Hypoxia 10/15/2012   HTN (hypertension) 09/06/2012   Hypothyroid 09/06/2012   GERD (gastroesophageal reflux disease) 09/06/2012   Tobacco abuse 09/06/2012   Depression 11/18/2011    Orientation RESPIRATION BLADDER Height & Weight     Self, Time, Situation, Place  O2 Continent Weight: 79.7 kg Height:  5\' 9"  (175.3 cm)  BEHAVIORAL  SYMPTOMS/MOOD NEUROLOGICAL BOWEL NUTRITION STATUS      Continent Diet (Renal diet)  AMBULATORY STATUS COMMUNICATION OF NEEDS Skin   Limited Assist Verbally Normal                       Personal Care Assistance Level of Assistance  Bathing, Feeding, Dressing Bathing Assistance: Limited assistance Feeding assistance: Limited assistance Dressing Assistance: Limited assistance     Functional Limitations Info  Sight, Hearing, Speech Sight Info: Impaired (eyeglasses) Hearing Info: Adequate Speech Info: Impaired (dentures-top)    SPECIAL CARE FACTORS FREQUENCY  PT (By licensed PT), OT (By licensed OT)     PT Frequency:  (5x week) OT Frequency:  (5x week)            Contractures Contractures Info: Not present    Additional Factors Info  Code Status, Allergies, Psychotropic Code Status Info:  (DNR) Allergies Info:  (Lamictal;codeine) Psychotropic Info:  (ativan)         Current Medications (10/01/2021):  This is the current hospital active medication list Current Facility-Administered Medications  Medication Dose Route Frequency Provider Last Rate Last Admin   0.9 %  sodium chloride infusion   Intravenous PRN Malvin Johns, MD   Stopped at 09/19/21 1532   alum & mag hydroxide-simeth (MAALOX/MYLANTA) 200-200-20 MG/5ML suspension 30 mL  30 mL Oral Q6H PRN Lovey Newcomer T, NP   30 mL at 09/30/21 0021   benzonatate (TESSALON) capsule 200 mg  200 mg Oral TID PRN Howerter, Justin B, DO   200 mg at 09/28/21 2203   carbamazepine (TEGRETOL XR) 12 hr tablet  100 mg  100 mg Oral BID Howerter, Justin B, DO   100 mg at 10/01/21 0955   chlorhexidine (PERIDEX) 0.12 % solution 15 mL  15 mL Mouth Rinse BID Wendee Beavers T, MD   15 mL at 10/01/21 0957   Chlorhexidine Gluconate Cloth 2 % PADS 6 each  6 each Topical Daily Wendee Beavers T, MD   6 each at 09/29/21 0921   FLUoxetine (PROZAC) capsule 40 mg  40 mg Oral Daily Howerter, Justin B, DO   40 mg at 10/01/21 0954   insulin aspart (novoLOG)  injection 0-9 Units  0-9 Units Subcutaneous TID WC Howerter, Justin B, DO   2 Units at 10/01/21 1000   lactulose (CHRONULAC) 10 GM/15ML solution 20 g  20 g Oral BID Vinie Sill C, NP   20 g at 10/01/21 0955   levalbuterol (XOPENEX) nebulizer solution 1.25 mg  1.25 mg Nebulization Q6H PRN Pokhrel, Laxman, MD       levothyroxine (SYNTHROID) tablet 125 mcg  125 mcg Oral Q0600 Howerter, Justin B, DO   125 mcg at 10/01/21 0529   lidocaine (LIDODERM) 5 % 1 patch  1 patch Transdermal Q24H Wendee Beavers T, MD   1 patch at 09/30/21 1736   LORazepam (ATIVAN) tablet 0.5-1 mg  0.5-1 mg Oral Z3G PRN Vinie Sill C, NP   0.5 mg at 09/30/21 0022   MEDLINE mouth rinse  15 mL Mouth Rinse BID Gonfa, Taye T, MD   15 mL at 09/30/21 1044   nicotine (NICODERM CQ - dosed in mg/24 hours) patch 21 mg  21 mg Transdermal Daily Gonfa, Taye T, MD   21 mg at 10/01/21 1002   ondansetron (ZOFRAN) injection 4 mg  4 mg Intravenous Q6H PRN Wendee Beavers T, MD   4 mg at 10/01/21 0419   pantoprazole (PROTONIX) EC tablet 40 mg  40 mg Oral Daily Howerter, Justin B, DO   40 mg at 10/01/21 0955   predniSONE (DELTASONE) tablet 40 mg  40 mg Oral Q breakfast Samuella Cota, MD   40 mg at 10/01/21 0954   QUEtiapine (SEROQUEL) tablet 100 mg  100 mg Oral QHS Wendee Beavers T, MD   100 mg at 09/30/21 2105   sodium chloride (OCEAN) 0.65 % nasal spray 1 spray  1 spray Each Nare PRN Mercy Riding, MD   1 spray at 09/20/21 2113     Discharge Medications: Please see discharge summary for a list of discharge medications.  Relevant Imaging Results:  Relevant Lab Results:   Additional Information  (SS# [id number])  Madicyn Mesina, Juliann Pulse, RN

## 2021-10-01 NOTE — TOC Progression Note (Signed)
Transition of Care [facility]) - Progression Note    Patient Details  Name: Kathryn Buckley MRN: [id number] Date of Birth: [birth year]  Transition of Care [facility]) CM/SW Contact  Mylin Gignac, Juliann Pulse, RN Phone Number: 10/01/2021, 1:45 PM  Clinical Narrative: Damaris Schooner to patient about d/c plan SNF-in agreement-faxed out-await bed offers prior insurance auth.      Expected Discharge Plan: [place] Barriers to Discharge: Continued Medical Work up  Expected Discharge Plan and Services Expected Discharge Plan: [place]   Discharge Planning Services: CM Consult Post Acute Care Choice: [place] arrangements for the past 2 months: Single Family Home                           HH Arranged: PT [place]: [place] Date [place]: 09/20/21 Time [place]: [place] Representative spoke with at [place]: [place] ([place]) Interventions    Readmission Risk Interventions Readmission Risk Prevention Plan 09/26/2021  Transportation Screening Complete  PCP or Specialist Appt within 3-5 Days Complete  HRI or [place] Complete  Social Work Consult for [place] Planning/Counseling Complete  Palliative Care Screening Not Applicable  Medication Review Press photographer) Complete  Some recent data might be hidden

## 2021-10-02 DIAGNOSIS — J9621 Acute and chronic respiratory failure with hypoxia: Secondary | ICD-10-CM | POA: Diagnosis not present

## 2021-10-02 DIAGNOSIS — J449 Chronic obstructive pulmonary disease, unspecified: Secondary | ICD-10-CM | POA: Diagnosis not present

## 2021-10-02 LAB — SARS CORONAVIRUS 2 (TAT 6-24 HRS): SARS Coronavirus 2: NEGATIVE

## 2021-10-02 LAB — GLUCOSE, CAPILLARY
Glucose-Capillary: 131 mg/dL — ABNORMAL HIGH (ref 70–99)
Glucose-Capillary: 135 mg/dL — ABNORMAL HIGH (ref 70–99)
Glucose-Capillary: 159 mg/dL — ABNORMAL HIGH (ref 70–99)
Glucose-Capillary: 173 mg/dL — ABNORMAL HIGH (ref 70–99)
Glucose-Capillary: 185 mg/dL — ABNORMAL HIGH (ref 70–99)
Glucose-Capillary: 213 mg/dL — ABNORMAL HIGH (ref 70–99)

## 2021-10-02 NOTE — Care Management Important Message (Signed)
Important Message  Patient Details IM Letter given to the Patient. Name: Kathryn Buckley MRN: [id number] Date of Birth: 07-Dec-1950   Medicare Important Message Given:  Yes     Kerin Salen 10/02/2021, 11:10 AM

## 2021-10-02 NOTE — TOC Progression Note (Addendum)
Transition of Care [facility]) - Progression Note    Patient Details  Name: STEVEN VEAZIE MRN: [id number] Date of Birth: [birth year]  Transition of Care [facility]) CM/SW Contact  Lianni Kanaan, Juliann Pulse, RN Phone Number: 10/02/2021, 10:33 AM  Clinical Narrative: Await choice once offers provided & patient agrees.   -1:52p-Son Brookds chose [facility]-will start auth. 2:32p-NOT NAVI-[facility] rep Tressa Busman will start auth;while checking insurance-notified admit to confirm current insurance-end date 09/22/21;patient not able to confirm;Brooks son not able to confirm;await response from supv. -3p-noted per Luellen Pucker sister-patient has [facility] medicare advantage UTML-YY#[id number].group#[id number],eff 09/23/21;tel#1 [phone]-auth initiated-await auth.  1. 1.3 mi [facility] [place], [place] [address] [phone] Overall rating Above average 2. 1.6 mi [place] at [place], [place] [address] [phone] Overall rating Much below average 3. 2.1 mi [place], [place] [address] [phone] Overall rating Much below average 4. 2.5 mi [facility] [place], [place] [address] [phone] Overall rating Below average 5. 2.8 mi [facility] at the [place], [place] [address] [phone] Overall rating Average 6. 2.8 mi [place] [address] [place], [place] [address] [phone] Overall rating Much below average 7. 3 mi [facility] [place], [place] [address] [phone] Overall rating Above average 8. 3.6 [place] [address] [place], [place] [address] [phone] Overall rating Average 9. 3.6 mi [facility] [address], [place] [address] [phone] Overall rating Much below average 10. 3.9 mi [facility] [place], [place] [address] [phone] Overall rating Much below average 11. 4.4 mi [facility] at [place], [place] [address] [phone] Overall rating Much above average 12. 4.6 mi [facility] [address] [place], [place] [address] [phone] Overall rating Much above average 13. 5.5 mi [facility] [address] [place], [place] [address] [phone] Overall rating Above average 14. 8.2 [facility] [place], [place] [address] [phone] Overall rating Much above average 15. 9 mi [facility] [address] [place], [place] [address] [phone] Overall rating Below average 16. 9.1 [place] and [place], [place] [address] [phone] Overall rating Much below average 17. 9.2 mi [facility] [address] [place], [place] [address] [phone] Overall rating Much above average 18. 10.8 mi [place] at [facility] [address] [place], [place] [address] [phone] Overall rating Much above average 19. 12.6 mi [facility] [address] [place], [place] [address] [phone] Overall rating Much below average 20. 12.8 [facility] [place], [place] [address] [phone] Overall rating Much below average 21. 14.2 mi [facility] [address] [place], [place] [address] [phone] Overall rating Below average 22. 14.4 mi [facility] at [place],  [address] [phone] Overall rating Above average 23. 14.8 mi [address] and  [facility] [place],  [address] [phone] Overall rating Much above average 24. 14.9 mi [facility] [place] [address]  [place], [place] [address] [phone] Overall rating Much below average 25. 16.5 mi [facility] [address] [place], [place] [address] [phone] Overall rating Average 26. 16.7 mi [facility] [place], [place] [address] [phone] Overall rating Above average 27. 17.9 mi [place], [place] [address] [phone] Overall rating Average 28. 24.5 [facility] [address] [place], [place] [address] [phone] Overall rating Much below average 29. 19.7 mi [facility] [address] [place], [place] [address] [phone] Overall rating Much below average 30. 20 [address] at [employer] at [facility], [place] [address] [phone] Overall rating Much above average 31. 21.1 mi [place], [place] [address] [phone] Overall rating Much below average 32. 21.6 [address] [place], [place] [address] [phone] Overall rating Below average 33. 97.0 [facility] [address] [place], [place] [address] [phone] Overall rating Below average 34. 21.8 [place], [place] [address] [phone] Overall rating Above average 35. [address] [place], [place] [address] [phone] Overall rating Much above average 36. 22.6 mi [facility] [address] [place], [place] [address] [phone] Overall rating Average 37. 22.7 mi [facility] [address] [place], [place]  [address] [phone] Overall rating Much below average 38. 23.3 mi [facility] [address] [place], [place] [address] [phone] Overall rating Above average 39. 23.5 [place], [place] [address] [phone] Overall rating Not available18 40. 24.1 mi [place] [address] [place], [place] [address] [phone] Overall rating Much below average 41. 24.2 mi [place] [address] [place], [place] [address] [phone] Overall rating Below average 42. 24.4 [facility], [place] [address] [phone] Overall rating Much below average 43. 24.5 mi [facility] [place],  [address] [phone] Overall rating Above average To explore  Expected Discharge Plan: [place] Barriers to Discharge: Continued Medical Work up  Expected Discharge Plan and Services Expected Discharge Plan: [place]   Discharge Planning Services: CM Consult Post Acute Care Choice: [place] arrangements for the past 2 months: Single Family Home                           HH Arranged: PT [place]: [place] Date [place]: 09/20/21 Time [place]: 0370 Representative spoke with at [place]: [place] ([place]) Interventions    Readmission Risk Interventions Readmission Risk Prevention Plan 09/26/2021  Transportation Screening Complete  PCP or Specialist Appt within 3-5 Days Complete  HRI or [place] Complete  Social Work Consult for [place] Planning/Counseling [place] Screening Not Applicable  Medication Review Press photographer) Complete  Some recent data might be hidden

## 2021-10-02 NOTE — Progress Notes (Signed)
PT Cancellation Note  Patient Details Name: SERENA PETTERSON MRN: [id number] DOB: 1951-08-07   Cancelled Treatment:    Reason Eval/Treat Not Completed: Patient declined, no reason specified  Pt tired and requesting to sleep this morning.   Kati L Payson 10/02/2021, 10:09 AM Arlyce Dice, DPT Acute Rehabilitation Services Pager: [phone] Office: [phone]

## 2021-10-02 NOTE — Progress Notes (Signed)
°  Progress Note   Patient: Kathryn Buckley:[id number] DOB: 09-07-1951 DOA: 09/19/2021     12 DOS: the patient was seen and examined on 10/02/2021   Brief hospital course: 71 year old woman COPD admitted for acute on chronic hypoxic respiratory failure secondary to COPD exacerbation, pneumonia and acute systolic CHF.  Complicated hospitalization, did not do well with Lasix.  Palliative care following.  No escalation of care.  Was adamant about going home, but no agreeable to SNF. Plan discharge when has a bed.  Acute on chronic respiratory failure with hypoxia:  -- Down to 4-6 L.  Appears stable.  Multifactorial COPD, pulmonary artery hypertension, CHF.  Patient unable to tolerate BiPAP. -- SNF when bed available   Hypokalemia. -- Resolved   RLL lobar pneumonia --Completed Zithromax, Rocephin.  Resolved   Acute systolic congestive heart failure.   --Treated with Lasix but developed hypotension and AKI.  --Follow volume status.  Fluid balance is negative.  No evidence of volume overload.   AKI superimposed on CKD stage IIIb --Secondary to IV diuretics.  Creatinine trended up during IV diuretics. -- Creatinine stable. Follow-up as an outpatient.   Acute metabolic encephalopathy:  --Multifactorial including respiratory failure, hyperammonemia, consider medications Ativan and Seroquel.  Doses were decreased.  Continue lactulose. --Awake and alert and mentation appears stable.   Anxiety and depression --Dose of Ativan and Seroquel has been decreased.  Stable   Hypothyroidism --Continue Synthroid    Tobacco use disorder:  --Was smoking 2 packs a day.  On nicotine patch.     Elevated liver enzymes  --Likely congestive hepatomegaly/ischemic insult in the setting of hypotension.    Acute hepatitis panel negative.  Hold statin -- follow-up as an outpatient   Thrombocytopenia --stable, etiology unclear, follow up as outpatient   History of third-degree AVB --Status post  permanent pacemaker.     Goal of care: DNR/DNI.   --Patient with multiple medical comorbidities and organ failure. Plan is no escalation of care no BiPAP or higher level of care.    Assessment and Plan No notes have been filed under this hospital service. Service: Hospitalist    Subjective:  Feels ok Breathing fine  Objective Vital signs were reviewed and unremarkable. Physical Exam Vitals reviewed.  Constitutional:      General: She is not in acute distress.    Appearance: She is not ill-appearing or toxic-appearing.  Cardiovascular:     Rate and Rhythm: Normal rate and regular rhythm.     Heart sounds: No murmur heard. Pulmonary:     Effort: Respiratory distress: mild.     Breath sounds: No wheezing, rhonchi or rales.     Comments: Mild increased work of breathing Psychiatric:        Mood and Affect: Mood normal.        Behavior: Behavior normal.     Data Reviewed:  CBG stable  Family Communication: none  Disposition: Status is: Inpatient  Remains inpatient appropriate because: awaits SNF         Time spent: 25 minutes  Author: Murray Hodgkins, MD 10/02/2021 3:53 PM  For on call review [email or web address].

## 2021-10-03 DIAGNOSIS — J189 Pneumonia, unspecified organism: Secondary | ICD-10-CM | POA: Diagnosis not present

## 2021-10-03 LAB — COMPREHENSIVE METABOLIC PANEL
ALT: 79 U/L — ABNORMAL HIGH (ref 0–44)
AST: 22 U/L (ref 15–41)
Albumin: 3.6 g/dL (ref 3.5–5.0)
Alkaline Phosphatase: 131 U/L — ABNORMAL HIGH (ref 38–126)
Anion gap: 10 (ref 5–15)
BUN: 54 mg/dL — ABNORMAL HIGH (ref 8–23)
CO2: 24 mmol/L (ref 22–32)
Calcium: 8.3 mg/dL — ABNORMAL LOW (ref 8.9–10.3)
Chloride: 108 mmol/L (ref 98–111)
Creatinine, Ser: 2.44 mg/dL — ABNORMAL HIGH (ref 0.44–1.00)
GFR, Estimated: 21 mL/min — ABNORMAL LOW (ref >60)
Glucose, Bld: 145 mg/dL — ABNORMAL HIGH (ref 70–99)
Potassium: 4 mmol/L (ref 3.5–5.1)
Sodium: 142 mmol/L (ref 135–145)
Total Bilirubin: 0.9 mg/dL (ref 0.3–1.2)
Total Protein: 5.9 g/dL — ABNORMAL LOW (ref 6.5–8.1)

## 2021-10-03 LAB — CBC
HCT: 41.1 % (ref 36.0–46.0)
Hemoglobin: 12.3 g/dL (ref 12.0–15.0)
MCH: 29.1 pg (ref 26.0–34.0)
MCHC: 29.9 g/dL — ABNORMAL LOW (ref 30.0–36.0)
MCV: 97.4 fL (ref 80.0–100.0)
Platelets: 73 10*3/uL — ABNORMAL LOW (ref 150–400)
RBC: 4.22 MIL/uL (ref 3.87–5.11)
RDW: 18.2 % — ABNORMAL HIGH (ref 11.5–15.5)
WBC: 6.7 10*3/uL (ref 4.0–10.5)
nRBC: 0.3 % — ABNORMAL HIGH (ref 0.0–0.2)

## 2021-10-03 LAB — GLUCOSE, CAPILLARY
Glucose-Capillary: 123 mg/dL — ABNORMAL HIGH (ref 70–99)
Glucose-Capillary: 123 mg/dL — ABNORMAL HIGH (ref 70–99)
Glucose-Capillary: 139 mg/dL — ABNORMAL HIGH (ref 70–99)
Glucose-Capillary: 172 mg/dL — ABNORMAL HIGH (ref 70–99)
Glucose-Capillary: 182 mg/dL — ABNORMAL HIGH (ref 70–99)

## 2021-10-03 MED ORDER — LEVOTHYROXINE SODIUM 125 MCG PO TABS: 125.0000 ug | ORAL_TABLET | Freq: Every day | ORAL | 0 refills | Status: AC

## 2021-10-03 MED ORDER — QUETIAPINE FUMARATE 100 MG PO TABS: 100.0000 mg | ORAL_TABLET | Freq: Every day | ORAL | 0 refills | Status: AC

## 2021-10-03 MED ORDER — PREDNISONE 10 MG PO TABS: ORAL_TABLET | ORAL | 0 refills | Status: AC

## 2021-10-03 NOTE — Progress Notes (Signed)
Patient confused and wanting to go home and put her clothes on.  She is going from bradycardic 30's-50's to tachycardic 110-120's.  Patient has a pacemaker.  MD informed earlier about her bradycardia.  Obtaining 12 lead EKG to confirm; leads changed and connections on stepdown monitor checked as well.

## 2021-10-03 NOTE — TOC Transition Note (Addendum)
Transition of Care [facility]) - CM/SW Discharge Note   Patient Details  Name: Kathryn Buckley MRN: [id number] Date of Birth: Nov 01, 1950  Transition of Care [facility]) CM/SW Contact:  Dessa Phi, RN Phone Number: 10/03/2021, 11:45 AM   Clinical Narrative:  d/c to [facility] aware of receiving auth, & if bed available today. Await d/c summary,rm#,tel# report. PTAR.  [place] rep [place]-state surveryors present therefor no admissions until tomorrow. MD updated.    Final next level of care: Skilled Nursing Facility Barriers to Discharge: No Barriers Identified   Patient Goals and CMS Choice Patient states their goals for this hospitalization and ongoing recovery are:: go home CMS Medicare.gov Compare Post Acute Care list provided to:: Patient Represenative (must comment) Choice offered to / list presented to : Sibling  Discharge Placement                       Discharge Plan and Services   Discharge Planning Services: CM Consult Post Acute Care Choice: Home Health                    HH Arranged: PT [place]: [place] Date [place]: 09/20/21 Time [place]: 0092 Representative spoke with at [place]: [place] ([name]) Interventions     Readmission Risk Interventions Readmission Risk Prevention Plan 09/26/2021  Transportation Screening Complete  PCP or Specialist Appt within 3-5 Days Complete  HRI or [place] Complete  Social Work Consult for [place] Planning/Counseling Complete  Palliative Care Screening Not Applicable  Medication Review Press photographer) Complete  Some recent data might be hidden

## 2021-10-03 NOTE — Progress Notes (Signed)
SATURATION QUALIFICATIONS:   Patient Saturations on Room Air at Rest = 71%  Patient Saturations on Room Air while Ambulating = not tested  Patient Saturations on 5 Liters of oxygen while Ambulating = 70%, at rest 98% on 5L [place].  Coolidge Breeze, RN 10/03/2021

## 2021-10-03 NOTE — Discharge Summary (Signed)
Physician Discharge Summary  Kathryn Buckley JJK:[id number] DOB: 1951-02-06 DOA: 09/19/2021  PCP: Debbrah Alar, NP  Admit date: 09/19/2021 Discharge date: 10/03/2021  Admitted From: Home Disposition: SNF  Recommendations for Outpatient Follow-up:  Follow up with PCP in 1-2 weeks Please obtain BMP/CBC in one week  Discharge Condition: Stable CODE STATUS: Full Diet recommendation: Low-salt low-fat diet  Brief/Interim Summary: 71 year old woman COPD admitted for acute on chronic hypoxic respiratory failure secondary to COPD exacerbation, pneumonia and acute systolic CHF.  Complicated hospitalization, did not do well with Lasix.  Palliative care following.  No escalation of care.  Was initially adamant about going home, but now agreeable to SNF given ongoing and profound dyspnea with exertion.    Assessment/Plan:   Acute on chronic respiratory failure with hypoxia:  -- Down to 4-6 L nasal cannula at rest, continues to desat with ambulation. - Multifactorial secondary to acute COPD exacerbation, chronic pulmonary artery hypertension, CHF exacerbation now improving. Patient unable to tolerate BiPAP. -Discharge with steroid taper, continue nebs, physical therapy   Hypokalemia. -- Resolved   RLL lobar pneumonia, POA, resolved --Completed Zithromax, Rocephin.  Resolved   Acute systolic congestive heart failure, resolving   --Treated with Lasix but developed hypotension and AKI.  --Follow volume status.  Fluid balance is negative.  No evidence of volume overload.   AKI superimposed on CKD stage IIIb --Secondary to IV diuretics -stabilizing   Acute metabolic encephalopathy, resolved:  - Multifactorial including respiratory failure, hyperammonemia, as well as polypharmacy given improvement with decreased Seroquel and discontinuation of Ativan  - Continue lactulose. - Awake and alert and mentation appears stable.   Anxiety and depression -- Continue Seroquel    Hypothyroidism --Continue Synthroid    Tobacco use disorder:  --Was smoking 2 packs a day.  On nicotine patch.     Elevated liver enzymes  --Likely congestive hepatomegaly/ischemic insult in the setting of hypotension.    Acute hepatitis panel negative.  Hold statin -- follow-up as an outpatient   Thrombocytopenia --stable, etiology unclear, follow up as outpatient   History of third-degree AVB --Status post permanent pacemaker.      Discharge Instructions   Allergies as of 10/03/2021       Reactions   Lamictal [lamotrigine] Rash   Codeine    Other reaction(s): GI Upset (intolerance)        Medication List     STOP taking these medications    acetaminophen 650 MG CR tablet Commonly known as: TYLENOL   amLODipine 5 MG tablet Commonly known as: NORVASC   atorvastatin 40 MG tablet Commonly known as: LIPITOR   hydrOXYzine 10 MG tablet Commonly known as: ATARAX   varenicline 0.5 MG tablet Commonly known as: CHANTIX       TAKE these medications    albuterol 108 (90 Base) MCG/ACT inhaler Commonly known as: VENTOLIN HFA Inhale 2 puffs into the lungs every 6 (six) hours as needed for wheezing or shortness of breath.   carbamazepine 100 MG 12 hr tablet Commonly known as: TEGRETOL XR Take 1 tablet (100 mg total) by mouth 2 (two) times daily.   FLUoxetine 40 MG capsule Commonly known as: PROzac Take 1 capsule (40 mg total) by mouth daily.   fluticasone 50 MCG/ACT nasal spray Commonly known as: FLONASE Place 2 sprays into both nostrils daily.   levothyroxine 125 MCG tablet Commonly known as: SYNTHROID Take 1 tablet (125 mcg total) by mouth daily at 6 (six) AM. Start taking on: October 04, 2021 What changed:  See the new instructions.   omeprazole 40 MG capsule Commonly known as: PRILOSEC TAKE 1 CAPSULE(40 MG) BY MOUTH DAILY What changed: See the new instructions.   predniSONE 10 MG tablet Commonly known as: DELTASONE Take 4 tablets (40 mg total)  by mouth daily for 3 days, THEN 3 tablets (30 mg total) daily for 3 days, THEN 2 tablets (20 mg total) daily for 3 days, THEN 1 tablet (10 mg total) daily for 3 days. Start taking on: October 03, 2021   pseudoephedrine-guaifenesin 60-600 MG 12 hr tablet Commonly known as: MUCINEX D Take 1 tablet by mouth daily as needed for congestion (cough).   QUEtiapine 100 MG tablet Commonly known as: SEROQUEL Take 1 tablet (100 mg total) by mouth at bedtime. What changed:  medication strength how much to take how to take this when to take this additional instructions   Shingrix injection Generic drug: Zoster Vaccine Adjuvanted Inject 0.5mg  IM now and again in 2-6 months.   Trelegy Ellipta 100-62.5-25 MCG/ACT Aepb Generic drug: Fluticasone-Umeclidin-Vilant Take 1 puff by mouth daily.        Contact information for follow-up providers     [facility], [facility] Follow up.   Specialty: Home Health Services Why: [facility] physical therapy Contact information: [place] [address] [phone]         [facility] Follow up.   Specialty: PALLIATIVE CARE Why: outpatient palliative care services. Contact information: [place] [phone]             Contact information for after-discharge care     Destination     [facility] .   Service: Skilled Nursing Contact information: [place] [address] [phone]                    Allergies  Allergen Reactions   Lamictal [Lamotrigine] Rash   Codeine     Other reaction(s): GI Upset (intolerance)   Procedures/Studies: DG Chest 2 View  Result Date: 09/19/2021 CLINICAL DATA:  Cough EXAM: CHEST - 2 VIEW COMPARISON:  Earlier today FINDINGS: Left chest wall pacer device is noted with leads in the right atrial appendage and right ventricle. Stable cardiomediastinal contours. Small bilateral pleural  effusions are identified and there is pulmonary vascular congestion. Airspace disease is identified within the right lower lobe. Atelectasis noted in the left lower lobe. Remote healed right posterior 6th rib fracture. IMPRESSION: 1. Suspect right lower lobe pneumonia. 2. Small bilateral pleural effusions and pulmonary vascular congestion. Electronically Signed   By: Kerby Moors M.D.   On: 09/19/2021 14:18   US RENAL  Result Date: 09/23/2021 CLINICAL DATA:  Acute renal injury. EXAM: RENAL / URINARY TRACT ULTRASOUND COMPLETE COMPARISON:  None. FINDINGS: Right Kidney: Renal measurements: 10.2 cm x 4.4 cm x 6.3 cm = volume: 148.5 mL. Diffusely increased echogenicity of the renal parenchyma is noted. No mass or hydronephrosis visualized. Left Kidney: Renal measurements: 10.4 cm x 5.3 cm x 5.8 cm = volume: 167.7 mL. Diffusely increased echogenicity of the renal parenchyma is noted. No mass or hydronephrosis visualized. Bladder: A Foley catheter is in place. Other: A trace amount of ascites is seen adjacent to the liver and spleen. IMPRESSION: 1. Increased renal echogenicity which may be secondary to medical renal disease. 2. Trace amount of ascites within the upper abdomen. Electronically Signed   By: Virgina Norfolk M.D.   On: 09/23/2021 20:47   DG Chest [facility]  1 View  Result Date: 09/21/2021 CLINICAL DATA:  Nausea, chest pain 8/10, COPD, tobacco abuse EXAM: PORTABLE CHEST 1 VIEW COMPARISON:  09/19/2021 FINDINGS: Single frontal view of the chest demonstrates stable dual lead pacer. Cardiac silhouette is mildly enlarged but stable. Persistent consolidation at the medial right lung base. No large effusion. No pneumothorax. Left chest is clear. No acute bony abnormalities. IMPRESSION: 1. Stable right basilar consolidation compatible with pneumonia. Electronically Signed   By: Randa Ngo M.D.   On: 09/21/2021 18:11   DG Chest Port 1 View  Result Date: 09/19/2021 CLINICAL DATA:  Cough, shortness of  breath. EXAM: PORTABLE CHEST 1 VIEW COMPARISON:  March 16, 2016. FINDINGS: Dual lead pacer device in place.  EKG leads project over the chest. Extreme lung bases excluded from view. Signs of bibasilar airspace disease in graded opacities at the RIGHT and LEFT lung base. LEFT hemidiaphragm not imaged. No visible pneumothorax. Visualized cardiomediastinal contours are grossly stable but obscured at the RIGHT lung base due to dense basilar airspace disease. On limited assessment there is no acute skeletal process. IMPRESSION: 1. Signs of bibasilar airspace disease and graded opacities at the RIGHT and LEFT lung base. Dense basilar opacity on the RIGHT is suspicious for pneumonia and is partially visualized. Airspace disease at the bases likely associated with pleural fluid. 2. Most inferior aspect of the chest, lung bases excluded from view. Consider repeat imaging of the chest to include the lung bases. Electronically Signed   By: Zetta Bills M.D.   On: 09/19/2021 12:12   ECHOCARDIOGRAM COMPLETE  Result Date: 09/20/2021    ECHOCARDIOGRAM REPORT   Patient Name:   RABIAH GOESER Date of Exam: 09/20/2021 Medical Rec #:  [id number]           Height:       69.0 in Accession #:    [id number]          Weight:       160.9 lb Date of Birth:  07-Jun-1951           BSA:          1.884 m Patient Age:    71 years            BP:           121/87 mmHg Patient Gender: F                   HR:           88 bpm. Exam Location:  Inpatient Procedure: 2D Echo, Color Doppler and Cardiac Doppler Indications:    R06.9 DOE  History:        Patient has no prior history of Echocardiogram examinations.                 Pacemaker, COPD; Risk Factors:Hypertension and Dyslipidemia.  Sonographer:    Raquel Sarna Senior RDCS Referring Phys: [id number] [place]  Sonographer Comments: Technically difficult due to COPD, EKG not tracking accurately IMPRESSIONS  1. Global hypokinesis with inferior and inferoseptal akinesis. Left ventricular ejection  fraction, by estimation, is 30 to 35%. The left ventricle has moderately decreased function. The left ventricle demonstrates global hypokinesis. The left ventricular internal cavity size was mildly dilated. Left ventricular diastolic parameters are indeterminate.  2. Right ventricular systolic function is mildly reduced. The right ventricular size is normal. There is moderately elevated pulmonary artery systolic pressure.  3. Left atrial size was severely dilated.  4. Right atrial size was severely  dilated.  5. The mitral valve is normal in structure. Mild to moderate mitral valve regurgitation. No evidence of mitral stenosis.  6. The aortic valve is normal in structure. Aortic valve regurgitation is not visualized. No aortic stenosis is present.  7. Aortic dilatation noted. There is mild dilatation of the ascending aorta, measuring 36 mm.  8. The inferior vena cava is dilated in size with <50% respiratory variability, suggesting right atrial pressure of 15 mmHg. FINDINGS  Left Ventricle: Global hypokinesis with inferior and inferoseptal akinesis. Left ventricular ejection fraction, by estimation, is 30 to 35%. The left ventricle has moderately decreased function. The left ventricle demonstrates global hypokinesis. The left ventricular internal cavity size was mildly dilated. There is no left ventricular hypertrophy. Left ventricular diastolic parameters are indeterminate. Right Ventricle: The right ventricular size is normal. No increase in right ventricular wall thickness. Right ventricular systolic function is mildly reduced. There is moderately elevated pulmonary artery systolic pressure. The tricuspid regurgitant velocity is 2.92 m/s, and with an assumed right atrial pressure of 15 mmHg, the estimated right ventricular systolic pressure is 08.6 mmHg. Left Atrium: Left atrial size was severely dilated. Right Atrium: Right atrial size was severely dilated. Pericardium: There is no evidence of pericardial  effusion. Mitral Valve: The mitral valve is normal in structure. Mild to moderate mitral valve regurgitation. No evidence of mitral valve stenosis. Tricuspid Valve: The tricuspid valve is normal in structure. Tricuspid valve regurgitation is mild . No evidence of tricuspid stenosis. Aortic Valve: The aortic valve is normal in structure. Aortic valve regurgitation is not visualized. No aortic stenosis is present. Pulmonic Valve: The pulmonic valve was normal in structure. Pulmonic valve regurgitation is not visualized. No evidence of pulmonic stenosis. Aorta: Aortic dilatation noted. There is mild dilatation of the ascending aorta, measuring 36 mm. Venous: The inferior vena cava is dilated in size with less than 50% respiratory variability, suggesting right atrial pressure of 15 mmHg. IAS/Shunts: No atrial level shunt detected by color flow Doppler.  LEFT VENTRICLE PLAX 2D LVIDd:         6.10 cm   Diastology LVIDs:         5.10 cm   LV e' medial:    3.81 cm/s LV PW:         1.00 cm   LV E/e' medial:  29.7 LV IVS:        0.90 cm   LV e' lateral:   10.30 cm/s LVOT diam:     1.90 cm   LV E/e' lateral: 11.0 LV SV:         43 LV SV Index:   23 LVOT Area:     2.84 cm  RIGHT VENTRICLE RV S prime:     8.81 cm/s TAPSE (M-mode): 1.8 cm LEFT ATRIUM             Index        RIGHT ATRIUM           Index LA diam:        5.50 cm 2.92 cm/m   RA Area:     24.40 cm LA Vol (A2C):   90.7 ml 48.15 ml/m  RA Volume:   87.50 ml  46.45 ml/m LA Vol (A4C):   78.9 ml 41.88 ml/m LA Biplane Vol: 87.8 ml 46.61 ml/m  AORTIC VALVE LVOT Vmax:   102.00 cm/s LVOT Vmean:  63.300 cm/s LVOT VTI:    0.150 m  AORTA Ao Root diam: 2.50 cm Ao Asc diam:  3.60 cm MR Peak grad:    85.4 mmHg    TRICUSPID VALVE MR Mean grad:    54.0 mmHg    TR Peak grad:   34.1 mmHg MR Vmax:         462.00 cm/s  TR Vmax:        292.00 cm/s MR Vmean:        350.0 cm/s MR PISA:         1.57 cm     SHUNTS MR PISA Eff ROA: 13 mm       Systemic VTI:  0.15 m MR PISA Radius:   0.50 cm      Systemic Diam: 1.90 cm MV E velocity: 113.00 cm/s Skeet Latch MD Electronically signed by Skeet Latch MD Signature Date/Time: 09/20/2021/5:55:25 PM    Final      Subjective: No acute issues or events overnight denies nausea vomiting diarrhea constipation headache fevers chills or chest pain   Discharge Exam: Vitals:   10/03/21 0453 10/03/21 1030  BP: (!) 136/108   Pulse:    Resp: (!) 21 (!) 21  Temp:    SpO2:     Vitals:   10/03/21 0452 10/03/21 0453 10/03/21 0457 10/03/21 1030  BP:  (!) 136/108    Pulse:      Resp:  (!) 21  (!) 21  Temp: (!) 97.5 F (36.4 C)     TempSrc: Axillary     SpO2: 93%     Weight:   78.9 kg   Height:        General: Pt is alert, awake, not in acute distress Cardiovascular: RRR, S1/S2 +, no rubs, no gallops Respiratory: CTA bilaterally, no wheezing, no rhonchi Abdominal: Soft, NT, ND, bowel sounds + Extremities: no edema, no cyanosis    The results of significant diagnostics from this hospitalization (including imaging, microbiology, ancillary and laboratory) are listed below for reference.     Microbiology: Recent Results (from the past 240 hour(s))  SARS CORONAVIRUS 2 (TAT 6-24 HRS) Nasopharyngeal Nasopharyngeal Swab     Status: None   Collection Time: 10/02/21 12:51 PM   Specimen: Nasopharyngeal Swab  Result Value Ref Range Status   SARS Coronavirus 2 NEGATIVE NEGATIVE Final    Comment: (NOTE) SARS-CoV-2 target nucleic acids are NOT DETECTED.  The SARS-CoV-2 RNA is generally detectable in upper and lower respiratory specimens during the acute phase of infection. Negative results do not preclude SARS-CoV-2 infection, do not rule out co-infections with other pathogens, and should not be used as the sole basis for treatment or other patient management decisions. Negative results must be combined with clinical observations, patient history, and epidemiological information. The expected result is Negative.  Fact  Sheet for Patients: SugarRoll.be  Fact Sheet for Healthcare Providers: https://www.woods-mathews.com/  This test is not yet approved or cleared by the [place] FDA and  has been authorized for detection and/or diagnosis of SARS-CoV-2 by FDA under an Emergency Use Authorization (EUA). This EUA will remain  in effect (meaning this test can be used) for the duration of the COVID-19 declaration under Se ction 564(b)(1) of the Act, 21 U.S.C. section 360bbb-3(b)(1), unless the authorization is terminated or revoked sooner.  Performed at [facility], [place] [address] [place], [place] [address]      Labs: BNP (last 3 results) Recent Labs    09/19/21 2034  BNP 8,101.7*   Basic Metabolic Panel: Recent Labs  Lab 09/27/21 0527 09/29/21 0515 09/30/21 5102 10/01/21 0447 10/03/21 0435  NA 141 144 146* 143 142  K 3.4* 3.4* 3.5 3.2* 4.0  CL 103 106 107 110 108  CO2 26 29 30 29 24   GLUCOSE 221* 186* 147* 142* 145*  BUN 55* 52* 56* 53* 54*  CREATININE 2.52* 2.17* 2.27* 2.26* 2.44*  CALCIUM 7.7* 8.1* 8.0* 7.8* 8.3*  MG 2.3  --   --   --   --   PHOS 4.7*  --   --   --   --    Liver Function Tests: Recent Labs  Lab 09/27/21 0527 10/03/21 0435  AST 102* 22  ALT 327* 79*  ALKPHOS 154* 131*  BILITOT 0.6 0.9  PROT 5.6* 5.9*  ALBUMIN 3.3* 3.6   No results for input(s): LIPASE, AMYLASE in the last 168 hours. No results for input(s): AMMONIA in the last 168 hours. CBC: Recent Labs  Lab 09/27/21 0527 09/29/21 0515 10/03/21 0506  WBC 5.4 4.8 6.7  HGB 11.8* 11.9* 12.3  HCT 38.6 40.0 41.1  MCV 95.1 96.4 97.4  PLT 95* 89* 73*   Cardiac Enzymes: No results for input(s): CKTOTAL, CKMB, CKMBINDEX, TROPONINI in the last 168 hours. BNP: Invalid input(s): POCBNP CBG: Recent Labs  Lab 10/02/21 1651 10/02/21 2047 10/03/21 0043 10/03/21 0408 10/03/21 0809  GLUCAP 173* 213* 123* 123* 182*   D-Dimer No results for input(s):  DDIMER in the last 72 hours. Hgb A1c No results for input(s): HGBA1C in the last 72 hours. Lipid Profile No results for input(s): CHOL, HDL, LDLCALC, TRIG, CHOLHDL, LDLDIRECT in the last 72 hours. Thyroid function studies No results for input(s): TSH, T4TOTAL, T3FREE, THYROIDAB in the last 72 hours.  Invalid input(s): FREET3 Anemia work up No results for input(s): VITAMINB12, FOLATE, FERRITIN, TIBC, IRON, RETICCTPCT in the last 72 hours. Urinalysis    Component Value Date/Time   COLORURINE STRAW (A) 09/19/2021 0037   APPEARANCEUR CLEAR 09/19/2021 0037   LABSPEC 1.004 (L) 09/19/2021 0037   PHURINE 5.0 09/19/2021 0037   GLUCOSEU NEGATIVE 09/19/2021 0037   HGBUR SMALL (A) 09/19/2021 0037   BILIRUBINUR NEGATIVE 09/19/2021 0037   BILIRUBINUR neg 07/16/2016 1154   KETONESUR NEGATIVE 09/19/2021 0037   PROTEINUR NEGATIVE 09/19/2021 0037   UROBILINOGEN 1.0 07/16/2016 1154   NITRITE NEGATIVE 09/19/2021 0037   LEUKOCYTESUR NEGATIVE 09/19/2021 0037   Sepsis Labs Invalid input(s): PROCALCITONIN,  WBC,  LACTICIDVEN Microbiology Recent Results (from the past 240 hour(s))  SARS CORONAVIRUS 2 (TAT 6-24 HRS) Nasopharyngeal Nasopharyngeal Swab     Status: None   Collection Time: 10/02/21 12:51 PM   Specimen: Nasopharyngeal Swab  Result Value Ref Range Status   SARS Coronavirus 2 NEGATIVE NEGATIVE Final    Comment: (NOTE) SARS-CoV-2 target nucleic acids are NOT DETECTED.  The SARS-CoV-2 RNA is generally detectable in upper and lower respiratory specimens during the acute phase of infection. Negative results do not preclude SARS-CoV-2 infection, do not rule out co-infections with other pathogens, and should not be used as the sole basis for treatment or other patient management decisions. Negative results must be combined with clinical observations, patient history, and epidemiological information. The expected result is Negative.  Fact Sheet for  Patients: SugarRoll.be  Fact Sheet for Healthcare Providers: https://www.woods-mathews.com/  This test is not yet approved or cleared by the [place] FDA and  has been authorized for detection and/or diagnosis of SARS-CoV-2 by FDA under an Emergency Use Authorization (EUA). This EUA will remain  in effect (meaning this test can be used) for the duration of the COVID-19  declaration under Se ction 564(b)(1) of the Act, 21 U.S.C. section 360bbb-3(b)(1), unless the authorization is terminated or revoked sooner.  Performed at [facility], [place] [address], [place],  [address]      Time coordinating discharge: Over 30 minutes  SIGNED:   Little Ishikawa, DO [facility] 10/03/2021, 11:59 AM Pager   If 7PM-7AM, please contact night-coverage [email or web address]

## 2021-10-04 DIAGNOSIS — J189 Pneumonia, unspecified organism: Secondary | ICD-10-CM | POA: Diagnosis not present

## 2021-10-04 DIAGNOSIS — R7401 Elevation of levels of liver transaminase levels: Secondary | ICD-10-CM | POA: Diagnosis not present

## 2021-10-04 DIAGNOSIS — J449 Chronic obstructive pulmonary disease, unspecified: Secondary | ICD-10-CM | POA: Diagnosis not present

## 2021-10-04 DIAGNOSIS — J439 Emphysema, unspecified: Secondary | ICD-10-CM | POA: Diagnosis not present

## 2021-10-04 DIAGNOSIS — E1151 Type 2 diabetes mellitus with diabetic peripheral angiopathy without gangrene: Secondary | ICD-10-CM | POA: Diagnosis not present

## 2021-10-04 DIAGNOSIS — L97521 Non-pressure chronic ulcer of other part of left foot limited to breakdown of skin: Secondary | ICD-10-CM | POA: Diagnosis not present

## 2021-10-04 DIAGNOSIS — J9621 Acute and chronic respiratory failure with hypoxia: Secondary | ICD-10-CM | POA: Diagnosis not present

## 2021-10-04 DIAGNOSIS — I509 Heart failure, unspecified: Secondary | ICD-10-CM | POA: Diagnosis not present

## 2021-10-04 DIAGNOSIS — N1832 Chronic kidney disease, stage 3b: Secondary | ICD-10-CM | POA: Diagnosis not present

## 2021-10-04 DIAGNOSIS — D696 Thrombocytopenia, unspecified: Secondary | ICD-10-CM | POA: Diagnosis not present

## 2021-10-04 DIAGNOSIS — N170 Acute kidney failure with tubular necrosis: Secondary | ICD-10-CM | POA: Diagnosis not present

## 2021-10-04 DIAGNOSIS — R54 Age-related physical debility: Secondary | ICD-10-CM | POA: Diagnosis not present

## 2021-10-04 DIAGNOSIS — J9811 Atelectasis: Secondary | ICD-10-CM | POA: Diagnosis not present

## 2021-10-04 DIAGNOSIS — J441 Chronic obstructive pulmonary disease with (acute) exacerbation: Secondary | ICD-10-CM | POA: Diagnosis not present

## 2021-10-04 DIAGNOSIS — Z9981 Dependence on supplemental oxygen: Secondary | ICD-10-CM | POA: Diagnosis not present

## 2021-10-04 DIAGNOSIS — I517 Cardiomegaly: Secondary | ICD-10-CM | POA: Diagnosis not present

## 2021-10-04 DIAGNOSIS — F17200 Nicotine dependence, unspecified, uncomplicated: Secondary | ICD-10-CM | POA: Diagnosis not present

## 2021-10-04 DIAGNOSIS — I130 Hypertensive heart and chronic kidney disease with heart failure and stage 1 through stage 4 chronic kidney disease, or unspecified chronic kidney disease: Secondary | ICD-10-CM | POA: Diagnosis not present

## 2021-10-04 DIAGNOSIS — E785 Hyperlipidemia, unspecified: Secondary | ICD-10-CM | POA: Diagnosis not present

## 2021-10-04 DIAGNOSIS — G9341 Metabolic encephalopathy: Secondary | ICD-10-CM | POA: Diagnosis not present

## 2021-10-04 DIAGNOSIS — R627 Adult failure to thrive: Secondary | ICD-10-CM | POA: Diagnosis not present

## 2021-10-04 DIAGNOSIS — N179 Acute kidney failure, unspecified: Secondary | ICD-10-CM | POA: Diagnosis not present

## 2021-10-04 DIAGNOSIS — Z7401 Bed confinement status: Secondary | ICD-10-CM | POA: Diagnosis not present

## 2021-10-04 DIAGNOSIS — E08319 Diabetes mellitus due to underlying condition with unspecified diabetic retinopathy without macular edema: Secondary | ICD-10-CM | POA: Diagnosis not present

## 2021-10-04 DIAGNOSIS — I110 Hypertensive heart disease with heart failure: Secondary | ICD-10-CM | POA: Diagnosis not present

## 2021-10-04 DIAGNOSIS — R0602 Shortness of breath: Secondary | ICD-10-CM | POA: Diagnosis not present

## 2021-10-04 DIAGNOSIS — F32A Depression, unspecified: Secondary | ICD-10-CM | POA: Diagnosis not present

## 2021-10-04 DIAGNOSIS — I5020 Unspecified systolic (congestive) heart failure: Secondary | ICD-10-CM | POA: Diagnosis not present

## 2021-10-04 DIAGNOSIS — R739 Hyperglycemia, unspecified: Secondary | ICD-10-CM | POA: Diagnosis not present

## 2021-10-04 DIAGNOSIS — E7220 Disorder of urea cycle metabolism, unspecified: Secondary | ICD-10-CM | POA: Diagnosis not present

## 2021-10-04 DIAGNOSIS — T502X5A Adverse effect of carbonic-anhydrase inhibitors, benzothiadiazides and other diuretics, initial encounter: Secondary | ICD-10-CM | POA: Diagnosis not present

## 2021-10-04 DIAGNOSIS — I248 Other forms of acute ischemic heart disease: Secondary | ICD-10-CM | POA: Diagnosis not present

## 2021-10-04 DIAGNOSIS — R609 Edema, unspecified: Secondary | ICD-10-CM | POA: Diagnosis not present

## 2021-10-04 DIAGNOSIS — I119 Hypertensive heart disease without heart failure: Secondary | ICD-10-CM | POA: Diagnosis not present

## 2021-10-04 DIAGNOSIS — N183 Chronic kidney disease, stage 3 unspecified: Secondary | ICD-10-CM | POA: Diagnosis not present

## 2021-10-04 DIAGNOSIS — E875 Hyperkalemia: Secondary | ICD-10-CM | POA: Diagnosis not present

## 2021-10-04 DIAGNOSIS — E1122 Type 2 diabetes mellitus with diabetic chronic kidney disease: Secondary | ICD-10-CM | POA: Diagnosis not present

## 2021-10-04 DIAGNOSIS — I442 Atrioventricular block, complete: Secondary | ICD-10-CM | POA: Diagnosis not present

## 2021-10-04 DIAGNOSIS — D649 Anemia, unspecified: Secondary | ICD-10-CM | POA: Diagnosis not present

## 2021-10-04 DIAGNOSIS — K219 Gastro-esophageal reflux disease without esophagitis: Secondary | ICD-10-CM | POA: Diagnosis not present

## 2021-10-04 DIAGNOSIS — I70202 Unspecified atherosclerosis of native arteries of extremities, left leg: Secondary | ICD-10-CM | POA: Diagnosis not present

## 2021-10-04 DIAGNOSIS — N289 Disorder of kidney and ureter, unspecified: Secondary | ICD-10-CM | POA: Diagnosis not present

## 2021-10-04 DIAGNOSIS — E876 Hypokalemia: Secondary | ICD-10-CM | POA: Diagnosis not present

## 2021-10-04 DIAGNOSIS — E039 Hypothyroidism, unspecified: Secondary | ICD-10-CM | POA: Diagnosis not present

## 2021-10-04 DIAGNOSIS — R160 Hepatomegaly, not elsewhere classified: Secondary | ICD-10-CM | POA: Diagnosis not present

## 2021-10-04 DIAGNOSIS — Z20822 Contact with and (suspected) exposure to covid-19: Secondary | ICD-10-CM | POA: Diagnosis not present

## 2021-10-04 DIAGNOSIS — D61818 Other pancytopenia: Secondary | ICD-10-CM | POA: Diagnosis not present

## 2021-10-04 DIAGNOSIS — J9611 Chronic respiratory failure with hypoxia: Secondary | ICD-10-CM | POA: Diagnosis not present

## 2021-10-04 DIAGNOSIS — E11621 Type 2 diabetes mellitus with foot ulcer: Secondary | ICD-10-CM | POA: Diagnosis not present

## 2021-10-04 DIAGNOSIS — I5023 Acute on chronic systolic (congestive) heart failure: Secondary | ICD-10-CM | POA: Diagnosis not present

## 2021-10-04 DIAGNOSIS — L97522 Non-pressure chronic ulcer of other part of left foot with fat layer exposed: Secondary | ICD-10-CM | POA: Diagnosis not present

## 2021-10-04 DIAGNOSIS — I10 Essential (primary) hypertension: Secondary | ICD-10-CM | POA: Diagnosis not present

## 2021-10-04 DIAGNOSIS — I5022 Chronic systolic (congestive) heart failure: Secondary | ICD-10-CM | POA: Diagnosis not present

## 2021-10-04 LAB — GLUCOSE, CAPILLARY: Glucose-Capillary: 183 mg/dL — ABNORMAL HIGH (ref 70–99)

## 2021-10-04 NOTE — Progress Notes (Addendum)
Report called to [place], [phone], spoke with [facility] They are ready to accept patient to room 209B. Clarifying code status with Dr. Avon Gully, I will call facility back to update with that info. Coolidge Breeze, RN 10/04/2021  1115- Pt transported to [facility] via [place] in stable condition. Called [facility] and notified of DNR status, confirmed with MD.  Coolidge Breeze, RN 10/04/2021

## 2021-10-04 NOTE — Discharge Summary (Addendum)
Physician Discharge Summary  Kathryn Buckley NKN:[id number] DOB: 1950-10-04 DOA: 09/19/2021  PCP: Debbrah Alar, NP  Admit date: 09/19/2021 Discharge date: 10/04/2021  Admitted From: Home Disposition: SNF  Recommendations for Outpatient Follow-up:  Follow up with PCP in 1-2 weeks Please obtain BMP/CBC in one week  Discharge Condition: Stable CODE STATUS: DNR Diet recommendation: Low-salt low-fat diet  Brief/Interim Summary: 71 year old woman COPD admitted for acute on chronic hypoxic respiratory failure secondary to COPD exacerbation, pneumonia and acute systolic CHF.  Complicated hospitalization, did not do well with Lasix.  Palliative care following.  No escalation of care.  Was initially adamant about going home, but now agreeable to SNF given ongoing and profound dyspnea with exertion.  Patient remains medically stable for discharge - unable to DC yesterday due to logistical issue at facility/bed availability.   Assessment/Plan:   Acute on chronic respiratory failure with hypoxia:  -- Down to 4-6 L nasal cannula at rest, continues to desat with ambulation. - Multifactorial secondary to acute COPD exacerbation, chronic pulmonary artery hypertension, CHF exacerbation now improving. Patient unable to tolerate BiPAP. -Discharge with steroid taper, continue nebs, physical therapy   Hypokalemia. -- Resolved   RLL lobar pneumonia, POA, resolved --Completed Zithromax, Rocephin.  Resolved   Acute systolic congestive heart failure, resolving   --Treated with Lasix but developed hypotension and AKI.  --Follow volume status.  Fluid balance is negative.  No evidence of volume overload.   AKI superimposed on CKD stage IIIb --Secondary to IV diuretics -stabilizing   Acute metabolic encephalopathy, resolved:  - Multifactorial including respiratory failure, hyperammonemia, as well as polypharmacy given improvement with decreased Seroquel and discontinuation of Ativan  -  Continue lactulose. - Awake and alert and mentation appears stable.   Anxiety and depression -- Continue Seroquel   Hypothyroidism --Continue Synthroid    Tobacco use disorder:  --Was smoking 2 packs a day.  On nicotine patch.     Elevated liver enzymes  --Likely congestive hepatomegaly/ischemic insult in the setting of hypotension.    Acute hepatitis panel negative.  Hold statin -- follow-up as an outpatient   Thrombocytopenia --stable, etiology unclear, follow up as outpatient   History of third-degree AVB --Status post permanent pacemaker.      Discharge Instructions   Allergies as of 10/04/2021       Reactions   Lamictal [lamotrigine] Rash   Codeine    Other reaction(s): GI Upset (intolerance)        Medication List     STOP taking these medications    acetaminophen 650 MG CR tablet Commonly known as: TYLENOL   amLODipine 5 MG tablet Commonly known as: NORVASC   atorvastatin 40 MG tablet Commonly known as: LIPITOR   hydrOXYzine 10 MG tablet Commonly known as: ATARAX   varenicline 0.5 MG tablet Commonly known as: CHANTIX       TAKE these medications    albuterol 108 (90 Base) MCG/ACT inhaler Commonly known as: VENTOLIN HFA Inhale 2 puffs into the lungs every 6 (six) hours as needed for wheezing or shortness of breath.   carbamazepine 100 MG 12 hr tablet Commonly known as: TEGRETOL XR Take 1 tablet (100 mg total) by mouth 2 (two) times daily.   FLUoxetine 40 MG capsule Commonly known as: PROzac Take 1 capsule (40 mg total) by mouth daily.   fluticasone 50 MCG/ACT nasal spray Commonly known as: FLONASE Place 2 sprays into both nostrils daily.   levothyroxine 125 MCG tablet Commonly known as: SYNTHROID Take 1 tablet (  125 mcg total) by mouth daily at 6 (six) AM. What changed: See the new instructions.   omeprazole 40 MG capsule Commonly known as: PRILOSEC TAKE 1 CAPSULE(40 MG) BY MOUTH DAILY What changed: See the new instructions.    predniSONE 10 MG tablet Commonly known as: DELTASONE Take 4 tablets (40 mg total) by mouth daily for 3 days, THEN 3 tablets (30 mg total) daily for 3 days, THEN 2 tablets (20 mg total) daily for 3 days, THEN 1 tablet (10 mg total) daily for 3 days. Start taking on: October 03, 2021   pseudoephedrine-guaifenesin 60-600 MG 12 hr tablet Commonly known as: MUCINEX D Take 1 tablet by mouth daily as needed for congestion (cough).   QUEtiapine 100 MG tablet Commonly known as: SEROQUEL Take 1 tablet (100 mg total) by mouth at bedtime. What changed:  medication strength how much to take how to take this when to take this additional instructions   Shingrix injection Generic drug: Zoster Vaccine Adjuvanted Inject 0.5mg  IM now and again in 2-6 months.   Trelegy Ellipta 100-62.5-25 MCG/ACT Aepb Generic drug: Fluticasone-Umeclidin-Vilant Take 1 puff by mouth daily.        Contact information for follow-up providers     Care, [facility] Follow up.   Specialty: Home Health Services Why: [facility] physical therapy Contact information: [place] [address] [phone]         [facility] Follow up.   Specialty: PALLIATIVE CARE Why: outpatient palliative care services. Contact information: [place] [phone]             Contact information for after-discharge care     Destination     [facility] .   Service: Skilled Nursing Contact information: [place] [address] [phone]                    Allergies  Allergen Reactions   Lamictal [Lamotrigine] Rash   Codeine     Other reaction(s): GI Upset (intolerance)   Procedures/Studies: DG Chest 2 View  Result Date: 09/19/2021 CLINICAL DATA:  Cough EXAM: CHEST - 2 VIEW COMPARISON:  Earlier today FINDINGS: Left chest wall pacer device is noted with leads in the right atrial  appendage and right ventricle. Stable cardiomediastinal contours. Small bilateral pleural effusions are identified and there is pulmonary vascular congestion. Airspace disease is identified within the right lower lobe. Atelectasis noted in the left lower lobe. Remote healed right posterior 6th rib fracture. IMPRESSION: 1. Suspect right lower lobe pneumonia. 2. Small bilateral pleural effusions and pulmonary vascular congestion. Electronically Signed   By: Kerby Moors M.D.   On: 09/19/2021 14:18   US RENAL  Result Date: 09/23/2021 CLINICAL DATA:  Acute renal injury. EXAM: RENAL / URINARY TRACT ULTRASOUND COMPLETE COMPARISON:  None. FINDINGS: Right Kidney: Renal measurements: 10.2 cm x 4.4 cm x 6.3 cm = volume: 148.5 mL. Diffusely increased echogenicity of the renal parenchyma is noted. No mass or hydronephrosis visualized. Left Kidney: Renal measurements: 10.4 cm x 5.3 cm x 5.8 cm = volume: 167.7 mL. Diffusely increased echogenicity of the renal parenchyma is noted. No mass or hydronephrosis visualized. Bladder: A Foley catheter is in place. Other: A trace amount of ascites is seen adjacent to the liver and spleen. IMPRESSION: 1. Increased renal echogenicity which may be secondary to medical renal disease. 2. Trace amount of ascites within the upper abdomen. Electronically Signed   By: Hoover Browns  Houston M.D.   On: 09/23/2021 20:47   DG Chest Port 1 View  Result Date: 09/21/2021 CLINICAL DATA:  Nausea, chest pain 8/10, COPD, tobacco abuse EXAM: PORTABLE CHEST 1 VIEW COMPARISON:  09/19/2021 FINDINGS: Single frontal view of the chest demonstrates stable dual lead pacer. Cardiac silhouette is mildly enlarged but stable. Persistent consolidation at the medial right lung base. No large effusion. No pneumothorax. Left chest is clear. No acute bony abnormalities. IMPRESSION: 1. Stable right basilar consolidation compatible with pneumonia. Electronically Signed   By: Randa Ngo M.D.   On: 09/21/2021 18:11    DG Chest Port 1 View  Result Date: 09/19/2021 CLINICAL DATA:  Cough, shortness of breath. EXAM: PORTABLE CHEST 1 VIEW COMPARISON:  March 16, 2016. FINDINGS: Dual lead pacer device in place.  EKG leads project over the chest. Extreme lung bases excluded from view. Signs of bibasilar airspace disease in graded opacities at the RIGHT and LEFT lung base. LEFT hemidiaphragm not imaged. No visible pneumothorax. Visualized cardiomediastinal contours are grossly stable but obscured at the RIGHT lung base due to dense basilar airspace disease. On limited assessment there is no acute skeletal process. IMPRESSION: 1. Signs of bibasilar airspace disease and graded opacities at the RIGHT and LEFT lung base. Dense basilar opacity on the RIGHT is suspicious for pneumonia and is partially visualized. Airspace disease at the bases likely associated with pleural fluid. 2. Most inferior aspect of the chest, lung bases excluded from view. Consider repeat imaging of the chest to include the lung bases. Electronically Signed   By: Zetta Bills M.D.   On: 09/19/2021 12:12   ECHOCARDIOGRAM COMPLETE  Result Date: 09/20/2021    ECHOCARDIOGRAM REPORT   Patient Name:   Kathryn Buckley Date of Exam: 09/20/2021 Medical Rec #:  [id number]           Height:       69.0 in Accession #:    [id number]          Weight:       160.9 lb Date of Birth:  1951-06-11           BSA:          1.884 m Patient Age:    36 years            BP:           121/87 mmHg Patient Gender: F                   HR:           88 bpm. Exam Location:  Inpatient Procedure: 2D Echo, Color Doppler and Cardiac Doppler Indications:    R06.9 DOE  History:        Patient has no prior history of Echocardiogram examinations.                 Pacemaker, COPD; Risk Factors:Hypertension and Dyslipidemia.  Sonographer:    Raquel Sarna Senior RDCS Referring Phys: [id number] [facility]iding  Sonographer Comments: Technically difficult due to COPD, EKG not tracking accurately IMPRESSIONS  1.  Global hypokinesis with inferior and inferoseptal akinesis. Left ventricular ejection fraction, by estimation, is 30 to 35%. The left ventricle has moderately decreased function. The left ventricle demonstrates global hypokinesis. The left ventricular internal cavity size was mildly dilated. Left ventricular diastolic parameters are indeterminate.  2. Right ventricular systolic function is mildly reduced. The right ventricular size is normal. There is moderately elevated pulmonary artery systolic pressure.  3. Left  atrial size was severely dilated.  4. Right atrial size was severely dilated.  5. The mitral valve is normal in structure. Mild to moderate mitral valve regurgitation. No evidence of mitral stenosis.  6. The aortic valve is normal in structure. Aortic valve regurgitation is not visualized. No aortic stenosis is present.  7. Aortic dilatation noted. There is mild dilatation of the ascending aorta, measuring 36 mm.  8. The inferior vena cava is dilated in size with <50% respiratory variability, suggesting right atrial pressure of 15 mmHg. FINDINGS  Left Ventricle: Global hypokinesis with inferior and inferoseptal akinesis. Left ventricular ejection fraction, by estimation, is 30 to 35%. The left ventricle has moderately decreased function. The left ventricle demonstrates global hypokinesis. The left ventricular internal cavity size was mildly dilated. There is no left ventricular hypertrophy. Left ventricular diastolic parameters are indeterminate. Right Ventricle: The right ventricular size is normal. No increase in right ventricular wall thickness. Right ventricular systolic function is mildly reduced. There is moderately elevated pulmonary artery systolic pressure. The tricuspid regurgitant velocity is 2.92 m/s, and with an assumed right atrial pressure of 15 mmHg, the estimated right ventricular systolic pressure is 51.7 mmHg. Left Atrium: Left atrial size was severely dilated. Right Atrium: Right  atrial size was severely dilated. Pericardium: There is no evidence of pericardial effusion. Mitral Valve: The mitral valve is normal in structure. Mild to moderate mitral valve regurgitation. No evidence of mitral valve stenosis. Tricuspid Valve: The tricuspid valve is normal in structure. Tricuspid valve regurgitation is mild . No evidence of tricuspid stenosis. Aortic Valve: The aortic valve is normal in structure. Aortic valve regurgitation is not visualized. No aortic stenosis is present. Pulmonic Valve: The pulmonic valve was normal in structure. Pulmonic valve regurgitation is not visualized. No evidence of pulmonic stenosis. Aorta: Aortic dilatation noted. There is mild dilatation of the ascending aorta, measuring 36 mm. Venous: The inferior vena cava is dilated in size with less than 50% respiratory variability, suggesting right atrial pressure of 15 mmHg. IAS/Shunts: No atrial level shunt detected by color flow Doppler.  LEFT VENTRICLE PLAX 2D LVIDd:         6.10 cm   Diastology LVIDs:         5.10 cm   LV e' medial:    3.81 cm/s LV PW:         1.00 cm   LV E/e' medial:  29.7 LV IVS:        0.90 cm   LV e' lateral:   10.30 cm/s LVOT diam:     1.90 cm   LV E/e' lateral: 11.0 LV SV:         43 LV SV Index:   23 LVOT Area:     2.84 cm  RIGHT VENTRICLE RV S prime:     8.81 cm/s TAPSE (M-mode): 1.8 cm LEFT ATRIUM             Index        RIGHT ATRIUM           Index LA diam:        5.50 cm 2.92 cm/m   RA Area:     24.40 cm LA Vol (A2C):   90.7 ml 48.15 ml/m  RA Volume:   87.50 ml  46.45 ml/m LA Vol (A4C):   78.9 ml 41.88 ml/m LA Biplane Vol: 87.8 ml 46.61 ml/m  AORTIC VALVE LVOT Vmax:   102.00 cm/s LVOT Vmean:  63.300 cm/s LVOT VTI:  0.150 m  AORTA Ao Root diam: 2.50 cm Ao Asc diam:  3.60 cm MR Peak grad:    85.4 mmHg    TRICUSPID VALVE MR Mean grad:    54.0 mmHg    TR Peak grad:   34.1 mmHg MR Vmax:         462.00 cm/s  TR Vmax:        292.00 cm/s MR Vmean:        350.0 cm/s MR PISA:         1.57 cm      SHUNTS MR PISA Eff ROA: 13 mm       Systemic VTI:  0.15 m MR PISA Radius:  0.50 cm      Systemic Diam: 1.90 cm MV E velocity: 113.00 cm/s Skeet Latch MD Electronically signed by Skeet Latch MD Signature Date/Time: 09/20/2021/5:55:25 PM    Final      Subjective: No acute issues or events overnight denies nausea vomiting diarrhea constipation headache fevers chills or chest pain   Discharge Exam: Vitals:   10/04/21 0400 10/04/21 0531  BP: (!) 132/103   Pulse:  83  Resp:  16  Temp:    SpO2:  90%   Vitals:   10/04/21 0300 10/04/21 0352 10/04/21 0400 10/04/21 0531  BP:   (!) 132/103   Pulse: 70   83  Resp: 17   16  Temp:      TempSrc:      SpO2: 97%   90%  Weight:  78.6 kg    Height:        General: Pt is alert, awake, not in acute distress Cardiovascular: RRR, S1/S2 +, no rubs, no gallops Respiratory: CTA bilaterally, no wheezing, no rhonchi Abdominal: Soft, NT, ND, bowel sounds + Extremities: no edema, no cyanosis    The results of significant diagnostics from this hospitalization (including imaging, microbiology, ancillary and laboratory) are listed below for reference.     Microbiology: Recent Results (from the past 240 hour(s))  SARS CORONAVIRUS 2 (TAT 6-24 HRS) Nasopharyngeal Nasopharyngeal Swab     Status: None   Collection Time: 10/02/21 12:51 PM   Specimen: Nasopharyngeal Swab  Result Value Ref Range Status   SARS Coronavirus 2 NEGATIVE NEGATIVE Final    Comment: (NOTE) SARS-CoV-2 target nucleic acids are NOT DETECTED.  The SARS-CoV-2 RNA is generally detectable in upper and lower respiratory specimens during the acute phase of infection. Negative results do not preclude SARS-CoV-2 infection, do not rule out co-infections with other pathogens, and should not be used as the sole basis for treatment or other patient management decisions. Negative results must be combined with clinical observations, patient history, and epidemiological information.  The expected result is Negative.  Fact Sheet for Patients: SugarRoll.be  Fact Sheet for Healthcare Providers: https://www.woods-mathews.com/  This test is not yet approved or cleared by the [place] FDA and  has been authorized for detection and/or diagnosis of SARS-CoV-2 by FDA under an Emergency Use Authorization (EUA). This EUA will remain  in effect (meaning this test can be used) for the duration of the COVID-19 declaration under Se ction 564(b)(1) of the Act, 21 U.S.C. section 360bbb-3(b)(1), unless the authorization is terminated or revoked sooner.  Performed at [facility], [place] [address] [place], [place] [address]      Labs: BNP (last 3 results) Recent Labs    09/19/21 2034  BNP 1,577.0*    Basic Metabolic Panel: Recent Labs  Lab 09/29/21 0515 09/30/21 2595  10/01/21 0447 10/03/21 0435  NA 144 146* 143 142  K 3.4* 3.5 3.2* 4.0  CL 106 107 110 108  CO2 29 30 29 24   GLUCOSE 186* 147* 142* 145*  BUN 52* 56* 53* 54*  CREATININE 2.17* 2.27* 2.26* 2.44*  CALCIUM 8.1* 8.0* 7.8* 8.3*    Liver Function Tests: Recent Labs  Lab 10/03/21 0435  AST 22  ALT 79*  ALKPHOS 131*  BILITOT 0.9  PROT 5.9*  ALBUMIN 3.6    No results for input(s): LIPASE, AMYLASE in the last 168 hours. No results for input(s): AMMONIA in the last 168 hours. CBC: Recent Labs  Lab 09/29/21 0515 10/03/21 0506  WBC 4.8 6.7  HGB 11.9* 12.3  HCT 40.0 41.1  MCV 96.4 97.4  PLT 89* 73*    Cardiac Enzymes: No results for input(s): CKTOTAL, CKMB, CKMBINDEX, TROPONINI in the last 168 hours. BNP: Invalid input(s): POCBNP CBG: Recent Labs  Lab 10/03/21 0408 10/03/21 0809 10/03/21 1204 10/03/21 1604 10/04/21 0728  GLUCAP 123* 182* 139* 172* 183*    D-Dimer No results for input(s): DDIMER in the last 72 hours. Hgb A1c No results for input(s): HGBA1C in the last 72 hours. Lipid Profile No results for input(s): CHOL, HDL,  LDLCALC, TRIG, CHOLHDL, LDLDIRECT in the last 72 hours. Thyroid function studies No results for input(s): TSH, T4TOTAL, T3FREE, THYROIDAB in the last 72 hours.  Invalid input(s): FREET3 Anemia work up No results for input(s): VITAMINB12, FOLATE, FERRITIN, TIBC, IRON, RETICCTPCT in the last 72 hours. Urinalysis    Component Value Date/Time   COLORURINE STRAW (A) 09/19/2021 0037   APPEARANCEUR CLEAR 09/19/2021 0037   LABSPEC 1.004 (L) 09/19/2021 0037   PHURINE 5.0 09/19/2021 0037   GLUCOSEU NEGATIVE 09/19/2021 0037   HGBUR SMALL (A) 09/19/2021 0037   BILIRUBINUR NEGATIVE 09/19/2021 0037   BILIRUBINUR neg 07/16/2016 1154   KETONESUR NEGATIVE 09/19/2021 0037   PROTEINUR NEGATIVE 09/19/2021 0037   UROBILINOGEN 1.0 07/16/2016 1154   NITRITE NEGATIVE 09/19/2021 0037   LEUKOCYTESUR NEGATIVE 09/19/2021 0037   Sepsis Labs Invalid input(s): PROCALCITONIN,  WBC,  LACTICIDVEN Microbiology Recent Results (from the past 240 hour(s))  SARS CORONAVIRUS 2 (TAT 6-24 HRS) Nasopharyngeal Nasopharyngeal Swab     Status: None   Collection Time: 10/02/21 12:51 PM   Specimen: Nasopharyngeal Swab  Result Value Ref Range Status   SARS Coronavirus 2 NEGATIVE NEGATIVE Final    Comment: (NOTE) SARS-CoV-2 target nucleic acids are NOT DETECTED.  The SARS-CoV-2 RNA is generally detectable in upper and lower respiratory specimens during the acute phase of infection. Negative results do not preclude SARS-CoV-2 infection, do not rule out co-infections with other pathogens, and should not be used as the sole basis for treatment or other patient management decisions. Negative results must be combined with clinical observations, patient history, and epidemiological information. The expected result is Negative.  Fact Sheet for Patients: SugarRoll.be  Fact Sheet for Healthcare Providers: https://www.woods-mathews.com/  This test is not yet approved or cleared by the  [place] FDA and  has been authorized for detection and/or diagnosis of SARS-CoV-2 by FDA under an Emergency Use Authorization (EUA). This EUA will remain  in effect (meaning this test can be used) for the duration of the COVID-19 declaration under Se ction 564(b)(1) of the Act, 21 U.S.C. section 360bbb-3(b)(1), unless the authorization is terminated or revoked sooner.  Performed at [facility], [place] [address] [place], [place] [address]      Time coordinating discharge: Over 56  minutes  SIGNED:   Little Ishikawa, DO [facility] 10/04/2021, 8:04 AM Pager   If 7PM-7AM, please contact night-coverage [email or web address]

## 2021-10-04 NOTE — Plan of Care (Signed)
Discussed with patient plan of care for the evening, pain management and temperature in room with little teach back displayed.  Problem: Education: Goal: Knowledge of General Education information will improve Description: Including pain rating scale, medication(s)/side effects and non-pharmacologic comfort measures Outcome: Progressing   Problem: Health Behavior/Discharge Planning: Goal: Ability to manage health-related needs will improve Outcome: Not Progressing

## 2021-10-04 NOTE — TOC Transition Note (Signed)
Transition of Care [facility]) - CM/SW Discharge Note   Patient Details  Name: Kathryn Buckley MRN: [id number] Date of Birth: 1950-11-29  Transition of [facility]) CM/SW Contact:  Dessa Phi, RN Phone Number: 10/04/2021, 9:33 AM   Clinical Narrative: PTAR called going to rm#[id number],report tel#[phone],TKZS WFUXNA,[address] nurse.No further CM needs.      Final next level of care: Skilled Nursing Facility Barriers to Discharge: No Barriers Identified   Patient Goals and CMS Choice Patient states their goals for this hospitalization and ongoing recovery are:: go home CMS Medicare.gov Compare Post Acute Care list provided to:: Patient Represenative (must comment) Choice offered to / list presented to : Sibling  Discharge Placement PASRR number recieved: 10/01/21            Patient chooses bed at: [facility] Patient to be transferred to facility by: PTAR ([place]) Name of family member notified:  Altamese Dilling son) Patient and family notified of of transfer: 10/04/21  Discharge Plan and Services   Discharge Planning Services: CM Consult Post Acute Care Choice: Home Health                    HH Arranged: PT [place]: [place] Date [place]: 09/20/21 Time [place]: [id number] Representative spoke with at [place]: [place] ([place]) Interventions     Readmission Risk Interventions Readmission Risk Prevention Plan 09/26/2021  Transportation Screening Complete  PCP or Specialist Appt within 3-5 Days Complete  HRI or [place] Complete  Social Work Consult for [place] Planning/Counseling Complete  Palliative Care Screening Not Applicable  Medication Review Press photographer) Complete  Some recent data might be hidden

## 2021-10-10 DIAGNOSIS — R7401 Elevation of levels of liver transaminase levels: Secondary | ICD-10-CM | POA: Diagnosis not present

## 2021-10-10 DIAGNOSIS — D696 Thrombocytopenia, unspecified: Secondary | ICD-10-CM | POA: Diagnosis not present

## 2021-10-10 DIAGNOSIS — J9621 Acute and chronic respiratory failure with hypoxia: Secondary | ICD-10-CM | POA: Diagnosis not present

## 2021-10-10 DIAGNOSIS — I509 Heart failure, unspecified: Secondary | ICD-10-CM | POA: Diagnosis not present

## 2021-10-10 DIAGNOSIS — N183 Chronic kidney disease, stage 3 unspecified: Secondary | ICD-10-CM | POA: Diagnosis not present

## 2021-10-10 DIAGNOSIS — R739 Hyperglycemia, unspecified: Secondary | ICD-10-CM | POA: Diagnosis not present

## 2021-10-10 DIAGNOSIS — E039 Hypothyroidism, unspecified: Secondary | ICD-10-CM | POA: Diagnosis not present

## 2021-10-10 DIAGNOSIS — G9341 Metabolic encephalopathy: Secondary | ICD-10-CM | POA: Diagnosis not present

## 2021-10-10 DIAGNOSIS — J441 Chronic obstructive pulmonary disease with (acute) exacerbation: Secondary | ICD-10-CM | POA: Diagnosis not present

## 2021-10-10 DIAGNOSIS — N179 Acute kidney failure, unspecified: Secondary | ICD-10-CM | POA: Diagnosis not present

## 2021-10-13 DIAGNOSIS — F17210 Nicotine dependence, cigarettes, uncomplicated: Secondary | ICD-10-CM | POA: Diagnosis not present

## 2021-10-13 DIAGNOSIS — E876 Hypokalemia: Secondary | ICD-10-CM | POA: Diagnosis not present

## 2021-10-13 DIAGNOSIS — R7302 Impaired glucose tolerance (oral): Secondary | ICD-10-CM | POA: Diagnosis not present

## 2021-10-13 DIAGNOSIS — R2681 Unsteadiness on feet: Secondary | ICD-10-CM | POA: Diagnosis not present

## 2021-10-13 DIAGNOSIS — R739 Hyperglycemia, unspecified: Secondary | ICD-10-CM | POA: Diagnosis not present

## 2021-10-13 DIAGNOSIS — L97529 Non-pressure chronic ulcer of other part of left foot with unspecified severity: Secondary | ICD-10-CM | POA: Diagnosis not present

## 2021-10-13 DIAGNOSIS — R000 Tachycardia, unspecified: Secondary | ICD-10-CM | POA: Diagnosis not present

## 2021-10-13 DIAGNOSIS — I739 Peripheral vascular disease, unspecified: Secondary | ICD-10-CM | POA: Diagnosis not present

## 2021-10-13 DIAGNOSIS — I517 Cardiomegaly: Secondary | ICD-10-CM | POA: Diagnosis not present

## 2021-10-13 DIAGNOSIS — E1122 Type 2 diabetes mellitus with diabetic chronic kidney disease: Secondary | ICD-10-CM | POA: Diagnosis not present

## 2021-10-13 DIAGNOSIS — Z515 Encounter for palliative care: Secondary | ICD-10-CM | POA: Diagnosis not present

## 2021-10-13 DIAGNOSIS — Z950 Presence of cardiac pacemaker: Secondary | ICD-10-CM | POA: Diagnosis not present

## 2021-10-13 DIAGNOSIS — R54 Age-related physical debility: Secondary | ICD-10-CM | POA: Diagnosis not present

## 2021-10-13 DIAGNOSIS — R7401 Elevation of levels of liver transaminase levels: Secondary | ICD-10-CM | POA: Diagnosis not present

## 2021-10-13 DIAGNOSIS — N179 Acute kidney failure, unspecified: Secondary | ICD-10-CM | POA: Diagnosis not present

## 2021-10-13 DIAGNOSIS — E875 Hyperkalemia: Secondary | ICD-10-CM | POA: Diagnosis not present

## 2021-10-13 DIAGNOSIS — I5043 Acute on chronic combined systolic (congestive) and diastolic (congestive) heart failure: Secondary | ICD-10-CM | POA: Diagnosis not present

## 2021-10-13 DIAGNOSIS — I442 Atrioventricular block, complete: Secondary | ICD-10-CM | POA: Diagnosis not present

## 2021-10-13 DIAGNOSIS — R339 Retention of urine, unspecified: Secondary | ICD-10-CM | POA: Diagnosis not present

## 2021-10-13 DIAGNOSIS — R5381 Other malaise: Secondary | ICD-10-CM | POA: Diagnosis not present

## 2021-10-13 DIAGNOSIS — I130 Hypertensive heart and chronic kidney disease with heart failure and stage 1 through stage 4 chronic kidney disease, or unspecified chronic kidney disease: Secondary | ICD-10-CM | POA: Diagnosis not present

## 2021-10-13 DIAGNOSIS — I5020 Unspecified systolic (congestive) heart failure: Secondary | ICD-10-CM | POA: Diagnosis not present

## 2021-10-13 DIAGNOSIS — Z87891 Personal history of nicotine dependence: Secondary | ICD-10-CM | POA: Diagnosis not present

## 2021-10-13 DIAGNOSIS — J9621 Acute and chronic respiratory failure with hypoxia: Secondary | ICD-10-CM | POA: Diagnosis not present

## 2021-10-13 DIAGNOSIS — I120 Hypertensive chronic kidney disease with stage 5 chronic kidney disease or end stage renal disease: Secondary | ICD-10-CM | POA: Diagnosis not present

## 2021-10-13 DIAGNOSIS — I248 Other forms of acute ischemic heart disease: Secondary | ICD-10-CM | POA: Diagnosis not present

## 2021-10-13 DIAGNOSIS — R600 Localized edema: Secondary | ICD-10-CM | POA: Diagnosis not present

## 2021-10-13 DIAGNOSIS — E7220 Disorder of urea cycle metabolism, unspecified: Secondary | ICD-10-CM | POA: Diagnosis not present

## 2021-10-13 DIAGNOSIS — E1151 Type 2 diabetes mellitus with diabetic peripheral angiopathy without gangrene: Secondary | ICD-10-CM | POA: Diagnosis not present

## 2021-10-13 DIAGNOSIS — I509 Heart failure, unspecified: Secondary | ICD-10-CM | POA: Diagnosis not present

## 2021-10-13 DIAGNOSIS — E872 Acidosis, unspecified: Secondary | ICD-10-CM | POA: Diagnosis not present

## 2021-10-13 DIAGNOSIS — E11621 Type 2 diabetes mellitus with foot ulcer: Secondary | ICD-10-CM | POA: Diagnosis not present

## 2021-10-13 DIAGNOSIS — I459 Conduction disorder, unspecified: Secondary | ICD-10-CM | POA: Diagnosis not present

## 2021-10-13 DIAGNOSIS — J9622 Acute and chronic respiratory failure with hypercapnia: Secondary | ICD-10-CM | POA: Diagnosis not present

## 2021-10-13 DIAGNOSIS — L97522 Non-pressure chronic ulcer of other part of left foot with fat layer exposed: Secondary | ICD-10-CM | POA: Diagnosis not present

## 2021-10-13 DIAGNOSIS — Z9981 Dependence on supplemental oxygen: Secondary | ICD-10-CM | POA: Diagnosis not present

## 2021-10-13 DIAGNOSIS — Z743 Need for continuous supervision: Secondary | ICD-10-CM | POA: Diagnosis not present

## 2021-10-13 DIAGNOSIS — R609 Edema, unspecified: Secondary | ICD-10-CM | POA: Diagnosis not present

## 2021-10-13 DIAGNOSIS — I70202 Unspecified atherosclerosis of native arteries of extremities, left leg: Secondary | ICD-10-CM | POA: Diagnosis not present

## 2021-10-13 DIAGNOSIS — I10 Essential (primary) hypertension: Secondary | ICD-10-CM | POA: Diagnosis not present

## 2021-10-13 DIAGNOSIS — Z7982 Long term (current) use of aspirin: Secondary | ICD-10-CM | POA: Diagnosis not present

## 2021-10-13 DIAGNOSIS — I5023 Acute on chronic systolic (congestive) heart failure: Secondary | ICD-10-CM | POA: Diagnosis not present

## 2021-10-13 DIAGNOSIS — E039 Hypothyroidism, unspecified: Secondary | ICD-10-CM | POA: Diagnosis not present

## 2021-10-13 DIAGNOSIS — R278 Other lack of coordination: Secondary | ICD-10-CM | POA: Diagnosis not present

## 2021-10-13 DIAGNOSIS — T502X5A Adverse effect of carbonic-anhydrase inhibitors, benzothiadiazides and other diuretics, initial encounter: Secondary | ICD-10-CM | POA: Diagnosis not present

## 2021-10-13 DIAGNOSIS — L97521 Non-pressure chronic ulcer of other part of left foot limited to breakdown of skin: Secondary | ICD-10-CM | POA: Diagnosis not present

## 2021-10-13 DIAGNOSIS — L89623 Pressure ulcer of left heel, stage 3: Secondary | ICD-10-CM | POA: Diagnosis not present

## 2021-10-13 DIAGNOSIS — D61818 Other pancytopenia: Secondary | ICD-10-CM | POA: Diagnosis not present

## 2021-10-13 DIAGNOSIS — I7090 Unspecified atherosclerosis: Secondary | ICD-10-CM | POA: Diagnosis not present

## 2021-10-13 DIAGNOSIS — R7989 Other specified abnormal findings of blood chemistry: Secondary | ICD-10-CM | POA: Diagnosis not present

## 2021-10-13 DIAGNOSIS — J9611 Chronic respiratory failure with hypoxia: Secondary | ICD-10-CM | POA: Diagnosis not present

## 2021-10-13 DIAGNOSIS — J449 Chronic obstructive pulmonary disease, unspecified: Secondary | ICD-10-CM | POA: Diagnosis not present

## 2021-10-13 DIAGNOSIS — I110 Hypertensive heart disease with heart failure: Secondary | ICD-10-CM | POA: Diagnosis not present

## 2021-10-13 DIAGNOSIS — I70242 Atherosclerosis of native arteries of left leg with ulceration of calf: Secondary | ICD-10-CM | POA: Diagnosis not present

## 2021-10-13 DIAGNOSIS — E119 Type 2 diabetes mellitus without complications: Secondary | ICD-10-CM | POA: Diagnosis not present

## 2021-10-13 DIAGNOSIS — J811 Chronic pulmonary edema: Secondary | ICD-10-CM | POA: Diagnosis not present

## 2021-10-13 DIAGNOSIS — N183 Chronic kidney disease, stage 3 unspecified: Secondary | ICD-10-CM | POA: Diagnosis not present

## 2021-10-13 DIAGNOSIS — N189 Chronic kidney disease, unspecified: Secondary | ICD-10-CM | POA: Diagnosis not present

## 2021-10-13 DIAGNOSIS — E8809 Other disorders of plasma-protein metabolism, not elsewhere classified: Secondary | ICD-10-CM | POA: Diagnosis not present

## 2021-10-13 DIAGNOSIS — R062 Wheezing: Secondary | ICD-10-CM | POA: Diagnosis not present

## 2021-10-13 DIAGNOSIS — I771 Stricture of artery: Secondary | ICD-10-CM | POA: Diagnosis not present

## 2021-10-13 DIAGNOSIS — L97929 Non-pressure chronic ulcer of unspecified part of left lower leg with unspecified severity: Secondary | ICD-10-CM | POA: Diagnosis not present

## 2021-10-13 DIAGNOSIS — I5022 Chronic systolic (congestive) heart failure: Secondary | ICD-10-CM | POA: Diagnosis not present

## 2021-10-13 DIAGNOSIS — R160 Hepatomegaly, not elsewhere classified: Secondary | ICD-10-CM | POA: Diagnosis not present

## 2021-10-13 DIAGNOSIS — N1832 Chronic kidney disease, stage 3b: Secondary | ICD-10-CM | POA: Diagnosis not present

## 2021-10-13 DIAGNOSIS — Z20822 Contact with and (suspected) exposure to covid-19: Secondary | ICD-10-CM | POA: Diagnosis not present

## 2021-10-13 DIAGNOSIS — N185 Chronic kidney disease, stage 5: Secondary | ICD-10-CM | POA: Diagnosis not present

## 2021-10-13 DIAGNOSIS — Z7901 Long term (current) use of anticoagulants: Secondary | ICD-10-CM | POA: Diagnosis not present

## 2021-10-13 DIAGNOSIS — J9811 Atelectasis: Secondary | ICD-10-CM | POA: Diagnosis not present

## 2021-10-13 DIAGNOSIS — N170 Acute kidney failure with tubular necrosis: Secondary | ICD-10-CM | POA: Diagnosis not present

## 2021-10-13 DIAGNOSIS — I959 Hypotension, unspecified: Secondary | ICD-10-CM | POA: Diagnosis not present

## 2021-10-13 DIAGNOSIS — M6281 Muscle weakness (generalized): Secondary | ICD-10-CM | POA: Diagnosis not present

## 2021-10-13 DIAGNOSIS — D696 Thrombocytopenia, unspecified: Secondary | ICD-10-CM | POA: Diagnosis not present

## 2021-10-13 DIAGNOSIS — R0602 Shortness of breath: Secondary | ICD-10-CM | POA: Diagnosis not present

## 2021-10-13 DIAGNOSIS — I5021 Acute systolic (congestive) heart failure: Secondary | ICD-10-CM | POA: Diagnosis not present

## 2021-10-13 DIAGNOSIS — R627 Adult failure to thrive: Secondary | ICD-10-CM | POA: Diagnosis not present

## 2021-10-13 DIAGNOSIS — Z720 Tobacco use: Secondary | ICD-10-CM | POA: Diagnosis not present

## 2021-10-13 DIAGNOSIS — M6250 Muscle wasting and atrophy, not elsewhere classified, unspecified site: Secondary | ICD-10-CM | POA: Diagnosis not present

## 2021-10-14 DIAGNOSIS — I70242 Atherosclerosis of native arteries of left leg with ulceration of calf: Secondary | ICD-10-CM | POA: Diagnosis not present

## 2021-10-14 DIAGNOSIS — L97521 Non-pressure chronic ulcer of other part of left foot limited to breakdown of skin: Secondary | ICD-10-CM | POA: Diagnosis not present

## 2021-10-15 ENCOUNTER — Telehealth: Payer: Self-pay

## 2021-10-15 DIAGNOSIS — R160 Hepatomegaly, not elsewhere classified: Secondary | ICD-10-CM | POA: Diagnosis not present

## 2021-10-15 DIAGNOSIS — R7401 Elevation of levels of liver transaminase levels: Secondary | ICD-10-CM | POA: Diagnosis not present

## 2021-10-15 DIAGNOSIS — I7090 Unspecified atherosclerosis: Secondary | ICD-10-CM | POA: Diagnosis not present

## 2021-10-15 DIAGNOSIS — Z9981 Dependence on supplemental oxygen: Secondary | ICD-10-CM | POA: Diagnosis not present

## 2021-10-15 DIAGNOSIS — E039 Hypothyroidism, unspecified: Secondary | ICD-10-CM | POA: Diagnosis not present

## 2021-10-15 DIAGNOSIS — E119 Type 2 diabetes mellitus without complications: Secondary | ICD-10-CM | POA: Diagnosis not present

## 2021-10-15 DIAGNOSIS — J9611 Chronic respiratory failure with hypoxia: Secondary | ICD-10-CM | POA: Diagnosis not present

## 2021-10-15 DIAGNOSIS — Z950 Presence of cardiac pacemaker: Secondary | ICD-10-CM | POA: Diagnosis not present

## 2021-10-15 DIAGNOSIS — N183 Chronic kidney disease, stage 3 unspecified: Secondary | ICD-10-CM | POA: Diagnosis not present

## 2021-10-15 DIAGNOSIS — J449 Chronic obstructive pulmonary disease, unspecified: Secondary | ICD-10-CM | POA: Diagnosis not present

## 2021-10-15 DIAGNOSIS — I5023 Acute on chronic systolic (congestive) heart failure: Secondary | ICD-10-CM | POA: Diagnosis not present

## 2021-10-15 DIAGNOSIS — I248 Other forms of acute ischemic heart disease: Secondary | ICD-10-CM | POA: Diagnosis not present

## 2021-10-15 NOTE — Telephone Encounter (Signed)
Patient currently inpatient at [facility] Patient Name:Kathryn Buckley Initial Comment Caller states that the patient was in the hospital on the Wednesday before new years. She has been in an assisted living [facility]) since she got released. Her feet are swollen really bad and leaking fluid. She has kidney failure and COPD. He wants to let the doctor know that he doesn't think the facility is doing a very good job and he is needing to know what to do about it. Additional Comment He isn't sure if he needs to call EMS or take the patient to the ER. Translation No Nurse Assessment Nurse: Cherre Robins, RN, Ria Comment Date/Time (Eastern Time): 10/13/2021 5:46:44 PM Confirm and document reason for call. If symptomatic, describe symptoms. ---Caller states her sister lives in [facility] assisted living facility since being d/c'd from the hospital around [place] Years. States her feet are swollen and leaking fluid. No fever. Does the patient have any new or worsening symptoms? ---Yes Will a triage be completed? ---Yes Related visit to physician within the last 2 weeks? ---Yes Does the PT have any chronic conditions? (i.e. diabetes, asthma, this includes High risk factors for pregnancy, etc.) ---Yes List chronic conditions. ---kidney failure, COPD, diabetes Is this a behavioral health or substance abuse call? ---No Guidelines Guideline Title Affirmed Question Affirmed Notes Nurse Date/Time (Eastern Time) Leg Swelling and Edema Looks like a broken bone or dislocated Weiss-Hilton, RN, Ria Comment 10/13/2021 5:54:35 PM PLEASE NOTE: All timestamps contained within this report are represented as [place] Standard Time. CONFIDENTIALTY NOTICE: This fax transmission is intended only for the addressee. It contains information that is legally privileged, confidential or otherwise protected from use or  disclosure. If you are not the intended recipient, you are strictly prohibited from reviewing, disclosing, copying using or disseminating any of this information or taking any action in reliance on or regarding this information. If you have received this fax in error, please notify us immediately by telephone so that we can arrange for its return to [place]. Phone: [phone], Toll-Free: [phone], Fax: [phone] Page: 2 of 2 Call Id: [id number] Guidelines Guideline Title Affirmed Question Affirmed Notes Nurse Date/Time Eilene Ghazi Time) joint (e.g., crooked or deformed) Disp. Time Eilene Ghazi Time) Disposition Final User 10/13/2021 6:10:59 PM 911 Outcome Documentation Weiss-Hilton, RN, Ria Comment Reason: Pt stated she has been trying to dial 911 and has been unable to get through as she has issues with her phone sometimes. RN conferenced caller into the call again and and caller stated that she will call 911 for her sister (pt) and have them send help to her. 10/13/2021 6:00:12 PM Call EMS 911 Now Yes Weiss-Hilton, RN, Ivonne Andrew Disagree/Comply Comply Caller Understands Yes PreDisposition InappropriateToAsk Care Advice Given Per Guideline CALL EMS 911 NOW: * Immediate medical attention is needed. You need to hang up and call 911 (or an ambulance). CARE ADVICE given per Leg Swelling and Edema (Adult) guideline. Comments User: Carolan Clines, RN Date/Time Eilene Ghazi Time): 10/13/2021 5:55:42 PM Caller states she is on oxygen

## 2021-10-15 NOTE — Telephone Encounter (Signed)
Noted  

## 2021-10-16 ENCOUNTER — Other Ambulatory Visit: Payer: Self-pay

## 2021-10-16 ENCOUNTER — Telehealth (HOSPITAL_COMMUNITY): Payer: Medicare Other | Admitting: Psychiatry

## 2021-10-16 DIAGNOSIS — Z9981 Dependence on supplemental oxygen: Secondary | ICD-10-CM | POA: Diagnosis not present

## 2021-10-16 DIAGNOSIS — Z720 Tobacco use: Secondary | ICD-10-CM | POA: Diagnosis not present

## 2021-10-16 DIAGNOSIS — J9611 Chronic respiratory failure with hypoxia: Secondary | ICD-10-CM | POA: Diagnosis not present

## 2021-10-16 DIAGNOSIS — I248 Other forms of acute ischemic heart disease: Secondary | ICD-10-CM | POA: Diagnosis not present

## 2021-10-16 DIAGNOSIS — E119 Type 2 diabetes mellitus without complications: Secondary | ICD-10-CM | POA: Diagnosis not present

## 2021-10-16 DIAGNOSIS — N183 Chronic kidney disease, stage 3 unspecified: Secondary | ICD-10-CM | POA: Diagnosis not present

## 2021-10-16 DIAGNOSIS — Z950 Presence of cardiac pacemaker: Secondary | ICD-10-CM | POA: Diagnosis not present

## 2021-10-16 DIAGNOSIS — I459 Conduction disorder, unspecified: Secondary | ICD-10-CM | POA: Diagnosis not present

## 2021-10-16 DIAGNOSIS — E039 Hypothyroidism, unspecified: Secondary | ICD-10-CM | POA: Diagnosis not present

## 2021-10-16 DIAGNOSIS — J449 Chronic obstructive pulmonary disease, unspecified: Secondary | ICD-10-CM | POA: Diagnosis not present

## 2021-10-16 DIAGNOSIS — I5023 Acute on chronic systolic (congestive) heart failure: Secondary | ICD-10-CM | POA: Diagnosis not present

## 2021-10-16 DIAGNOSIS — R7401 Elevation of levels of liver transaminase levels: Secondary | ICD-10-CM | POA: Diagnosis not present

## 2021-10-17 DIAGNOSIS — E039 Hypothyroidism, unspecified: Secondary | ICD-10-CM | POA: Diagnosis not present

## 2021-10-17 DIAGNOSIS — I5023 Acute on chronic systolic (congestive) heart failure: Secondary | ICD-10-CM | POA: Diagnosis not present

## 2021-10-17 DIAGNOSIS — I7090 Unspecified atherosclerosis: Secondary | ICD-10-CM | POA: Diagnosis not present

## 2021-10-17 DIAGNOSIS — I248 Other forms of acute ischemic heart disease: Secondary | ICD-10-CM | POA: Diagnosis not present

## 2021-10-17 DIAGNOSIS — J9611 Chronic respiratory failure with hypoxia: Secondary | ICD-10-CM | POA: Diagnosis not present

## 2021-10-17 DIAGNOSIS — J449 Chronic obstructive pulmonary disease, unspecified: Secondary | ICD-10-CM | POA: Diagnosis not present

## 2021-10-17 DIAGNOSIS — R160 Hepatomegaly, not elsewhere classified: Secondary | ICD-10-CM | POA: Diagnosis not present

## 2021-10-17 DIAGNOSIS — I459 Conduction disorder, unspecified: Secondary | ICD-10-CM | POA: Diagnosis not present

## 2021-10-17 DIAGNOSIS — Z950 Presence of cardiac pacemaker: Secondary | ICD-10-CM | POA: Diagnosis not present

## 2021-10-17 DIAGNOSIS — E119 Type 2 diabetes mellitus without complications: Secondary | ICD-10-CM | POA: Diagnosis not present

## 2021-10-17 DIAGNOSIS — N183 Chronic kidney disease, stage 3 unspecified: Secondary | ICD-10-CM | POA: Diagnosis not present

## 2021-10-17 DIAGNOSIS — Z9981 Dependence on supplemental oxygen: Secondary | ICD-10-CM | POA: Diagnosis not present

## 2021-10-18 DIAGNOSIS — E039 Hypothyroidism, unspecified: Secondary | ICD-10-CM | POA: Diagnosis not present

## 2021-10-18 DIAGNOSIS — I5023 Acute on chronic systolic (congestive) heart failure: Secondary | ICD-10-CM | POA: Diagnosis not present

## 2021-10-18 DIAGNOSIS — E119 Type 2 diabetes mellitus without complications: Secondary | ICD-10-CM | POA: Diagnosis not present

## 2021-10-18 DIAGNOSIS — N179 Acute kidney failure, unspecified: Secondary | ICD-10-CM | POA: Diagnosis not present

## 2021-10-18 DIAGNOSIS — Z9981 Dependence on supplemental oxygen: Secondary | ICD-10-CM | POA: Diagnosis not present

## 2021-10-18 DIAGNOSIS — J449 Chronic obstructive pulmonary disease, unspecified: Secondary | ICD-10-CM | POA: Diagnosis not present

## 2021-10-18 DIAGNOSIS — N183 Chronic kidney disease, stage 3 unspecified: Secondary | ICD-10-CM | POA: Diagnosis not present

## 2021-10-18 DIAGNOSIS — R160 Hepatomegaly, not elsewhere classified: Secondary | ICD-10-CM | POA: Diagnosis not present

## 2021-10-18 DIAGNOSIS — Z950 Presence of cardiac pacemaker: Secondary | ICD-10-CM | POA: Diagnosis not present

## 2021-10-18 DIAGNOSIS — I248 Other forms of acute ischemic heart disease: Secondary | ICD-10-CM | POA: Diagnosis not present

## 2021-10-18 DIAGNOSIS — Z7982 Long term (current) use of aspirin: Secondary | ICD-10-CM | POA: Diagnosis not present

## 2021-10-18 DIAGNOSIS — J9611 Chronic respiratory failure with hypoxia: Secondary | ICD-10-CM | POA: Diagnosis not present

## 2021-10-19 DIAGNOSIS — E039 Hypothyroidism, unspecified: Secondary | ICD-10-CM | POA: Diagnosis not present

## 2021-10-19 DIAGNOSIS — Z950 Presence of cardiac pacemaker: Secondary | ICD-10-CM | POA: Diagnosis not present

## 2021-10-19 DIAGNOSIS — E119 Type 2 diabetes mellitus without complications: Secondary | ICD-10-CM | POA: Diagnosis not present

## 2021-10-19 DIAGNOSIS — N183 Chronic kidney disease, stage 3 unspecified: Secondary | ICD-10-CM | POA: Diagnosis not present

## 2021-10-19 DIAGNOSIS — Z7982 Long term (current) use of aspirin: Secondary | ICD-10-CM | POA: Diagnosis not present

## 2021-10-19 DIAGNOSIS — R7989 Other specified abnormal findings of blood chemistry: Secondary | ICD-10-CM | POA: Diagnosis not present

## 2021-10-19 DIAGNOSIS — J9611 Chronic respiratory failure with hypoxia: Secondary | ICD-10-CM | POA: Diagnosis not present

## 2021-10-19 DIAGNOSIS — J449 Chronic obstructive pulmonary disease, unspecified: Secondary | ICD-10-CM | POA: Diagnosis not present

## 2021-10-19 DIAGNOSIS — I5023 Acute on chronic systolic (congestive) heart failure: Secondary | ICD-10-CM | POA: Diagnosis not present

## 2021-10-19 DIAGNOSIS — N179 Acute kidney failure, unspecified: Secondary | ICD-10-CM | POA: Diagnosis not present

## 2021-10-19 DIAGNOSIS — R7401 Elevation of levels of liver transaminase levels: Secondary | ICD-10-CM | POA: Diagnosis not present

## 2021-10-19 DIAGNOSIS — Z9981 Dependence on supplemental oxygen: Secondary | ICD-10-CM | POA: Diagnosis not present

## 2021-10-20 DIAGNOSIS — N179 Acute kidney failure, unspecified: Secondary | ICD-10-CM | POA: Diagnosis not present

## 2021-10-20 DIAGNOSIS — Z950 Presence of cardiac pacemaker: Secondary | ICD-10-CM | POA: Diagnosis not present

## 2021-10-20 DIAGNOSIS — Z720 Tobacco use: Secondary | ICD-10-CM | POA: Diagnosis not present

## 2021-10-20 DIAGNOSIS — E039 Hypothyroidism, unspecified: Secondary | ICD-10-CM | POA: Diagnosis not present

## 2021-10-20 DIAGNOSIS — E119 Type 2 diabetes mellitus without complications: Secondary | ICD-10-CM | POA: Diagnosis not present

## 2021-10-20 DIAGNOSIS — R7401 Elevation of levels of liver transaminase levels: Secondary | ICD-10-CM | POA: Diagnosis not present

## 2021-10-20 DIAGNOSIS — J9611 Chronic respiratory failure with hypoxia: Secondary | ICD-10-CM | POA: Diagnosis not present

## 2021-10-20 DIAGNOSIS — I248 Other forms of acute ischemic heart disease: Secondary | ICD-10-CM | POA: Diagnosis not present

## 2021-10-20 DIAGNOSIS — J449 Chronic obstructive pulmonary disease, unspecified: Secondary | ICD-10-CM | POA: Diagnosis not present

## 2021-10-20 DIAGNOSIS — I5023 Acute on chronic systolic (congestive) heart failure: Secondary | ICD-10-CM | POA: Diagnosis not present

## 2021-10-20 DIAGNOSIS — Z9981 Dependence on supplemental oxygen: Secondary | ICD-10-CM | POA: Diagnosis not present

## 2021-10-20 DIAGNOSIS — N183 Chronic kidney disease, stage 3 unspecified: Secondary | ICD-10-CM | POA: Diagnosis not present

## 2021-10-29 DIAGNOSIS — D61818 Other pancytopenia: Secondary | ICD-10-CM | POA: Diagnosis not present

## 2021-10-29 DIAGNOSIS — R5381 Other malaise: Secondary | ICD-10-CM | POA: Diagnosis not present

## 2021-10-29 DIAGNOSIS — D631 Anemia in chronic kidney disease: Secondary | ICD-10-CM | POA: Diagnosis not present

## 2021-10-29 DIAGNOSIS — L97528 Non-pressure chronic ulcer of other part of left foot with other specified severity: Secondary | ICD-10-CM | POA: Diagnosis not present

## 2021-10-29 DIAGNOSIS — I739 Peripheral vascular disease, unspecified: Secondary | ICD-10-CM | POA: Diagnosis not present

## 2021-10-29 DIAGNOSIS — L97522 Non-pressure chronic ulcer of other part of left foot with fat layer exposed: Secondary | ICD-10-CM | POA: Diagnosis not present

## 2021-10-29 DIAGNOSIS — L89623 Pressure ulcer of left heel, stage 3: Secondary | ICD-10-CM | POA: Diagnosis not present

## 2021-10-29 DIAGNOSIS — N179 Acute kidney failure, unspecified: Secondary | ICD-10-CM | POA: Diagnosis not present

## 2021-10-29 DIAGNOSIS — J449 Chronic obstructive pulmonary disease, unspecified: Secondary | ICD-10-CM | POA: Diagnosis not present

## 2021-10-29 DIAGNOSIS — N185 Chronic kidney disease, stage 5: Secondary | ICD-10-CM | POA: Diagnosis not present

## 2021-10-29 DIAGNOSIS — J9611 Chronic respiratory failure with hypoxia: Secondary | ICD-10-CM | POA: Diagnosis not present

## 2021-10-29 DIAGNOSIS — Z950 Presence of cardiac pacemaker: Secondary | ICD-10-CM | POA: Diagnosis not present

## 2021-10-29 DIAGNOSIS — I83025 Varicose veins of left lower extremity with ulcer other part of foot: Secondary | ICD-10-CM | POA: Diagnosis not present

## 2021-10-29 DIAGNOSIS — N189 Chronic kidney disease, unspecified: Secondary | ICD-10-CM | POA: Diagnosis not present

## 2021-10-29 DIAGNOSIS — E1151 Type 2 diabetes mellitus with diabetic peripheral angiopathy without gangrene: Secondary | ICD-10-CM | POA: Diagnosis not present

## 2021-10-29 DIAGNOSIS — R739 Hyperglycemia, unspecified: Secondary | ICD-10-CM | POA: Diagnosis not present

## 2021-10-29 DIAGNOSIS — R7302 Impaired glucose tolerance (oral): Secondary | ICD-10-CM | POA: Diagnosis not present

## 2021-10-29 DIAGNOSIS — I70244 Atherosclerosis of native arteries of left leg with ulceration of heel and midfoot: Secondary | ICD-10-CM | POA: Diagnosis not present

## 2021-10-29 DIAGNOSIS — R110 Nausea: Secondary | ICD-10-CM | POA: Diagnosis not present

## 2021-10-29 DIAGNOSIS — S91302A Unspecified open wound, left foot, initial encounter: Secondary | ICD-10-CM | POA: Diagnosis not present

## 2021-10-29 DIAGNOSIS — N3000 Acute cystitis without hematuria: Secondary | ICD-10-CM | POA: Diagnosis not present

## 2021-10-29 DIAGNOSIS — I7090 Unspecified atherosclerosis: Secondary | ICD-10-CM | POA: Diagnosis not present

## 2021-10-29 DIAGNOSIS — F17210 Nicotine dependence, cigarettes, uncomplicated: Secondary | ICD-10-CM | POA: Diagnosis not present

## 2021-10-29 DIAGNOSIS — G4700 Insomnia, unspecified: Secondary | ICD-10-CM | POA: Diagnosis not present

## 2021-10-29 DIAGNOSIS — I130 Hypertensive heart and chronic kidney disease with heart failure and stage 1 through stage 4 chronic kidney disease, or unspecified chronic kidney disease: Secondary | ICD-10-CM | POA: Diagnosis not present

## 2021-10-29 DIAGNOSIS — L89152 Pressure ulcer of sacral region, stage 2: Secondary | ICD-10-CM | POA: Diagnosis not present

## 2021-10-29 DIAGNOSIS — I70202 Unspecified atherosclerosis of native arteries of extremities, left leg: Secondary | ICD-10-CM | POA: Diagnosis not present

## 2021-10-29 DIAGNOSIS — R0602 Shortness of breath: Secondary | ICD-10-CM | POA: Diagnosis not present

## 2021-10-29 DIAGNOSIS — M79672 Pain in left foot: Secondary | ICD-10-CM | POA: Diagnosis not present

## 2021-10-29 DIAGNOSIS — J439 Emphysema, unspecified: Secondary | ICD-10-CM | POA: Diagnosis not present

## 2021-10-29 DIAGNOSIS — I132 Hypertensive heart and chronic kidney disease with heart failure and with stage 5 chronic kidney disease, or end stage renal disease: Secondary | ICD-10-CM | POA: Diagnosis not present

## 2021-10-29 DIAGNOSIS — I5023 Acute on chronic systolic (congestive) heart failure: Secondary | ICD-10-CM | POA: Diagnosis not present

## 2021-10-29 DIAGNOSIS — L03116 Cellulitis of left lower limb: Secondary | ICD-10-CM | POA: Diagnosis not present

## 2021-10-29 DIAGNOSIS — R531 Weakness: Secondary | ICD-10-CM | POA: Diagnosis not present

## 2021-10-29 DIAGNOSIS — I959 Hypotension, unspecified: Secondary | ICD-10-CM | POA: Diagnosis not present

## 2021-10-29 DIAGNOSIS — I517 Cardiomegaly: Secondary | ICD-10-CM | POA: Diagnosis not present

## 2021-10-29 DIAGNOSIS — I120 Hypertensive chronic kidney disease with stage 5 chronic kidney disease or end stage renal disease: Secondary | ICD-10-CM | POA: Diagnosis not present

## 2021-10-29 DIAGNOSIS — R52 Pain, unspecified: Secondary | ICD-10-CM | POA: Diagnosis not present

## 2021-10-29 DIAGNOSIS — R609 Edema, unspecified: Secondary | ICD-10-CM | POA: Diagnosis not present

## 2021-10-29 DIAGNOSIS — I442 Atrioventricular block, complete: Secondary | ICD-10-CM | POA: Diagnosis not present

## 2021-10-29 DIAGNOSIS — I5043 Acute on chronic combined systolic (congestive) and diastolic (congestive) heart failure: Secondary | ICD-10-CM | POA: Diagnosis not present

## 2021-10-29 DIAGNOSIS — R0902 Hypoxemia: Secondary | ICD-10-CM | POA: Diagnosis not present

## 2021-10-29 DIAGNOSIS — R197 Diarrhea, unspecified: Secondary | ICD-10-CM | POA: Diagnosis not present

## 2021-10-29 DIAGNOSIS — E11621 Type 2 diabetes mellitus with foot ulcer: Secondary | ICD-10-CM | POA: Diagnosis not present

## 2021-10-29 DIAGNOSIS — I5021 Acute systolic (congestive) heart failure: Secondary | ICD-10-CM | POA: Diagnosis not present

## 2021-10-29 DIAGNOSIS — E039 Hypothyroidism, unspecified: Secondary | ICD-10-CM | POA: Diagnosis not present

## 2021-10-29 DIAGNOSIS — I872 Venous insufficiency (chronic) (peripheral): Secondary | ICD-10-CM | POA: Diagnosis not present

## 2021-10-29 DIAGNOSIS — Z7982 Long term (current) use of aspirin: Secondary | ICD-10-CM | POA: Diagnosis not present

## 2021-10-29 DIAGNOSIS — E1122 Type 2 diabetes mellitus with diabetic chronic kidney disease: Secondary | ICD-10-CM | POA: Diagnosis not present

## 2021-10-29 DIAGNOSIS — R627 Adult failure to thrive: Secondary | ICD-10-CM | POA: Diagnosis not present

## 2021-10-29 DIAGNOSIS — R300 Dysuria: Secondary | ICD-10-CM | POA: Diagnosis not present

## 2021-10-29 DIAGNOSIS — Z743 Need for continuous supervision: Secondary | ICD-10-CM | POA: Diagnosis not present

## 2021-10-29 DIAGNOSIS — E8809 Other disorders of plasma-protein metabolism, not elsewhere classified: Secondary | ICD-10-CM | POA: Diagnosis not present

## 2021-10-29 DIAGNOSIS — M6281 Muscle weakness (generalized): Secondary | ICD-10-CM | POA: Diagnosis not present

## 2021-10-29 DIAGNOSIS — E876 Hypokalemia: Secondary | ICD-10-CM | POA: Diagnosis not present

## 2021-10-29 DIAGNOSIS — R2681 Unsteadiness on feet: Secondary | ICD-10-CM | POA: Diagnosis not present

## 2021-10-29 DIAGNOSIS — R339 Retention of urine, unspecified: Secondary | ICD-10-CM | POA: Diagnosis not present

## 2021-10-29 DIAGNOSIS — N183 Chronic kidney disease, stage 3 unspecified: Secondary | ICD-10-CM | POA: Diagnosis not present

## 2021-10-29 DIAGNOSIS — L97929 Non-pressure chronic ulcer of unspecified part of left lower leg with unspecified severity: Secondary | ICD-10-CM | POA: Diagnosis not present

## 2021-10-29 DIAGNOSIS — R410 Disorientation, unspecified: Secondary | ICD-10-CM | POA: Diagnosis not present

## 2021-10-29 DIAGNOSIS — I10 Essential (primary) hypertension: Secondary | ICD-10-CM | POA: Diagnosis not present

## 2021-10-29 DIAGNOSIS — X58XXXA Exposure to other specified factors, initial encounter: Secondary | ICD-10-CM | POA: Diagnosis not present

## 2021-10-29 DIAGNOSIS — R278 Other lack of coordination: Secondary | ICD-10-CM | POA: Diagnosis not present

## 2021-10-29 DIAGNOSIS — M810 Age-related osteoporosis without current pathological fracture: Secondary | ICD-10-CM | POA: Diagnosis not present

## 2021-10-29 DIAGNOSIS — I340 Nonrheumatic mitral (valve) insufficiency: Secondary | ICD-10-CM | POA: Diagnosis not present

## 2021-10-29 DIAGNOSIS — M6250 Muscle wasting and atrophy, not elsewhere classified, unspecified site: Secondary | ICD-10-CM | POA: Diagnosis not present

## 2021-10-29 DIAGNOSIS — Z9981 Dependence on supplemental oxygen: Secondary | ICD-10-CM | POA: Diagnosis not present

## 2021-10-30 DIAGNOSIS — L97522 Non-pressure chronic ulcer of other part of left foot with fat layer exposed: Secondary | ICD-10-CM | POA: Diagnosis not present

## 2021-10-30 DIAGNOSIS — I739 Peripheral vascular disease, unspecified: Secondary | ICD-10-CM | POA: Diagnosis not present

## 2021-10-30 DIAGNOSIS — I5023 Acute on chronic systolic (congestive) heart failure: Secondary | ICD-10-CM | POA: Diagnosis not present

## 2021-10-30 DIAGNOSIS — I70244 Atherosclerosis of native arteries of left leg with ulceration of heel and midfoot: Secondary | ICD-10-CM | POA: Diagnosis not present

## 2021-10-30 DIAGNOSIS — J9611 Chronic respiratory failure with hypoxia: Secondary | ICD-10-CM | POA: Diagnosis not present

## 2021-10-30 DIAGNOSIS — N185 Chronic kidney disease, stage 5: Secondary | ICD-10-CM | POA: Diagnosis not present

## 2021-10-30 DIAGNOSIS — J449 Chronic obstructive pulmonary disease, unspecified: Secondary | ICD-10-CM | POA: Diagnosis not present

## 2021-11-01 DIAGNOSIS — G4700 Insomnia, unspecified: Secondary | ICD-10-CM | POA: Diagnosis not present

## 2021-11-01 DIAGNOSIS — J9611 Chronic respiratory failure with hypoxia: Secondary | ICD-10-CM | POA: Diagnosis not present

## 2021-11-02 DIAGNOSIS — R52 Pain, unspecified: Secondary | ICD-10-CM | POA: Diagnosis not present

## 2021-11-02 DIAGNOSIS — R339 Retention of urine, unspecified: Secondary | ICD-10-CM | POA: Diagnosis not present

## 2021-11-02 DIAGNOSIS — N185 Chronic kidney disease, stage 5: Secondary | ICD-10-CM | POA: Diagnosis not present

## 2021-11-05 DIAGNOSIS — L97522 Non-pressure chronic ulcer of other part of left foot with fat layer exposed: Secondary | ICD-10-CM | POA: Diagnosis not present

## 2021-11-05 DIAGNOSIS — I739 Peripheral vascular disease, unspecified: Secondary | ICD-10-CM | POA: Diagnosis not present

## 2021-11-05 DIAGNOSIS — J449 Chronic obstructive pulmonary disease, unspecified: Secondary | ICD-10-CM | POA: Diagnosis not present

## 2021-11-05 DIAGNOSIS — R339 Retention of urine, unspecified: Secondary | ICD-10-CM | POA: Diagnosis not present

## 2021-11-05 DIAGNOSIS — G4700 Insomnia, unspecified: Secondary | ICD-10-CM | POA: Diagnosis not present

## 2021-11-05 DIAGNOSIS — I5023 Acute on chronic systolic (congestive) heart failure: Secondary | ICD-10-CM | POA: Diagnosis not present

## 2021-11-05 DIAGNOSIS — J9611 Chronic respiratory failure with hypoxia: Secondary | ICD-10-CM | POA: Diagnosis not present

## 2021-11-06 DIAGNOSIS — R339 Retention of urine, unspecified: Secondary | ICD-10-CM | POA: Diagnosis not present

## 2021-11-06 DIAGNOSIS — I70244 Atherosclerosis of native arteries of left leg with ulceration of heel and midfoot: Secondary | ICD-10-CM | POA: Diagnosis not present

## 2021-11-06 DIAGNOSIS — R52 Pain, unspecified: Secondary | ICD-10-CM | POA: Diagnosis not present

## 2021-11-07 DIAGNOSIS — J439 Emphysema, unspecified: Secondary | ICD-10-CM | POA: Diagnosis not present

## 2021-11-07 DIAGNOSIS — R197 Diarrhea, unspecified: Secondary | ICD-10-CM | POA: Diagnosis not present

## 2021-11-07 DIAGNOSIS — R110 Nausea: Secondary | ICD-10-CM | POA: Diagnosis not present

## 2021-11-09 ENCOUNTER — Telehealth: Payer: Self-pay | Admitting: Family

## 2021-11-09 NOTE — Telephone Encounter (Signed)
Ronalee Belts from [facility] called to know if Kathryn Buckley would take over [name] orders for the patient following her discharge from rehab. He can be reached at [phone] and a voicemail is ok. Please advice.

## 2021-11-10 DIAGNOSIS — I83025 Varicose veins of left lower extremity with ulcer other part of foot: Secondary | ICD-10-CM | POA: Diagnosis not present

## 2021-11-10 DIAGNOSIS — R0902 Hypoxemia: Secondary | ICD-10-CM | POA: Diagnosis not present

## 2021-11-10 DIAGNOSIS — J9620 Acute and chronic respiratory failure, unspecified whether with hypoxia or hypercapnia: Secondary | ICD-10-CM | POA: Diagnosis not present

## 2021-11-10 DIAGNOSIS — I959 Hypotension, unspecified: Secondary | ICD-10-CM | POA: Diagnosis not present

## 2021-11-10 DIAGNOSIS — R627 Adult failure to thrive: Secondary | ICD-10-CM | POA: Diagnosis not present

## 2021-11-10 DIAGNOSIS — L97522 Non-pressure chronic ulcer of other part of left foot with fat layer exposed: Secondary | ICD-10-CM | POA: Diagnosis not present

## 2021-11-10 DIAGNOSIS — R410 Disorientation, unspecified: Secondary | ICD-10-CM | POA: Diagnosis not present

## 2021-11-10 DIAGNOSIS — I442 Atrioventricular block, complete: Secondary | ICD-10-CM | POA: Diagnosis not present

## 2021-11-10 DIAGNOSIS — F17210 Nicotine dependence, cigarettes, uncomplicated: Secondary | ICD-10-CM | POA: Diagnosis not present

## 2021-11-10 DIAGNOSIS — I70202 Unspecified atherosclerosis of native arteries of extremities, left leg: Secondary | ICD-10-CM | POA: Diagnosis not present

## 2021-11-10 DIAGNOSIS — D631 Anemia in chronic kidney disease: Secondary | ICD-10-CM | POA: Diagnosis not present

## 2021-11-10 DIAGNOSIS — L089 Local infection of the skin and subcutaneous tissue, unspecified: Secondary | ICD-10-CM | POA: Diagnosis not present

## 2021-11-10 DIAGNOSIS — F17200 Nicotine dependence, unspecified, uncomplicated: Secondary | ICD-10-CM | POA: Diagnosis not present

## 2021-11-10 DIAGNOSIS — L03116 Cellulitis of left lower limb: Secondary | ICD-10-CM | POA: Diagnosis not present

## 2021-11-10 DIAGNOSIS — L97528 Non-pressure chronic ulcer of other part of left foot with other specified severity: Secondary | ICD-10-CM | POA: Diagnosis not present

## 2021-11-10 DIAGNOSIS — X58XXXA Exposure to other specified factors, initial encounter: Secondary | ICD-10-CM | POA: Diagnosis not present

## 2021-11-10 DIAGNOSIS — R0602 Shortness of breath: Secondary | ICD-10-CM | POA: Diagnosis not present

## 2021-11-10 DIAGNOSIS — I872 Venous insufficiency (chronic) (peripheral): Secondary | ICD-10-CM | POA: Diagnosis not present

## 2021-11-10 DIAGNOSIS — R0689 Other abnormalities of breathing: Secondary | ICD-10-CM | POA: Diagnosis not present

## 2021-11-10 DIAGNOSIS — I517 Cardiomegaly: Secondary | ICD-10-CM | POA: Diagnosis not present

## 2021-11-10 DIAGNOSIS — R609 Edema, unspecified: Secondary | ICD-10-CM | POA: Diagnosis not present

## 2021-11-10 DIAGNOSIS — J9611 Chronic respiratory failure with hypoxia: Secondary | ICD-10-CM | POA: Diagnosis not present

## 2021-11-10 DIAGNOSIS — N184 Chronic kidney disease, stage 4 (severe): Secondary | ICD-10-CM | POA: Diagnosis not present

## 2021-11-10 DIAGNOSIS — E872 Acidosis, unspecified: Secondary | ICD-10-CM | POA: Diagnosis not present

## 2021-11-10 DIAGNOSIS — L89152 Pressure ulcer of sacral region, stage 2: Secondary | ICD-10-CM | POA: Diagnosis not present

## 2021-11-10 DIAGNOSIS — J439 Emphysema, unspecified: Secondary | ICD-10-CM | POA: Diagnosis not present

## 2021-11-10 DIAGNOSIS — L98499 Non-pressure chronic ulcer of skin of other sites with unspecified severity: Secondary | ICD-10-CM | POA: Diagnosis not present

## 2021-11-10 DIAGNOSIS — M810 Age-related osteoporosis without current pathological fracture: Secondary | ICD-10-CM | POA: Diagnosis not present

## 2021-11-10 DIAGNOSIS — E039 Hypothyroidism, unspecified: Secondary | ICD-10-CM | POA: Diagnosis not present

## 2021-11-10 DIAGNOSIS — N179 Acute kidney failure, unspecified: Secondary | ICD-10-CM | POA: Diagnosis not present

## 2021-11-10 DIAGNOSIS — E11621 Type 2 diabetes mellitus with foot ulcer: Secondary | ICD-10-CM | POA: Diagnosis not present

## 2021-11-10 DIAGNOSIS — M79672 Pain in left foot: Secondary | ICD-10-CM | POA: Diagnosis not present

## 2021-11-10 DIAGNOSIS — Z743 Need for continuous supervision: Secondary | ICD-10-CM | POA: Diagnosis not present

## 2021-11-10 DIAGNOSIS — T148XXA Other injury of unspecified body region, initial encounter: Secondary | ICD-10-CM | POA: Diagnosis not present

## 2021-11-10 DIAGNOSIS — N3000 Acute cystitis without hematuria: Secondary | ICD-10-CM | POA: Diagnosis not present

## 2021-11-10 DIAGNOSIS — R531 Weakness: Secondary | ICD-10-CM | POA: Diagnosis not present

## 2021-11-10 DIAGNOSIS — I130 Hypertensive heart and chronic kidney disease with heart failure and stage 1 through stage 4 chronic kidney disease, or unspecified chronic kidney disease: Secondary | ICD-10-CM | POA: Diagnosis not present

## 2021-11-10 DIAGNOSIS — N189 Chronic kidney disease, unspecified: Secondary | ICD-10-CM | POA: Diagnosis not present

## 2021-11-10 DIAGNOSIS — I340 Nonrheumatic mitral (valve) insufficiency: Secondary | ICD-10-CM | POA: Diagnosis not present

## 2021-11-10 DIAGNOSIS — R739 Hyperglycemia, unspecified: Secondary | ICD-10-CM | POA: Diagnosis not present

## 2021-11-10 DIAGNOSIS — E876 Hypokalemia: Secondary | ICD-10-CM | POA: Diagnosis not present

## 2021-11-10 DIAGNOSIS — L97529 Non-pressure chronic ulcer of other part of left foot with unspecified severity: Secondary | ICD-10-CM | POA: Diagnosis not present

## 2021-11-10 DIAGNOSIS — R300 Dysuria: Secondary | ICD-10-CM | POA: Diagnosis not present

## 2021-11-10 DIAGNOSIS — I5021 Acute systolic (congestive) heart failure: Secondary | ICD-10-CM | POA: Diagnosis not present

## 2021-11-10 DIAGNOSIS — J449 Chronic obstructive pulmonary disease, unspecified: Secondary | ICD-10-CM | POA: Diagnosis not present

## 2021-11-10 DIAGNOSIS — R5381 Other malaise: Secondary | ICD-10-CM | POA: Diagnosis not present

## 2021-11-10 DIAGNOSIS — Z7901 Long term (current) use of anticoagulants: Secondary | ICD-10-CM | POA: Diagnosis not present

## 2021-11-10 DIAGNOSIS — I5023 Acute on chronic systolic (congestive) heart failure: Secondary | ICD-10-CM | POA: Diagnosis not present

## 2021-11-10 DIAGNOSIS — H449 Unspecified disorder of globe: Secondary | ICD-10-CM | POA: Diagnosis not present

## 2021-11-10 DIAGNOSIS — Z9981 Dependence on supplemental oxygen: Secondary | ICD-10-CM | POA: Diagnosis not present

## 2021-11-10 DIAGNOSIS — E1122 Type 2 diabetes mellitus with diabetic chronic kidney disease: Secondary | ICD-10-CM | POA: Diagnosis not present

## 2021-11-10 DIAGNOSIS — I739 Peripheral vascular disease, unspecified: Secondary | ICD-10-CM | POA: Diagnosis not present

## 2021-11-10 DIAGNOSIS — I132 Hypertensive heart and chronic kidney disease with heart failure and with stage 5 chronic kidney disease, or end stage renal disease: Secondary | ICD-10-CM | POA: Diagnosis not present

## 2021-11-10 DIAGNOSIS — S91302A Unspecified open wound, left foot, initial encounter: Secondary | ICD-10-CM | POA: Diagnosis not present

## 2021-11-10 DIAGNOSIS — N185 Chronic kidney disease, stage 5: Secondary | ICD-10-CM | POA: Diagnosis not present

## 2021-11-10 DIAGNOSIS — E1151 Type 2 diabetes mellitus with diabetic peripheral angiopathy without gangrene: Secondary | ICD-10-CM | POA: Diagnosis not present

## 2021-11-12 NOTE — Telephone Encounter (Signed)
Yes

## 2021-11-12 NOTE — Telephone Encounter (Signed)
Please advise 

## 2021-11-12 NOTE — Telephone Encounter (Signed)
Mike is aware

## 2021-11-15 DIAGNOSIS — D696 Thrombocytopenia, unspecified: Secondary | ICD-10-CM | POA: Diagnosis not present

## 2021-11-15 DIAGNOSIS — S91302A Unspecified open wound, left foot, initial encounter: Secondary | ICD-10-CM | POA: Diagnosis not present

## 2021-11-15 DIAGNOSIS — D631 Anemia in chronic kidney disease: Secondary | ICD-10-CM | POA: Diagnosis not present

## 2021-11-15 DIAGNOSIS — G9341 Metabolic encephalopathy: Secondary | ICD-10-CM | POA: Diagnosis not present

## 2021-11-15 DIAGNOSIS — Z515 Encounter for palliative care: Secondary | ICD-10-CM | POA: Diagnosis not present

## 2021-11-15 DIAGNOSIS — I5021 Acute systolic (congestive) heart failure: Secondary | ICD-10-CM | POA: Diagnosis not present

## 2021-11-15 DIAGNOSIS — Z950 Presence of cardiac pacemaker: Secondary | ICD-10-CM | POA: Diagnosis not present

## 2021-11-15 DIAGNOSIS — I10 Essential (primary) hypertension: Secondary | ICD-10-CM | POA: Diagnosis not present

## 2021-11-15 DIAGNOSIS — R778 Other specified abnormalities of plasma proteins: Secondary | ICD-10-CM | POA: Diagnosis not present

## 2021-11-15 DIAGNOSIS — J9811 Atelectasis: Secondary | ICD-10-CM | POA: Diagnosis not present

## 2021-11-15 DIAGNOSIS — R601 Generalized edema: Secondary | ICD-10-CM | POA: Diagnosis not present

## 2021-11-15 DIAGNOSIS — I130 Hypertensive heart and chronic kidney disease with heart failure and stage 1 through stage 4 chronic kidney disease, or unspecified chronic kidney disease: Secondary | ICD-10-CM | POA: Diagnosis not present

## 2021-11-15 DIAGNOSIS — Z66 Do not resuscitate: Secondary | ICD-10-CM | POA: Diagnosis not present

## 2021-11-15 DIAGNOSIS — I739 Peripheral vascular disease, unspecified: Secondary | ICD-10-CM | POA: Diagnosis not present

## 2021-11-15 DIAGNOSIS — N179 Acute kidney failure, unspecified: Secondary | ICD-10-CM | POA: Diagnosis not present

## 2021-11-15 DIAGNOSIS — N390 Urinary tract infection, site not specified: Secondary | ICD-10-CM | POA: Diagnosis not present

## 2021-11-15 DIAGNOSIS — I340 Nonrheumatic mitral (valve) insufficiency: Secondary | ICD-10-CM | POA: Diagnosis not present

## 2021-11-15 DIAGNOSIS — Z885 Allergy status to narcotic agent status: Secondary | ICD-10-CM | POA: Diagnosis not present

## 2021-11-15 DIAGNOSIS — I21A1 Myocardial infarction type 2: Secondary | ICD-10-CM | POA: Diagnosis not present

## 2021-11-15 DIAGNOSIS — R531 Weakness: Secondary | ICD-10-CM | POA: Diagnosis not present

## 2021-11-15 DIAGNOSIS — R0689 Other abnormalities of breathing: Secondary | ICD-10-CM | POA: Diagnosis not present

## 2021-11-15 DIAGNOSIS — J9610 Chronic respiratory failure, unspecified whether with hypoxia or hypercapnia: Secondary | ICD-10-CM | POA: Diagnosis not present

## 2021-11-15 DIAGNOSIS — Z9981 Dependence on supplemental oxygen: Secondary | ICD-10-CM | POA: Diagnosis not present

## 2021-11-15 DIAGNOSIS — T17200D Unspecified foreign body in pharynx causing asphyxiation, subsequent encounter: Secondary | ICD-10-CM | POA: Diagnosis not present

## 2021-11-15 DIAGNOSIS — R1310 Dysphagia, unspecified: Secondary | ICD-10-CM | POA: Diagnosis not present

## 2021-11-15 DIAGNOSIS — Z743 Need for continuous supervision: Secondary | ICD-10-CM | POA: Diagnosis not present

## 2021-11-15 DIAGNOSIS — E872 Acidosis, unspecified: Secondary | ICD-10-CM | POA: Diagnosis not present

## 2021-11-15 DIAGNOSIS — I5023 Acute on chronic systolic (congestive) heart failure: Secondary | ICD-10-CM | POA: Diagnosis not present

## 2021-11-15 DIAGNOSIS — J439 Emphysema, unspecified: Secondary | ICD-10-CM | POA: Diagnosis not present

## 2021-11-15 DIAGNOSIS — Z9114 Patient's other noncompliance with medication regimen: Secondary | ICD-10-CM | POA: Diagnosis not present

## 2021-11-15 DIAGNOSIS — F17200 Nicotine dependence, unspecified, uncomplicated: Secondary | ICD-10-CM | POA: Diagnosis not present

## 2021-11-15 DIAGNOSIS — B49 Unspecified mycosis: Secondary | ICD-10-CM | POA: Diagnosis not present

## 2021-11-15 DIAGNOSIS — J90 Pleural effusion, not elsewhere classified: Secondary | ICD-10-CM | POA: Diagnosis not present

## 2021-11-15 DIAGNOSIS — J449 Chronic obstructive pulmonary disease, unspecified: Secondary | ICD-10-CM | POA: Diagnosis not present

## 2021-11-15 DIAGNOSIS — E876 Hypokalemia: Secondary | ICD-10-CM | POA: Diagnosis not present

## 2021-11-15 DIAGNOSIS — R0602 Shortness of breath: Secondary | ICD-10-CM | POA: Diagnosis not present

## 2021-11-15 DIAGNOSIS — N1832 Chronic kidney disease, stage 3b: Secondary | ICD-10-CM | POA: Diagnosis not present

## 2021-11-15 DIAGNOSIS — Z7189 Other specified counseling: Secondary | ICD-10-CM | POA: Diagnosis not present

## 2021-11-15 DIAGNOSIS — E785 Hyperlipidemia, unspecified: Secondary | ICD-10-CM | POA: Diagnosis not present

## 2021-11-15 DIAGNOSIS — E039 Hypothyroidism, unspecified: Secondary | ICD-10-CM | POA: Diagnosis not present

## 2021-11-15 DIAGNOSIS — J9621 Acute and chronic respiratory failure with hypoxia: Secondary | ICD-10-CM | POA: Diagnosis not present

## 2021-11-15 DIAGNOSIS — J9600 Acute respiratory failure, unspecified whether with hypoxia or hypercapnia: Secondary | ICD-10-CM | POA: Diagnosis not present

## 2021-11-15 DIAGNOSIS — N170 Acute kidney failure with tubular necrosis: Secondary | ICD-10-CM | POA: Diagnosis not present

## 2021-11-15 DIAGNOSIS — J441 Chronic obstructive pulmonary disease with (acute) exacerbation: Secondary | ICD-10-CM | POA: Diagnosis not present

## 2021-11-15 DIAGNOSIS — N184 Chronic kidney disease, stage 4 (severe): Secondary | ICD-10-CM | POA: Diagnosis not present

## 2021-11-16 ENCOUNTER — Telehealth: Payer: Self-pay

## 2021-11-16 ENCOUNTER — Ambulatory Visit: Payer: Medicare Other | Admitting: Family

## 2021-11-16 ENCOUNTER — Telehealth: Payer: Self-pay | Admitting: Family

## 2021-11-16 NOTE — Telephone Encounter (Signed)
Transition Care Management Unsuccessful Follow-up Telephone Call  Date of discharge and from where:  11/15/2021-[facility]  Attempts:  1st Attempt  Reason for unsuccessful TCM follow-up call:  No answer/busy

## 2021-11-16 NOTE — Telephone Encounter (Signed)
Please contact pt to arrange hospital follow up visit.

## 2021-11-19 NOTE — Telephone Encounter (Signed)
Transition Care Management Unsuccessful Follow-up Telephone Call  Date of discharge and from where:  11/15/2021-[facility]  Attempts:  2nd Attempt  Reason for unsuccessful TCM follow-up call:  Left voice message

## 2021-12-05 DIAGNOSIS — J439 Emphysema, unspecified: Secondary | ICD-10-CM | POA: Diagnosis not present

## 2022-01-21 IMAGING — DX DG CHEST 1V PORT
1 series · 1 of 1 positions shown · non-contrast
Comparison: March 16, 2016.

CLINICAL DATA: Cough, shortness of breath.

EXAM:
PORTABLE CHEST 1 VIEW

[chest ap]
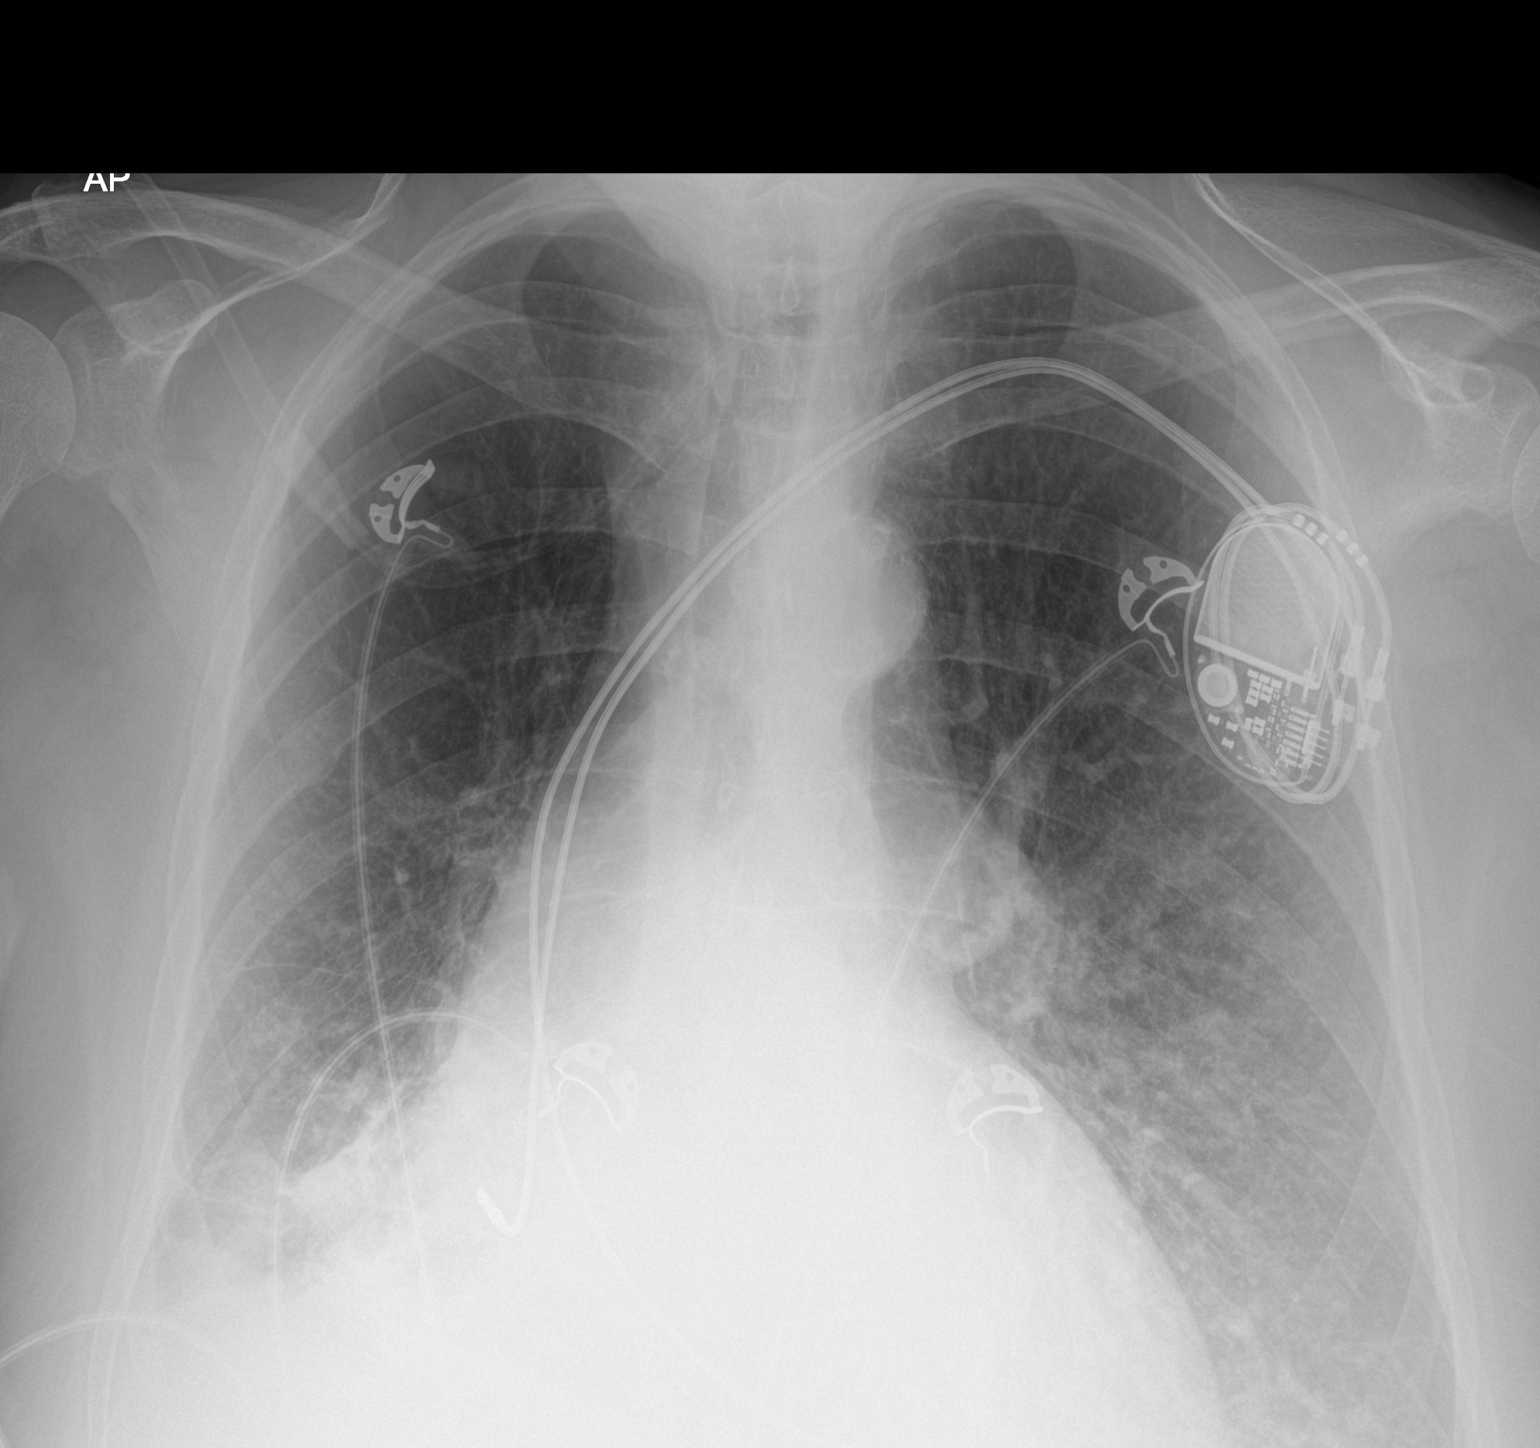

[1 of 1 positions shown; findings below may reference images not displayed]

FINDINGS: Dual lead pacer device in place.  EKG leads project over the chest.

Extreme lung bases excluded from view. Signs of bibasilar airspace
disease in graded opacities at the RIGHT and LEFT lung base. LEFT
hemidiaphragm not imaged.

No visible pneumothorax.

Visualized cardiomediastinal contours are grossly stable but
obscured at the RIGHT lung base due to dense basilar airspace
disease.

On limited assessment there is no acute skeletal process.
IMPRESSION: 1. Signs of bibasilar airspace disease and graded opacities at the
RIGHT and LEFT lung base. Dense basilar opacity on the RIGHT is
suspicious for pneumonia and is partially visualized. Airspace
disease at the bases likely associated with pleural fluid.
2. Most inferior aspect of the chest, lung bases excluded from view.
Consider repeat imaging of the chest to include the lung bases.

## 2022-01-21 IMAGING — CR DG CHEST 2V
2 series · 2 of 2 positions shown · non-contrast
Comparison: Earlier today

CLINICAL DATA: Cough

EXAM:
CHEST - 2 VIEW

[w chest pa]
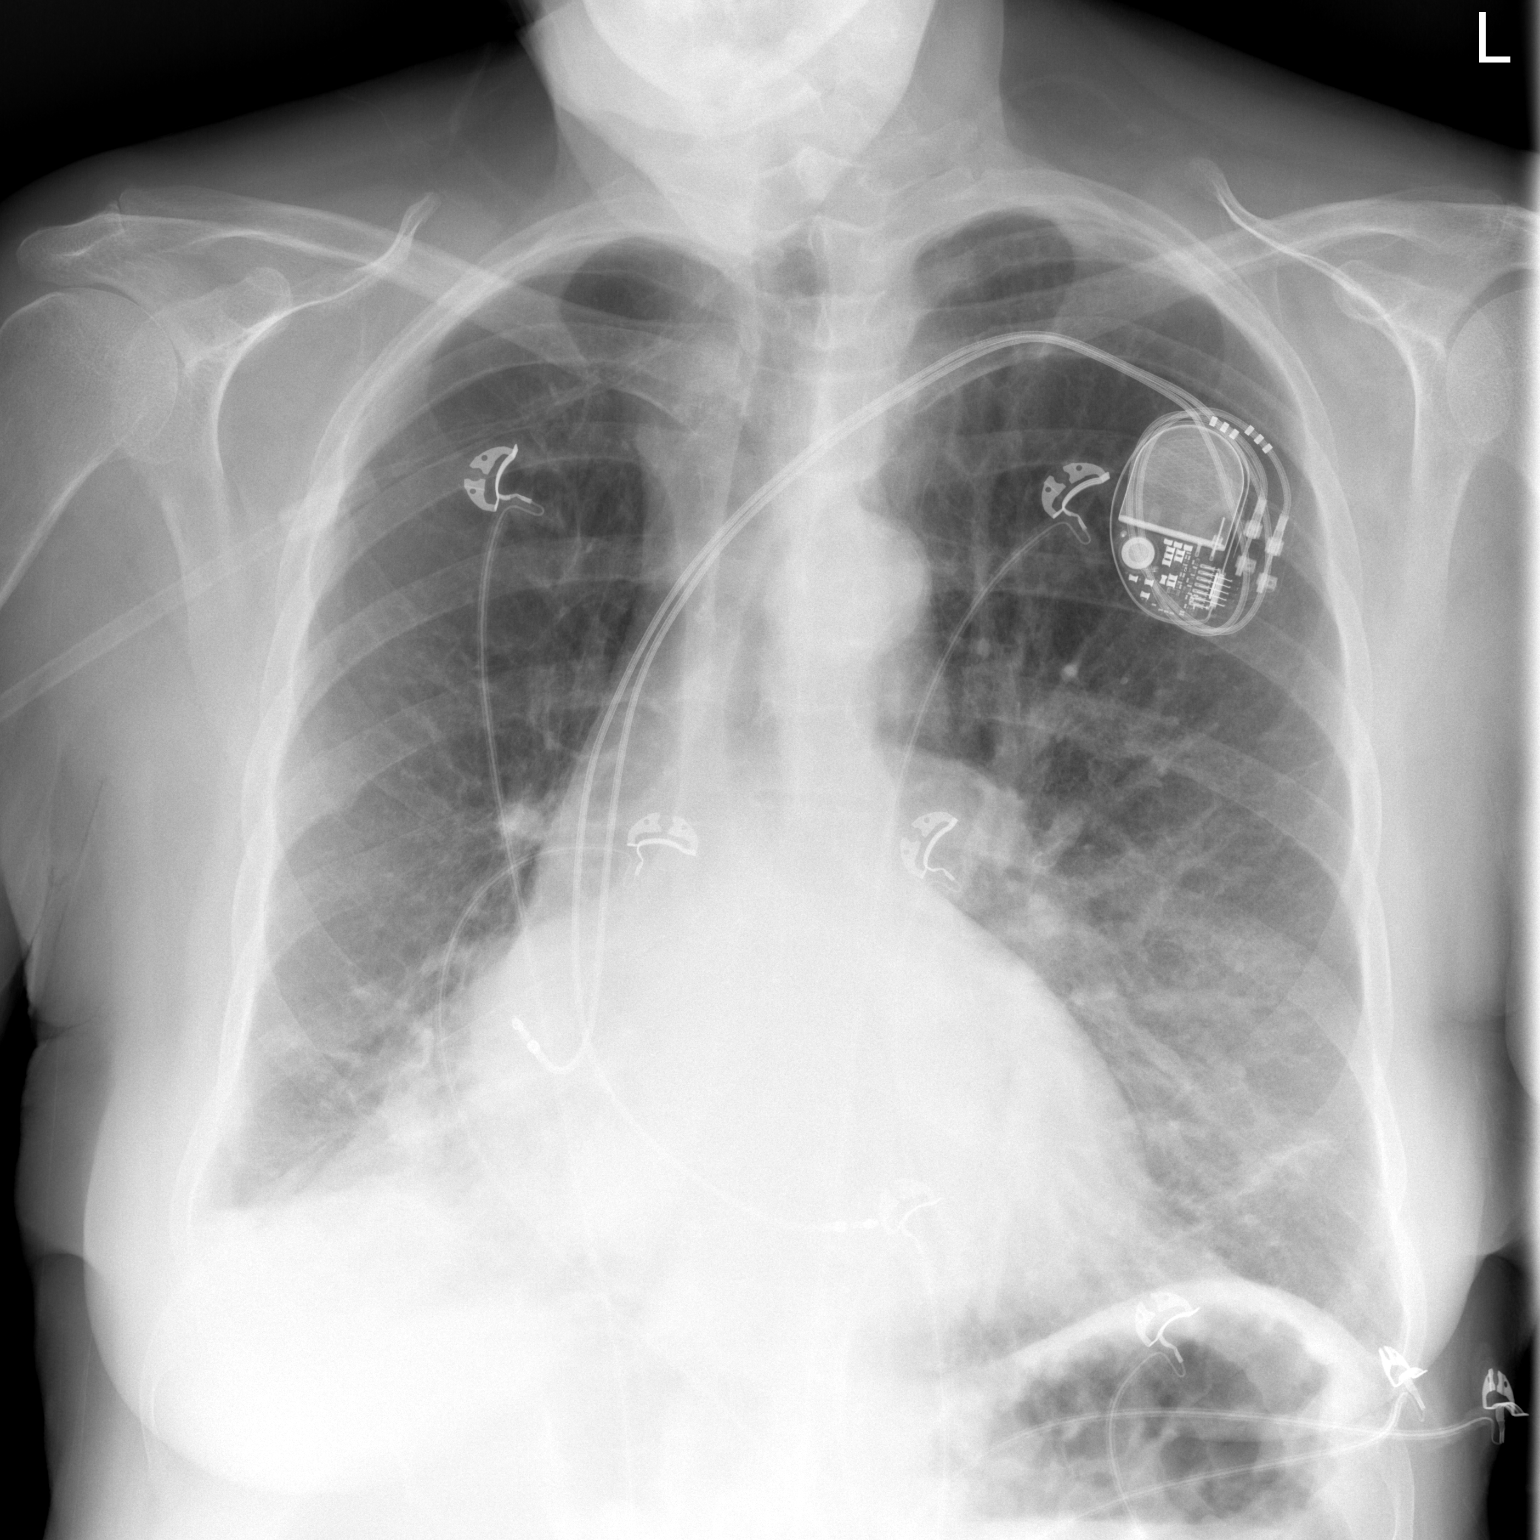

[w chest lat]
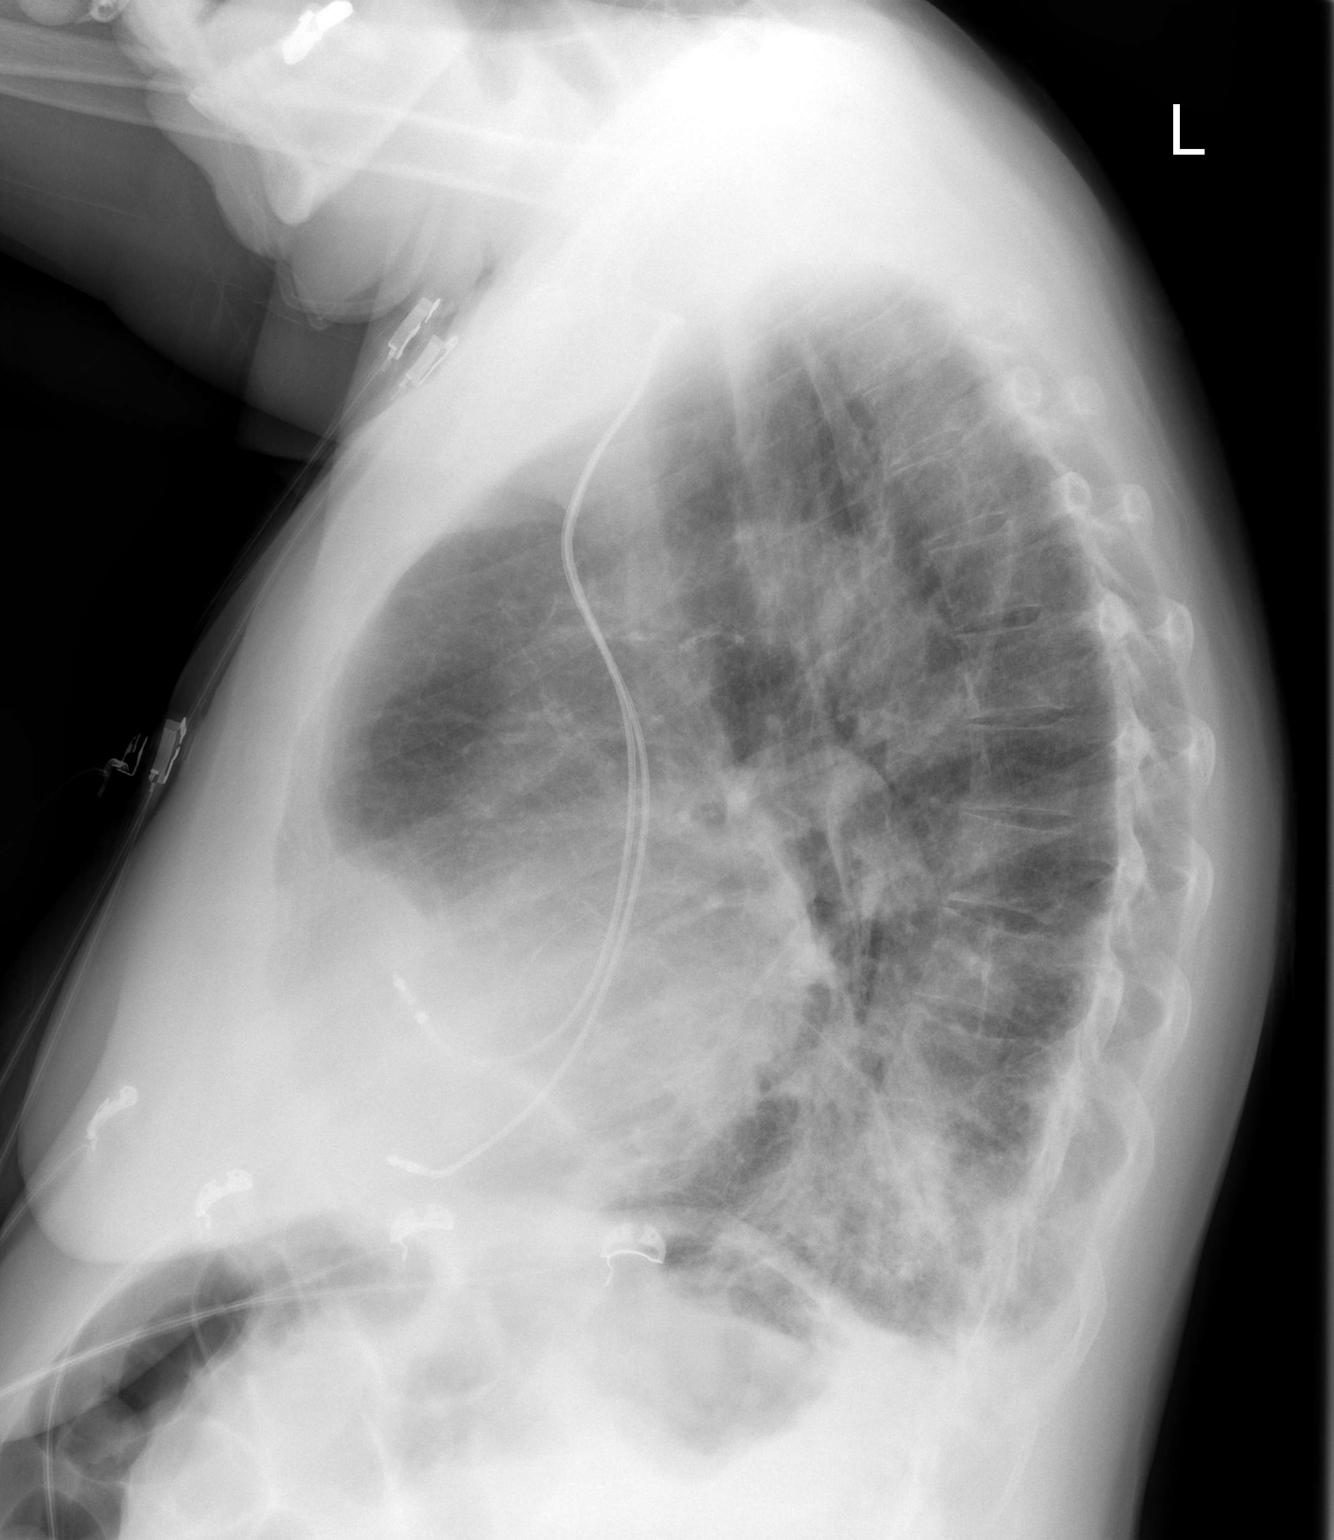

[2 of 2 positions shown; findings below may reference images not displayed]

FINDINGS: Left chest wall pacer device is noted with leads in the right atrial
appendage and right ventricle. Stable cardiomediastinal contours.
Small bilateral pleural effusions are identified and there is
pulmonary vascular congestion. Airspace disease is identified within
the right lower lobe. Atelectasis noted in the left lower lobe.
Remote healed right posterior 6th rib fracture.
IMPRESSION: 1. Suspect right lower lobe pneumonia.
2. Small bilateral pleural effusions and pulmonary vascular
congestion.

## 2022-01-23 IMAGING — DX DG CHEST 1V PORT
1 series · 1 of 1 positions shown · non-contrast
Comparison: 09/19/2021

CLINICAL DATA: Nausea, chest pain [DATE], COPD, tobacco abuse

EXAM:
PORTABLE CHEST 1 VIEW

[chest ap]
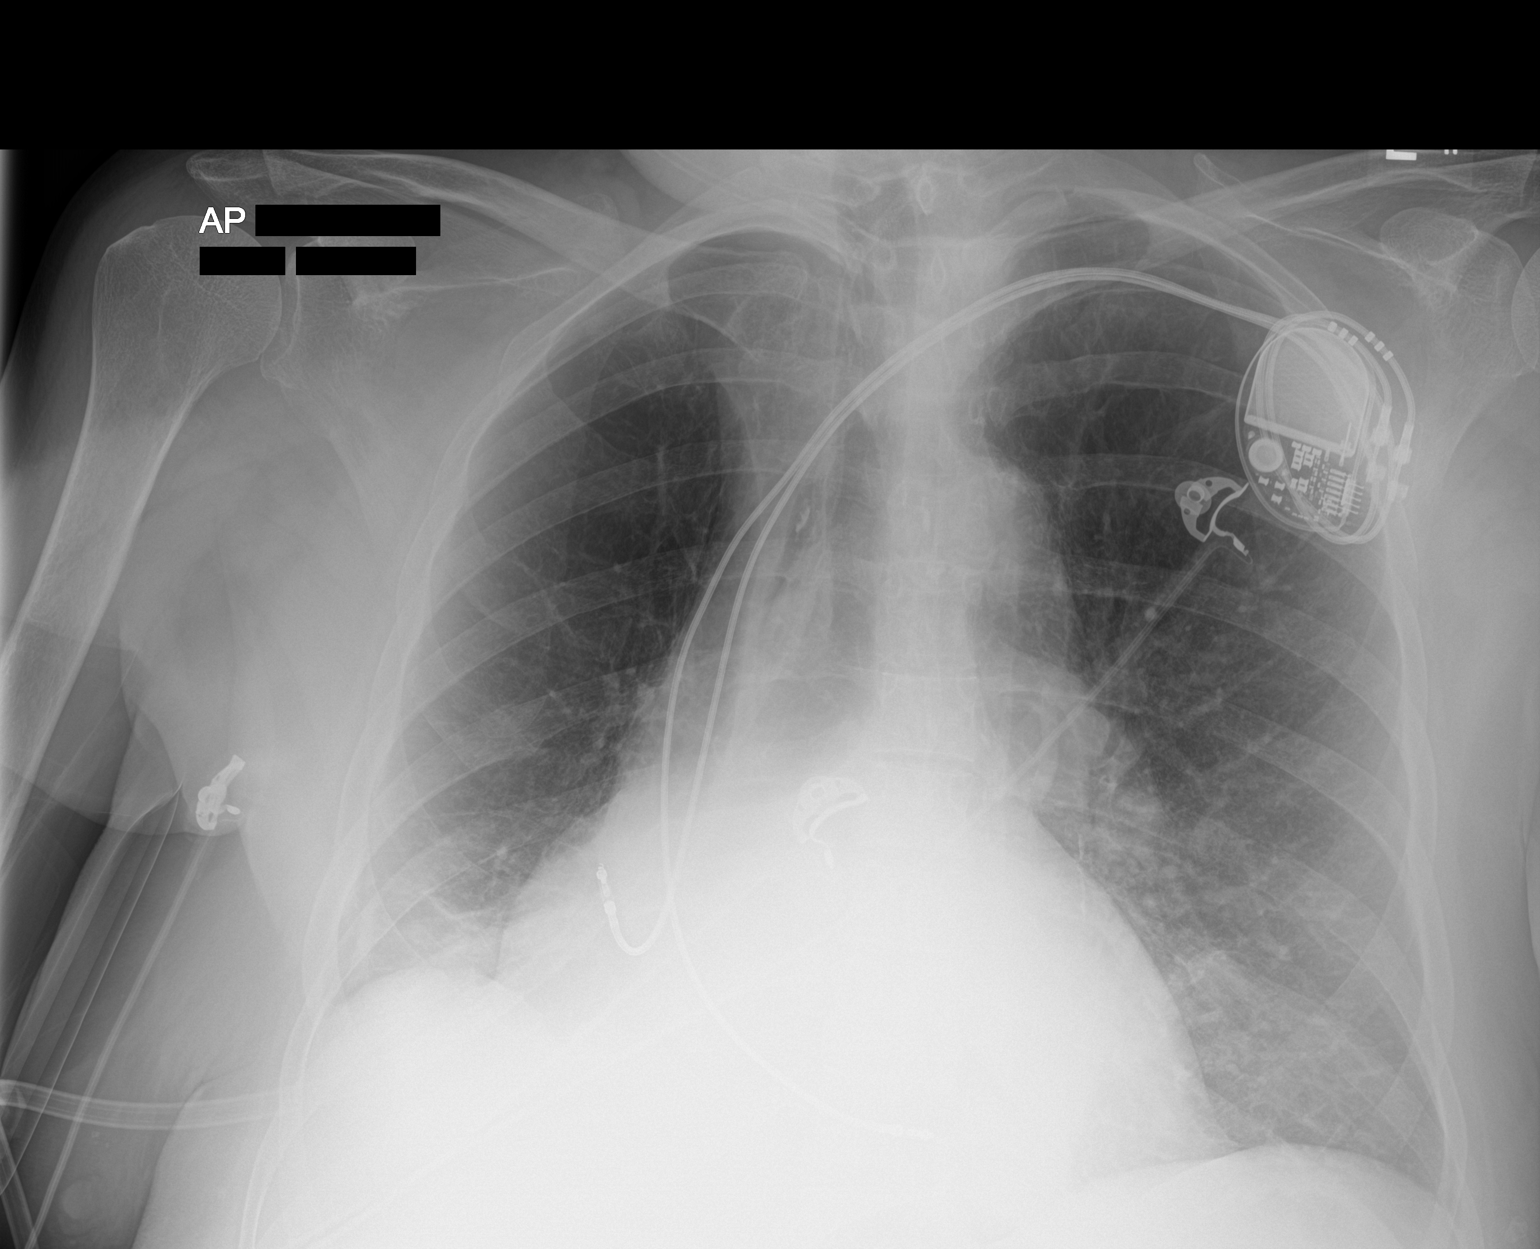

[1 of 1 positions shown; findings below may reference images not displayed]

FINDINGS: Single frontal view of the chest demonstrates stable dual lead
pacer. Cardiac silhouette is mildly enlarged but stable. Persistent
consolidation at the medial right lung base. No large effusion. No
pneumothorax. Left chest is clear. No acute bony abnormalities.
IMPRESSION: 1. Stable right basilar consolidation compatible with pneumonia.

## 2022-10-09 ENCOUNTER — Telehealth: Payer: Self-pay | Admitting: Family

## 2022-10-09 NOTE — Telephone Encounter (Signed)
Copied from [place] [phone]. Topic: Medicare AWV >> Oct 09, 2022  2:43 PM Devoria Glassing wrote: Reason for CRM: Left message for patient to schedule Annual Wellness Visit.  Please schedule with Health Nurse Advisor at [facility]. Call Perry at [phone]

## 2022-10-21 ENCOUNTER — Telehealth: Payer: Self-pay | Admitting: Family

## 2022-10-21 NOTE — Telephone Encounter (Signed)
Copied from [place] [phone]. Topic: Medicare AWV >> Oct 21, 2022  2:16 PM Devoria Glassing wrote: Reason for CRM: Left message for patient to schedule Annual Wellness Visit(AWV).  Please schedule with Health Nurse Advisor at [facility]. Please call [phone] ask for [facility].
